# Patient Record
Sex: Female | Born: 1980 | Race: White | Hispanic: No | Marital: Married | Smoking: Current every day smoker
Health system: Southern US, Community
[De-identification: ages and names within clinical notes are randomized; demographics above are authoritative.]

## PROBLEM LIST (undated history)

## (undated) ENCOUNTER — Inpatient Hospital Stay (HOSPITAL_COMMUNITY): Payer: Self-pay

## (undated) DIAGNOSIS — D649 Anemia, unspecified: Secondary | ICD-10-CM

## (undated) DIAGNOSIS — C8590 Non-Hodgkin lymphoma, unspecified, unspecified site: Secondary | ICD-10-CM

## (undated) DIAGNOSIS — C8190 Hodgkin lymphoma, unspecified, unspecified site: Secondary | ICD-10-CM

## (undated) DIAGNOSIS — C801 Malignant (primary) neoplasm, unspecified: Secondary | ICD-10-CM

## (undated) DIAGNOSIS — R635 Abnormal weight gain: Secondary | ICD-10-CM

## (undated) DIAGNOSIS — N390 Urinary tract infection, site not specified: Secondary | ICD-10-CM

## (undated) DIAGNOSIS — O99213 Obesity complicating pregnancy, third trimester: Secondary | ICD-10-CM

## (undated) DIAGNOSIS — Z5189 Encounter for other specified aftercare: Secondary | ICD-10-CM

## (undated) DIAGNOSIS — R011 Cardiac murmur, unspecified: Secondary | ICD-10-CM

## (undated) DIAGNOSIS — R1310 Dysphagia, unspecified: Secondary | ICD-10-CM

## (undated) DIAGNOSIS — O139 Gestational [pregnancy-induced] hypertension without significant proteinuria, unspecified trimester: Secondary | ICD-10-CM

## (undated) HISTORY — PX: CHOLECYSTECTOMY: SHX55

## (undated) HISTORY — DX: Abnormal weight gain: R63.5

## (undated) HISTORY — DX: Anemia, unspecified: D64.9

## (undated) HISTORY — PX: RHINOPLASTY: SUR1284

## (undated) HISTORY — PX: APPENDECTOMY: SHX54

## (undated) HISTORY — PX: OTHER SURGICAL HISTORY: SHX169

## (undated) HISTORY — DX: Dysphagia, unspecified: R13.10

## (undated) HISTORY — DX: Non-Hodgkin lymphoma, unspecified, unspecified site: C85.90

## (undated) HISTORY — PX: PORT-A-CATH REMOVAL: SHX5289

## (undated) HISTORY — PX: TONSILLECTOMY: SUR1361

## (undated) HISTORY — PX: CHEST WALL TUMOR EXCISION: SUR562

---

## 2002-06-11 ENCOUNTER — Ambulatory Visit (HOSPITAL_COMMUNITY): Admission: AD | Admit: 2002-06-11 | Discharge: 2002-06-12 | Payer: Self-pay | Admitting: Obstetrics & Gynecology

## 2008-02-10 HISTORY — PX: WISDOM TOOTH EXTRACTION: SHX21

## 2008-11-24 DIAGNOSIS — C8190 Hodgkin lymphoma, unspecified, unspecified site: Secondary | ICD-10-CM

## 2008-11-24 HISTORY — DX: Hodgkin lymphoma, unspecified, unspecified site: C81.90

## 2009-01-25 DIAGNOSIS — Z5189 Encounter for other specified aftercare: Secondary | ICD-10-CM

## 2009-01-25 DIAGNOSIS — IMO0001 Reserved for inherently not codable concepts without codable children: Secondary | ICD-10-CM

## 2009-01-25 HISTORY — DX: Reserved for inherently not codable concepts without codable children: IMO0001

## 2009-01-25 HISTORY — DX: Encounter for other specified aftercare: Z51.89

## 2009-04-29 ENCOUNTER — Other Ambulatory Visit: Payer: Self-pay

## 2010-02-09 DIAGNOSIS — C8590 Non-Hodgkin lymphoma, unspecified, unspecified site: Secondary | ICD-10-CM

## 2010-02-09 HISTORY — DX: Non-Hodgkin lymphoma, unspecified, unspecified site: C85.90

## 2010-05-14 ENCOUNTER — Inpatient Hospital Stay (HOSPITAL_COMMUNITY)
Admission: AD | Admit: 2010-05-14 | Discharge: 2010-05-15 | Disposition: A | Discharge status: Home / Self Care | Payer: Medicaid Other | Source: Ambulatory Visit | Attending: Obstetrics & Gynecology | Admitting: Obstetrics & Gynecology

## 2010-05-14 DIAGNOSIS — O99891 Other specified diseases and conditions complicating pregnancy: Secondary | ICD-10-CM | POA: Insufficient documentation

## 2010-05-14 DIAGNOSIS — R109 Unspecified abdominal pain: Secondary | ICD-10-CM | POA: Insufficient documentation

## 2010-06-24 ENCOUNTER — Inpatient Hospital Stay (HOSPITAL_COMMUNITY)
Admission: AD | Admit: 2010-06-24 | Discharge: 2010-06-25 | Disposition: A | Discharge status: Home / Self Care | Payer: Medicaid Other | Source: Ambulatory Visit | Attending: Obstetrics and Gynecology | Admitting: Obstetrics and Gynecology

## 2010-06-24 DIAGNOSIS — O4700 False labor before 37 completed weeks of gestation, unspecified trimester: Secondary | ICD-10-CM | POA: Insufficient documentation

## 2011-01-12 ENCOUNTER — Inpatient Hospital Stay (HOSPITAL_COMMUNITY): Payer: Self-pay

## 2011-01-12 ENCOUNTER — Encounter (HOSPITAL_COMMUNITY): Payer: Self-pay

## 2011-01-12 ENCOUNTER — Inpatient Hospital Stay (HOSPITAL_COMMUNITY)
Admission: AD | Admit: 2011-01-12 | Discharge: 2011-01-12 | Disposition: A | Discharge status: Home / Self Care | Payer: Self-pay | Source: Ambulatory Visit | Attending: Obstetrics & Gynecology | Admitting: Obstetrics & Gynecology

## 2011-01-12 DIAGNOSIS — O200 Threatened abortion: Secondary | ICD-10-CM | POA: Insufficient documentation

## 2011-01-12 HISTORY — DX: Encounter for other specified aftercare: Z51.89

## 2011-01-12 HISTORY — DX: Non-Hodgkin lymphoma, unspecified, unspecified site: C85.90

## 2011-01-12 LAB — COMPREHENSIVE METABOLIC PANEL
ALT: 16 U/L (ref 0–35)
BUN: 11 mg/dL (ref 6–23)
CO2: 21 mEq/L (ref 19–32)
Calcium: 9.5 mg/dL (ref 8.4–10.5)
Creatinine, Ser: 0.56 mg/dL (ref 0.50–1.10)
GFR calc Af Amer: >90 mL/min (ref >90)
GFR calc non Af Amer: >90 mL/min (ref >90)
Glucose, Bld: 81 mg/dL (ref 70–99)

## 2011-01-12 LAB — WET PREP, GENITAL
Trich, Wet Prep: NONE SEEN
Yeast Wet Prep HPF POC: NONE SEEN

## 2011-01-12 LAB — URINALYSIS, ROUTINE W REFLEX MICROSCOPIC
Glucose, UA: NEGATIVE mg/dL
Ketones, ur: NEGATIVE mg/dL
Protein, ur: NEGATIVE mg/dL
Urobilinogen, UA: 0.2 mg/dL (ref 0.0–1.0)

## 2011-01-12 LAB — URINE MICROSCOPIC-ADD ON

## 2011-01-12 LAB — CBC
HCT: 33.9 % — ABNORMAL LOW (ref 36.0–46.0)
MCH: 26.6 pg (ref 26.0–34.0)
MCHC: 32.2 g/dL (ref 30.0–36.0)
MCV: 82.7 fL (ref 78.0–100.0)
RDW: 16.8 % — ABNORMAL HIGH (ref 11.5–15.5)

## 2011-01-12 MED ORDER — OXYCODONE-ACETAMINOPHEN 5-325 MG PO TABS: 2.0000 | ORAL_TABLET | Freq: Once | ORAL | Status: AC

## 2011-01-12 MED ORDER — OXYCODONE-ACETAMINOPHEN 5-325 MG PO TABS
2.0000 | ORAL_TABLET | Freq: Once | ORAL | Status: AC
  Administered 2011-01-12: 2 via ORAL
  Filled 2011-01-12: qty 2

## 2011-01-12 NOTE — Progress Notes (Signed)
Pt states abd pain started at 1200, generalized, back pain noted for 2 weeks, urine has foul odor, denies uti s/s. Told had elevated liver function tests. Hx Non-Hodgkin's Lymphoma.

## 2011-01-12 NOTE — ED Provider Notes (Signed)
History     Chief Complaint  Patient presents with  . Abdominal Pain   HPI 30 y.o. [id number] at [redacted]w[redacted]d. Abd pain - starts in upper abdomen and radiates around bilaterally to low abdomen, started at noon today, low back pain x 1 week. No vaginal bleeding, + mucous discharge. + nausea since yesterday. Has been seen by Dr. Shawnie Pons in [facility], states he has seen a gestational sac with yolk sac on u/s, has f/u scheduled for repeat u/s.     Past Medical History  Diagnosis Date  . Non-Hodgkin lymphoma   . Blood transfusion 01/25/2009    Past Surgical History  Procedure Date  . Appendectomy   . Tonsillectomy   . Cholecystectomy   . Rhinoplasty     No family history on file.  History  Substance Use Topics  . Smoking status: Current Everyday Smoker  . Smokeless tobacco: Not on file  . Alcohol Use: No    Allergies: No Known Allergies  Prescriptions prior to admission  Medication Sig Dispense Refill  . acetaminophen (TYLENOL) 325 MG tablet Take by mouth every 6 (six) hours as needed. Patient used this medication for pain.         Review of Systems  Constitutional: Negative.   Respiratory: Negative.   Cardiovascular: Negative.   Gastrointestinal: Positive for nausea, vomiting and abdominal pain. Negative for diarrhea and constipation.  Genitourinary: Negative for dysuria, urgency, frequency, hematuria and flank pain.       Negative for vaginal bleeding, Positive for vaginal discharge   Musculoskeletal: Negative.   Neurological: Negative.   Psychiatric/Behavioral: Negative.    Physical Exam   Blood pressure 134/70, pulse 81, temperature 98.2 F (36.8 C), temperature source Oral, resp. rate 16, height 5\' 2"  (1.575 m), weight 121.791 kg (268 lb 8 oz), last menstrual period 11/14/2010.  Physical Exam  Nursing note and vitals reviewed. Constitutional: She is oriented to person, place, and time. She appears well-developed and well-nourished. No distress.  HENT:  Head:  Normocephalic and atraumatic.  Cardiovascular: Normal rate, regular rhythm and normal heart sounds.   Respiratory: Effort normal and breath sounds normal. No respiratory distress.  GI: Soft. Bowel sounds are normal. She exhibits no distension and no mass. There is tenderness (diffuse). There is no rebound and no guarding.  Genitourinary: There is no rash or lesion on the right labia. There is no rash or lesion on the left labia. No erythema, tenderness or bleeding around the vagina. Vaginal discharge (white) found.       Exam limited by body habitus  Neurological: She is alert and oriented to person, place, and time.  Skin: Skin is warm and dry.  Psychiatric: She has a normal mood and affect.    MAU Course  Procedures  Results for orders placed during the hospital encounter of 01/12/11 (from the past 24 hour(s))  URINALYSIS, ROUTINE W REFLEX MICROSCOPIC     Status: Abnormal   Collection Time   01/12/11  5:11 PM      Component Value Range   Color, Urine YELLOW  YELLOW    APPearance HAZY (*) CLEAR    Specific Gravity, Urine >1.030 (*) 1.005 - 1.030    pH 6.0  5.0 - 8.0    Glucose, UA NEGATIVE  NEGATIVE (mg/dL)   Hgb urine dipstick SMALL (*) NEGATIVE    Bilirubin Urine NEGATIVE  NEGATIVE    Ketones, ur NEGATIVE  NEGATIVE (mg/dL)   Protein, ur NEGATIVE  NEGATIVE (mg/dL)   Urobilinogen, UA  0.2  0.0 - 1.0 (mg/dL)   Nitrite NEGATIVE  NEGATIVE    Leukocytes, UA TRACE (*) NEGATIVE   URINE MICROSCOPIC-ADD ON     Status: Abnormal   Collection Time   01/12/11  5:11 PM      Component Value Range   Squamous Epithelial / LPF MANY (*) RARE    WBC, UA 7-10  <3 (WBC/hpf)   Bacteria, UA FEW (*) RARE    Urine-Other MUCOUS PRESENT    POCT PREGNANCY, URINE     Status: Normal   Collection Time   01/12/11  6:01 PM      Component Value Range   Preg Test, Ur POSITIVE    WET PREP, GENITAL     Status: Abnormal   Collection Time   01/12/11  6:27 PM      Component Value Range   Yeast, Wet Prep NONE  SEEN  NONE SEEN    Trich, Wet Prep NONE SEEN  NONE SEEN    Clue Cells, Wet Prep NONE SEEN  NONE SEEN    WBC, Wet Prep HPF POC FEW (*) NONE SEEN   CBC     Status: Abnormal   Collection Time   01/12/11  7:12 PM      Component Value Range   WBC 9.4  4.0 - 10.5 (K/uL)   RBC 4.10  3.87 - 5.11 (MIL/uL)   Hemoglobin 10.9 (*) 12.0 - 15.0 (g/dL)   HCT 40.9 (*) 81.1 - 46.0 (%)   MCV 82.7  78.0 - 100.0 (fL)   MCH 26.6  26.0 - 34.0 (pg)   MCHC 32.2  30.0 - 36.0 (g/dL)   RDW 91.4 (*) 78.2 - 15.5 (%)   Platelets 335  150 - 400 (K/uL)  ABO/RH     Status: Normal   Collection Time   01/12/11  7:12 PM      Component Value Range   ABO/RH(D) A POS    HCG, QUANTITATIVE, PREGNANCY     Status: Abnormal   Collection Time   01/12/11  7:12 PM      Component Value Range   hCG, Beta Chain, Quant, S 95621 (*) <5 (mIU/mL)  COMPREHENSIVE METABOLIC PANEL     Status: Normal   Collection Time   01/12/11  7:12 PM      Component Value Range   Sodium 135  135 - 145 (mEq/L)   Potassium 3.6  3.5 - 5.1 (mEq/L)   Chloride 101  96 - 112 (mEq/L)   CO2 21  19 - 32 (mEq/L)   Glucose, Bld 81  70 - 99 (mg/dL)   BUN 11  6 - 23 (mg/dL)   Creatinine, Ser 3.08  0.50 - 1.10 (mg/dL)   Calcium 9.5  8.4 - 65.7 (mg/dL)   Total Protein 7.4  6.0 - 8.3 (g/dL)   Albumin 3.9  3.5 - 5.2 (g/dL)   AST 15  0 - 37 (U/L)   ALT 16  0 - 35 (U/L)   Alkaline Phosphatase 78  39 - 117 (U/L)   Total Bilirubin 0.3  0.3 - 1.2 (mg/dL)   GFR calc non Af Amer >90  >90 (mL/min)   GFR calc Af Amer >90  >90 (mL/min)   US Ob Comp Less 14 Wks  01/12/2011  *RADIOLOGY REPORT*  Clinical Data: Abdominal pain.  8 weeks and 3 days pregnant by last menstrual period.  No quantitative beta HCG available at this time. 7 weeks and 5 days pregnant by previous outside ultrasound.  OBSTETRIC <14 WK [place] AND TRANSVAGINAL OB US  Technique:  Both transabdominal and transvaginal ultrasound examinations were performed for complete evaluation of the gestation as well as the  maternal uterus, adnexal regions, and pelvic cul-de-sac.  Transvaginal technique was performed to assess early pregnancy.  Comparison:  None.  Intrauterine gestational sac:  Visualized/normal in shape. Yolk sac: Small amount of debris within the gestational sac without a definite yolk sac visualized. Embryo: Small amount of debris within the gestational sac without a definitive fetal pole. Cardiac Activity: Not visualized.  MSD: 25.8  mm  7    w 5    d        [place] EDC: 08/26/2011  Maternal uterus/adnexae: Normal appearing maternal ovaries.  No subchorionic hemorrhage seen.  No free peritoneal fluid.  IMPRESSION: Intrauterine gestational sac containing a small amount of debris without a definitive fetal pole or yolk sac.  The estimated gestational age by sac diameter is 7 weeks and 5 days, which correlates with the gestational age by previous outside ultrasound. These findings are most compatible with fetal demise.  A non- visualized ectopic pregnancy is unlikely.  Follow-up serial quantitative beta HCG determinations is recommended.  If these rise, a follow-up pelvic ultrasound would be recommended in 2 weeks.  Original Report Authenticated By: Darrol Angel, M.D.   US Ob Transvaginal  01/12/2011  *RADIOLOGY REPORT*  Clinical Data: Abdominal pain.  8 weeks and 3 days pregnant by last menstrual period.  No quantitative beta HCG available at this time. 7 weeks and 5 days pregnant by previous outside ultrasound.  OBSTETRIC <14 WK [place] AND TRANSVAGINAL OB US  Technique:  Both transabdominal and transvaginal ultrasound examinations were performed for complete evaluation of the gestation as well as the maternal uterus, adnexal regions, and pelvic cul-de-sac.  Transvaginal technique was performed to assess early pregnancy.  Comparison:  None.  Intrauterine gestational sac:  Visualized/normal in shape. Yolk sac: Small amount of debris within the gestational sac without a definite yolk sac visualized. Embryo: Small amount of  debris within the gestational sac without a definitive fetal pole. Cardiac Activity: Not visualized.  MSD: 25.8  mm  7    w 5    d        [place] EDC: 08/26/2011  Maternal uterus/adnexae: Normal appearing maternal ovaries.  No subchorionic hemorrhage seen.  No free peritoneal fluid.  IMPRESSION: Intrauterine gestational sac containing a small amount of debris without a definitive fetal pole or yolk sac.  The estimated gestational age by sac diameter is 7 weeks and 5 days, which correlates with the gestational age by previous outside ultrasound. These findings are most compatible with fetal demise.  A non- visualized ectopic pregnancy is unlikely.  Follow-up serial quantitative beta HCG determinations is recommended.  If these rise, a follow-up pelvic ultrasound would be recommended in 2 weeks.  Original Report Authenticated By: Darrol Angel, M.D.   Assessment and Plan  30 y.o. [id number] at [redacted]w[redacted]d Threatened miscarriage - pt has follow up scheduled on Wednesday with Dr. Shawnie Pons, she desires to keep this appointment for f/u, will return here with excessive pain or bleeding   Langford Carias 01/12/2011, 8:00 PM

## 2011-01-21 NOTE — ED Provider Notes (Signed)
Agree with above note.  Cynthia Evelyn H. 01/21/2011 12:45 PM  

## 2011-05-17 ENCOUNTER — Emergency Department (HOSPITAL_COMMUNITY): Payer: Medicaid Other

## 2011-05-17 ENCOUNTER — Other Ambulatory Visit: Payer: Self-pay

## 2011-05-17 ENCOUNTER — Encounter (HOSPITAL_COMMUNITY): Payer: Self-pay | Admitting: *Deleted

## 2011-05-17 ENCOUNTER — Emergency Department (HOSPITAL_COMMUNITY)
Admission: EM | Admit: 2011-05-17 | Discharge: 2011-05-17 | Disposition: A | Discharge status: Home / Self Care | Payer: Medicaid Other | Attending: Emergency Medicine | Admitting: Emergency Medicine

## 2011-05-17 DIAGNOSIS — O2690 Pregnancy related conditions, unspecified, unspecified trimester: Secondary | ICD-10-CM | POA: Insufficient documentation

## 2011-05-17 DIAGNOSIS — R079 Chest pain, unspecified: Secondary | ICD-10-CM | POA: Insufficient documentation

## 2011-05-17 DIAGNOSIS — Z3201 Encounter for pregnancy test, result positive: Secondary | ICD-10-CM

## 2011-05-17 LAB — CBC
MCV: 80 fL (ref 78.0–100.0)
Platelets: 327 10*3/uL (ref 150–400)
RDW: 16.3 % — ABNORMAL HIGH (ref 11.5–15.5)
WBC: 8.4 10*3/uL (ref 4.0–10.5)

## 2011-05-17 LAB — URINALYSIS, ROUTINE W REFLEX MICROSCOPIC
Hgb urine dipstick: NEGATIVE
Protein, ur: NEGATIVE mg/dL
Urobilinogen, UA: 1 mg/dL (ref 0.0–1.0)

## 2011-05-17 LAB — BASIC METABOLIC PANEL
CO2: 25 mEq/L (ref 19–32)
Calcium: 8.9 mg/dL (ref 8.4–10.5)
GFR calc Af Amer: >90 mL/min (ref >90)
GFR calc non Af Amer: >90 mL/min (ref >90)
Sodium: 138 mEq/L (ref 135–145)

## 2011-05-17 LAB — DIFFERENTIAL
Basophils Absolute: 0 10*3/uL (ref 0.0–0.1)
Eosinophils Absolute: 0.2 10*3/uL (ref 0.0–0.7)
Eosinophils Relative: 2 % (ref 0–5)
Lymphocytes Relative: 26 % (ref 12–46)

## 2011-05-17 LAB — URINE MICROSCOPIC-ADD ON

## 2011-05-17 LAB — POCT I-STAT TROPONIN I: Troponin i, poc: 0 ng/mL (ref 0.00–0.08)

## 2011-05-17 LAB — PREGNANCY, URINE: Preg Test, Ur: POSITIVE — AB

## 2011-05-17 MED ORDER — HYDROCODONE-ACETAMINOPHEN 5-325 MG PO TABS: 1.0000 | ORAL_TABLET | Freq: Four times a day (QID) | ORAL | Status: AC | PRN

## 2011-05-17 MED ORDER — ONDANSETRON HCL 4 MG/2ML IJ SOLN
4.0000 mg | Freq: Once | INTRAMUSCULAR | Status: AC
  Administered 2011-05-17: 4 mg via INTRAVENOUS
  Filled 2011-05-17: qty 2

## 2011-05-17 MED ORDER — SODIUM CHLORIDE 0.9 % IV SOLN
Freq: Once | INTRAVENOUS | Status: AC
  Administered 2011-05-17: 20 mL/h via INTRAVENOUS

## 2011-05-17 MED ORDER — TECHNETIUM TO 99M ALBUMIN AGGREGATED
3.0000 | Freq: Once | INTRAVENOUS | Status: AC | PRN
  Administered 2011-05-17: 3 via INTRAVENOUS

## 2011-05-17 MED ORDER — FENTANYL CITRATE 0.05 MG/ML IJ SOLN
50.0000 ug | Freq: Once | INTRAMUSCULAR | Status: AC
  Administered 2011-05-17: 50 ug via INTRAVENOUS
  Filled 2011-05-17: qty 2

## 2011-05-17 NOTE — Discharge Instructions (Signed)
Chest Pain (Nonspecific) It is often hard to give a specific diagnosis for the cause of chest pain. There is always a chance that your pain could be related to something serious, such as a heart attack or a blood clot in the lungs. You need to follow up with your caregiver for further evaluation. CAUSES   Heartburn.   Pneumonia or bronchitis.   Anxiety or stress.   Inflammation around your heart (pericarditis) or lung (pleuritis or pleurisy).   A blood clot in the lung.   A collapsed lung (pneumothorax). It can develop suddenly on its own (spontaneous pneumothorax) or from injury (trauma) to the chest.   Shingles infection (herpes zoster virus).  The chest wall is composed of bones, muscles, and cartilage. Any of these can be the source of the pain.  The bones can be bruised by injury.   The muscles or cartilage can be strained by coughing or overwork.   The cartilage can be affected by inflammation and become sore (costochondritis).  DIAGNOSIS  Lab tests or other studies, such as X-rays, electrocardiography, stress testing, or cardiac imaging, may be needed to find the cause of your pain.  TREATMENT   Treatment depends on what may be causing your chest pain. Treatment may include:   Acid blockers for heartburn.   Anti-inflammatory medicine.   Pain medicine for inflammatory conditions.   Antibiotics if an infection is present.   You may be advised to change lifestyle habits. This includes stopping smoking and avoiding alcohol, caffeine, and chocolate.   You may be advised to keep your head raised (elevated) when sleeping. This reduces the chance of acid going backward from your stomach into your esophagus.   Most of the time, nonspecific chest pain will improve within 2 to 3 days with rest and mild pain medicine.  HOME CARE INSTRUCTIONS   If antibiotics were prescribed, take your antibiotics as directed. Finish them even if you start to feel better.   For the next few  days, avoid physical activities that bring on chest pain. Continue physical activities as directed.   Do not smoke.   Avoid drinking alcohol.   Only take over-the-counter or prescription medicine for pain, discomfort, or fever as directed by your caregiver.   Follow your caregiver's suggestions for further testing if your chest pain does not go away.   Keep any follow-up appointments you made. If you do not go to an appointment, you could develop lasting (chronic) problems with pain. If there is any problem keeping an appointment, you must call to reschedule.  SEEK MEDICAL CARE IF:   You think you are having problems from the medicine you are taking. Read your medicine instructions carefully.   Your chest pain does not go away, even after treatment.   You develop a rash with blisters on your chest.  SEEK IMMEDIATE MEDICAL CARE IF:   You have increased chest pain or pain that spreads to your arm, neck, jaw, back, or abdomen.   You develop shortness of breath, an increasing cough, or you are coughing up blood.   You have severe back or abdominal pain, feel nauseous, or vomit.   You develop severe weakness, fainting, or chills.   You have a fever.  THIS IS AN EMERGENCY. Do not wait to see if the pain will go away. Get medical help at once. Call your local emergency services (911 in [place] Do not drive yourself to the hospital. MAKE SURE YOU:   Understand these instructions.     Will watch your condition.   Will get help right away if you are not doing well or get worse.  Document Released: 11/05/2004 Document Revised: 01/15/2011 Document Reviewed: 09/01/2007 [employer] Patient Information 2012 [employer]. 

## 2011-05-17 NOTE — ED Provider Notes (Signed)
History     CSN: [id number]  Arrival date & time 05/17/11  1100   First MD Initiated Contact with Patient 05/17/11 1203      12:22 PM HPI Patient reports chest pain that has been progressive for 4-5 months. States pain feels like prior "cancer" pain. States 11/24/08 started Chemo but had to stop in 07/2009 because she became pregnant, (just prior to radiation therapy). Reports symptoms are associated with diarrhea, extreme fatique, SOB, intermittent claudication and nausea. Denies weight loss, abdominal pain, back pain, vomiting, bleeding. Reports that she use to follow Dr. Keane Scrape with [facility].   Patient is a 31 y.o. female presenting with chest pain. The history is provided by the patient.  Chest Pain Episode onset: 4-5 months. Chest pain occurs constantly. The chest pain is worsening. The pain is currently at 8/10. The severity of the pain is severe. The quality of the pain is described as similar to previous episodes. The pain does not radiate. Primary symptoms include fatigue, shortness of breath and nausea. Pertinent negatives for primary symptoms include no fever, no syncope, no cough, no wheezing, no palpitations, no abdominal pain, no vomiting, no dizziness and no altered mental status.  Pertinent negatives for associated symptoms include no diaphoresis, no numbness and no weakness. Risk factors include obesity.  Her past medical history is significant for cancer, DVT and PE.  Pertinent negatives for past medical history include no hyperlipidemia, no hypertension, no MI and no TIA.     Past Medical History  Diagnosis Date  . Non-Hodgkin lymphoma   . Blood transfusion 01/25/2009  . Hypertension     Past Surgical History  Procedure Date  . Appendectomy   . Tonsillectomy   . Cholecystectomy   . Rhinoplasty   . Port-a-cath removal   . Chest wall tumor excision     No family history on file.  History  Substance Use Topics  . Smoking status: Current Everyday Smoker  .  Smokeless tobacco: Not on file  . Alcohol Use: No    OB History    Grav Para Term Preterm Abortions TAB SAB Ect Mult Living   6 5 5  0 0 0 0 0 0 5      Review of Systems  Constitutional: Positive for fatigue. Negative for fever, diaphoresis and unexpected weight change.  HENT: Negative for neck pain.   Respiratory: Positive for shortness of breath. Negative for cough, chest tightness and wheezing.   Cardiovascular: Positive for chest pain. Negative for palpitations, leg swelling and syncope.  Gastrointestinal: Positive for nausea and diarrhea. Negative for vomiting and abdominal pain.  Neurological: Negative for dizziness, speech difficulty, weakness, numbness and headaches.  Psychiatric/Behavioral: Negative for altered mental status.  All other systems reviewed and are negative.    Allergies  Review of patient's allergies indicates no known allergies.  Home Medications  No current outpatient prescriptions on file.  BP 114/50  Pulse 71  Temp(Src) 98.1 F (36.7 C) (Oral)  Resp 16  SpO2 99%  LMP 04/12/2011  Breastfeeding? Unknown  Physical Exam  Vitals reviewed. Constitutional: She is oriented to person, place, and time. Vital signs are normal. She appears well-developed and well-nourished.  HENT:  Head: Normocephalic and atraumatic.  Eyes: Conjunctivae are normal. Pupils are equal, round, and reactive to light.  Neck: Normal range of motion. Neck supple.  Cardiovascular: Normal rate, regular rhythm and normal heart sounds.  Exam reveals no friction rub.   No murmur heard. Pulmonary/Chest: Effort normal and breath sounds normal. She  has no wheezes. She has no rhonchi. She has no rales. She exhibits no tenderness.  Abdominal: Soft. Bowel sounds are normal. She exhibits no distension and no mass. There is no tenderness. There is no rebound and no guarding.  Musculoskeletal: Normal range of motion.  Neurological: She is alert and oriented to person, place, and time.  Coordination normal.  Skin: Skin is warm and dry. No rash noted. No erythema. No pallor.    ED Course  Procedures  Results for orders placed during the hospital encounter of 05/17/11  PREGNANCY, URINE      Component Value Range   Preg Test, Ur POSITIVE (*) NEGATIVE   URINALYSIS, ROUTINE W REFLEX MICROSCOPIC      Component Value Range   Color, Urine YELLOW  YELLOW    APPearance CLOUDY (*) CLEAR    Specific Gravity, Urine 1.025  1.005 - 1.030    pH 7.0  5.0 - 8.0    Glucose, UA NEGATIVE  NEGATIVE (mg/dL)   Hgb urine dipstick NEGATIVE  NEGATIVE    Bilirubin Urine NEGATIVE  NEGATIVE    Ketones, ur NEGATIVE  NEGATIVE (mg/dL)   Protein, ur NEGATIVE  NEGATIVE (mg/dL)   Urobilinogen, UA 1.0  0.0 - 1.0 (mg/dL)   Nitrite NEGATIVE  NEGATIVE    Leukocytes, UA MODERATE (*) NEGATIVE   CBC      Component Value Range   WBC 8.4  4.0 - 10.5 (K/uL)   RBC 4.36  3.87 - 5.11 (MIL/uL)   Hemoglobin 11.0 (*) 12.0 - 15.0 (g/dL)   HCT 56.3 (*) 87.5 - 46.0 (%)   MCV 80.0  78.0 - 100.0 (fL)   MCH 25.2 (*) 26.0 - 34.0 (pg)   MCHC 31.5  30.0 - 36.0 (g/dL)   RDW 64.3 (*) 32.9 - 15.5 (%)   Platelets 327  150 - 400 (K/uL)  DIFFERENTIAL      Component Value Range   Neutrophils Relative 65  43 - 77 (%)   Neutro Abs 5.5  1.7 - 7.7 (K/uL)   Lymphocytes Relative 26  12 - 46 (%)   Lymphs Abs 2.2  0.7 - 4.0 (K/uL)   Monocytes Relative 7  3 - 12 (%)   Monocytes Absolute 0.6  0.1 - 1.0 (K/uL)   Eosinophils Relative 2  0 - 5 (%)   Eosinophils Absolute 0.2  0.0 - 0.7 (K/uL)   Basophils Relative 0  0 - 1 (%)   Basophils Absolute 0.0  0.0 - 0.1 (K/uL)  BASIC METABOLIC PANEL      Component Value Range   Sodium 138  135 - 145 (mEq/L)   Potassium 4.1  3.5 - 5.1 (mEq/L)   Chloride 104  96 - 112 (mEq/L)   CO2 25  19 - 32 (mEq/L)   Glucose, Bld 87  70 - 99 (mg/dL)   BUN 15  6 - 23 (mg/dL)   Creatinine, Ser 5.18  0.50 - 1.10 (mg/dL)   Calcium 8.9  8.4 - 84.1 (mg/dL)   GFR calc non Af Amer >90  >90 (mL/min)   GFR  calc Af Amer >90  >90 (mL/min)  HCG, QUANTITATIVE, PREGNANCY      Component Value Range   hCG, Beta Chain, Quant, S 91 (*) <5 (mIU/mL)  URINE MICROSCOPIC-ADD ON      Component Value Range   Squamous Epithelial / LPF FEW (*) RARE    WBC, UA 11-20  <3 (WBC/hpf)   Bacteria, UA MANY (*) RARE   POCT  I-STAT TROPONIN I      Component Value Range   Troponin i, poc 0.00  0.00 - 0.08 (ng/mL)   Comment 3            Dg Chest 2 View  05/17/2011  *RADIOLOGY REPORT*  Clinical Data: Chest pain, pregnant  CHEST - 2 VIEW  Comparison: None.  Findings: Cardiomediastinal silhouette is unremarkable.  No acute infiltrate or pleural effusion.  No pulmonary edema. Mild thoracic dextroscoliosis.  The patient's abdomen was double shielded for examination.  IMPRESSION: No active disease.  Original Report Authenticated By: Natasha Mead, M.D.    Date: 05/17/2011  Rate: 70  Rhythm: normal sinus rhythm  QRS Axis: normal  Intervals: normal  ST/T Wave abnormalities: normal  Conduction Disutrbances: none      MDM   VQ scan was normal. Advised close followup with OB/GYN since bHcG was 91. Also advised close followup with [facility] Dr. for further evaluation of Hodgkin's lymphoma. Provide patient with few tablets of hydrocodone for pain if needed. Patient agrees with plan and is ready for discharge.     Thomasene Lot, PA-C 05/17/11 1847

## 2011-05-17 NOTE — ED Provider Notes (Signed)
Medical screening examination/treatment/procedure(s) were conducted as a shared visit with non-physician practitioner(s) and myself.  I personally evaluated the patient during the encounter  The patient presents with complaints of chest pain.  She has complicated history of lymphoma and PE.  Pt states she was pregnant in February and had a miscarriage.  She was treated with methotrexate.  She did not know that she was pregnant again.  She has not been using protection.    I doubt that the serum pregnancy would be positive now with her miscarriage in February.  We will check a quant.  Continue her chest pain evaluation.  Celene Kras, MD 05/17/11 1357

## 2011-05-17 NOTE — ED Notes (Signed)
Pt reports she feels dehydrated. Pt has history of Hodgkin's Lymphoma and had to stop treatment for pregnancy. Pt reports not being treated for over a year and was going to return to treatment, but believes she may be pregnant again.  Pt reports chest tightness has been going on for a month, worse in the past few days.  Pt reports pain is intermittent and lasting for a few seconds, pain is a squeezing sensation and pt reports she feels like she has palpitations as well.  Pt reports multiple episodes a day.

## 2011-05-19 NOTE — ED Provider Notes (Signed)
Medical screening examination/treatment/procedure(s) were performed by non-physician practitioner and as supervising physician I was immediately available for consultation/collaboration.  Celene Kras, MD 05/19/11 [phone]

## 2011-05-27 ENCOUNTER — Emergency Department (HOSPITAL_COMMUNITY): Admission: EM | Admit: 2011-05-27 | Discharge: 2011-05-27 | Discharge status: Against Medical Advice | Payer: Self-pay

## 2011-05-27 NOTE — ED Notes (Signed)
Pt reports that she was told a few weeks ago that she was pregnant and began having spotting yesterday and bleeding more heavily today.

## 2011-05-27 NOTE — ED Notes (Signed)
Pt called for triage x2. No response. Not found in waiting room

## 2011-05-27 NOTE — ED Notes (Signed)
Pt called for triage x1 with no response. Not found in waiting area.

## 2011-07-01 ENCOUNTER — Inpatient Hospital Stay (HOSPITAL_COMMUNITY)
Admission: AD | Admit: 2011-07-01 | Discharge: 2011-07-01 | Disposition: A | Discharge status: Home / Self Care | Payer: Self-pay | Source: Ambulatory Visit | Attending: Obstetrics & Gynecology | Admitting: Obstetrics & Gynecology

## 2011-07-01 ENCOUNTER — Encounter (HOSPITAL_COMMUNITY): Payer: Self-pay | Admitting: *Deleted

## 2011-07-01 DIAGNOSIS — Z3202 Encounter for pregnancy test, result negative: Secondary | ICD-10-CM | POA: Insufficient documentation

## 2011-07-01 HISTORY — DX: Malignant (primary) neoplasm, unspecified: C80.1

## 2011-07-01 HISTORY — DX: Hodgkin lymphoma, unspecified, unspecified site: C81.90

## 2011-07-01 NOTE — MAU Provider Note (Signed)
  History     CSN: [id number]  Arrival date and time: 07/01/11 1030   First Provider Initiated Contact with Patient 07/01/11 1133      Chief Complaint  Patient presents with  . Possible Pregnancy   HPI Cynthia Scott 31 y.o. Comes to MAU as she had a positive home pregnancy test yesterday.  No pain.  No bleeding.  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  Usually sees Dr. Shawnie Pons in [facility].  Has lost her Medicaid.  OB History    Grav Para Term Preterm Abortions TAB SAB Ect Mult Living   9 5 5  4 4    5       Past Medical History  Diagnosis Date  . 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 Cancer     Past Surgical History  Procedure Date  . Tonsillectomy   . Wisdom tooth extraction 2010  . Appendectomy   . Cholecystectomy   . Tubes in ears   . Turmor in chest     Family History  Problem Relation Age of Onset  . Anesthesia problems Neg Hx     History  Substance Use Topics  . Smoking status: Current Some Day Smoker -- 0.5 packs/day  . Smokeless tobacco: Not on file  . Alcohol Use: No     pt states she stopped in 2006    Allergies: No Known Allergies  No prescriptions prior to admission    ROS Physical Exam   Blood pressure 114/69, pulse 97, temperature 98.3 F (36.8 C), temperature source Oral, resp. rate 18, height 5' 1.5" (1.562 m), weight 275 lb 3.2 oz (124.83 kg), last menstrual period 05/27/2011, SpO2 100.00%.  Physical Exam  Nursing note and vitals reviewed. Constitutional: She is oriented to person, place, and time. She appears well-developed and well-nourished.  HENT:  Head: Normocephalic.  Eyes: EOM are normal.  Neck: Neck supple.  Musculoskeletal: Normal range of motion.  Neurological: She is alert and oriented to person, place, and time.  Skin: Skin is warm and dry.  Psychiatric: She has a normal mood and affect.    MAU Course  Procedures  MDM Results for orders placed during the hospital encounter of 07/01/11  (from the past 24 hour(s))  POCT PREGNANCY, URINE     Status: Normal   Collection Time   07/01/11 10:51 AM      Component Value Range   Preg Test, Ur NEGATIVE  NEGATIVE     Assessment and Plan  Negative pregnancy test  Plan Follow up with Dr. Shawnie Pons Condoms always for intercourse Repeat pregnancy test in 2 weeks if no menses.  Cynthia Scott 07/01/2011, 11:38 AM

## 2011-07-01 NOTE — Discharge Instructions (Signed)
Repeat a pregnancy test in 2-4 weeks if no period.

## 2011-07-01 NOTE — MAU Note (Signed)
Patient states she has a history of having 4 miscarriages since November. 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 Had a home pregnancy test that was positive. Is currently having no problems.

## 2011-07-02 ENCOUNTER — Encounter (HOSPITAL_COMMUNITY): Payer: Self-pay | Admitting: *Deleted

## 2011-07-03 ENCOUNTER — Encounter (HOSPITAL_COMMUNITY): Payer: Self-pay | Admitting: *Deleted

## 2011-07-03 ENCOUNTER — Inpatient Hospital Stay (HOSPITAL_COMMUNITY): Payer: Medicaid Other

## 2011-07-03 ENCOUNTER — Inpatient Hospital Stay (HOSPITAL_COMMUNITY)
Admission: AD | Admit: 2011-07-03 | Discharge: 2011-07-04 | Disposition: A | Discharge status: Home / Self Care | Payer: Medicaid Other | Source: Ambulatory Visit | Attending: Obstetrics & Gynecology | Admitting: Obstetrics & Gynecology

## 2011-07-03 DIAGNOSIS — O99891 Other specified diseases and conditions complicating pregnancy: Secondary | ICD-10-CM | POA: Insufficient documentation

## 2011-07-03 DIAGNOSIS — M549 Dorsalgia, unspecified: Secondary | ICD-10-CM | POA: Insufficient documentation

## 2011-07-03 DIAGNOSIS — R109 Unspecified abdominal pain: Secondary | ICD-10-CM | POA: Insufficient documentation

## 2011-07-03 DIAGNOSIS — O26899 Other specified pregnancy related conditions, unspecified trimester: Secondary | ICD-10-CM

## 2011-07-03 HISTORY — DX: Cardiac murmur, unspecified: R01.1

## 2011-07-03 LAB — CBC
MCH: 25.7 pg — ABNORMAL LOW (ref 26.0–34.0)
Platelets: 313 10*3/uL (ref 150–400)
RBC: 4.35 MIL/uL (ref 3.87–5.11)
RDW: 18 % — ABNORMAL HIGH (ref 11.5–15.5)
WBC: 11 10*3/uL — ABNORMAL HIGH (ref 4.0–10.5)

## 2011-07-03 LAB — URINALYSIS, ROUTINE W REFLEX MICROSCOPIC
Bilirubin Urine: NEGATIVE
Glucose, UA: NEGATIVE mg/dL
Hgb urine dipstick: NEGATIVE
Ketones, ur: NEGATIVE mg/dL
Leukocytes, UA: NEGATIVE
Nitrite: NEGATIVE
Protein, ur: NEGATIVE mg/dL

## 2011-07-03 LAB — HCG, QUANTITATIVE, PREGNANCY: hCG, Beta Chain, Quant, S: 219 m[IU]/mL — ABNORMAL HIGH (ref <5)

## 2011-07-03 NOTE — Progress Notes (Signed)
T. Burleson NP in to see pt 

## 2011-07-03 NOTE — MAU Provider Note (Signed)
History     CSN: [id number]  Arrival date and time: 07/03/11 2052   First Provider Initiated Contact with Patient 07/03/11 2154      Chief Complaint  Patient presents with  . Back Pain   HPI Cynthia Scott 31 y.o. [redacted]w[redacted]d Comes to MAU again for the second time this week.  Earlier this week her pregnancy test was negative.  Today it is positive.  Having back pain.  Is worried about this pregnancy.  Has had several  Chronic health conditions.  Last baby is one year old.  Had diagnosis of cancer and chemo prior to pregnancy with her child.  Has not been going for follow up regularly.  Last pregnancy was managed by Cynthia Scott in [facility].  OB History    Grav Para Term Preterm Abortions TAB SAB Ect Mult Living   11 5 5  4 4    5       Past Medical History  Diagnosis Date  . Non-Hodgkin lymphoma   . Blood transfusion 01/25/2009  . Hypertension   . 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 Cancer   . Heart murmur     Past Surgical History  Procedure Date  . Rhinoplasty   . Port-a-cath removal   . Chest wall tumor excision   . Tonsillectomy   . Wisdom tooth extraction 2010  . Appendectomy   . Cholecystectomy   . Tubes in ears   . Turmor in chest     Family History  Problem Relation Age of Onset  . Anesthesia problems Neg Hx   . Heart disease Mother   . Heart disease Maternal Aunt   . Cancer Maternal Uncle   . Heart disease Maternal Grandmother     History  Substance Use Topics  . Smoking status: Current Some Day Smoker -- 0.5 packs/day  . Smokeless tobacco: Not on file  . Alcohol Use: No     pt states she stopped in 2006    Allergies: No Known Allergies  No prescriptions prior to admission    Review of Systems  Constitutional: Negative for fever.  Gastrointestinal: Positive for abdominal pain. Negative for nausea, vomiting, diarrhea and constipation.  Genitourinary: Negative for dysuria.       No vaginal bleeding   Physical Exam   Blood pressure 111/63,  pulse 82, temperature 98.7 F (37.1 C), temperature source Oral, resp. rate 16, height 5\' 2"  (1.575 m), weight 275 lb 6.4 oz (124.921 kg), last menstrual period 05/15/2011.  Physical Exam  Nursing note and vitals reviewed. Constitutional: She is oriented to person, place, and time. She appears well-developed and well-nourished.       Morbidly obese  HENT:  Head: Normocephalic.  Eyes: EOM are normal.  Neck: Neck supple.  GI: Soft. There is no tenderness.  Genitourinary:       Speculum exam: Vagina -  Minimal discharge, no odor Cervix - No contact bleeding Bimanual exam: Cervix closed Uterus - exam limited by habitus, nontender Adnexa  - exam limitied by habitus, nontender GC/Chlam, wet prep done Chaperone present for exam.  Musculoskeletal: Normal range of motion.       Pain in midline mid back and between shoulders  Neurological: She is alert and oriented to person, place, and time.  Skin: Skin is warm and dry.  Psychiatric: She has a normal mood and affect.    MAU Course  Procedures Results for orders placed during the hospital encounter of 07/03/11 (from the past 24 hour(s))  URINALYSIS,  ROUTINE W REFLEX MICROSCOPIC     Status: Normal   Collection Time   07/03/11  9:00 PM      Component Value Range   Color, Urine YELLOW  YELLOW    APPearance CLEAR  CLEAR    Specific Gravity, Urine 1.025  1.005 - 1.030    pH 6.0  5.0 - 8.0    Glucose, UA NEGATIVE  NEGATIVE (mg/dL)   Hgb urine dipstick NEGATIVE  NEGATIVE    Bilirubin Urine NEGATIVE  NEGATIVE    Ketones, ur NEGATIVE  NEGATIVE (mg/dL)   Protein, ur NEGATIVE  NEGATIVE (mg/dL)   Urobilinogen, UA 0.2  0.0 - 1.0 (mg/dL)   Nitrite NEGATIVE  NEGATIVE    Leukocytes, UA NEGATIVE  NEGATIVE   POCT PREGNANCY, URINE     Status: Abnormal   Collection Time   07/03/11  9:27 PM      Component Value Range   Preg Test, Ur POSITIVE (*) NEGATIVE   CBC     Status: Abnormal   Collection Time   07/03/11 10:31 PM      Component Value Range    WBC 11.0 (*) 4.0 - 10.5 (K/uL)   RBC 4.35  3.87 - 5.11 (MIL/uL)   Hemoglobin 11.2 (*) 12.0 - 15.0 (g/dL)   HCT 16.1 (*) 09.6 - 46.0 (%)   MCV 78.4  78.0 - 100.0 (fL)   MCH 25.7 (*) 26.0 - 34.0 (pg)   MCHC 32.8  30.0 - 36.0 (g/dL)   RDW 04.5 (*) 40.9 - 15.5 (%)   Platelets 313  150 - 400 (K/uL)  HCG, QUANTITATIVE, PREGNANCY     Status: Abnormal   Collection Time   07/03/11 10:31 PM      Component Value Range   hCG, Beta Chain, Quant, S 219 (*) <5 (mIU/mL)   MDM *RADIOLOGY REPORT*  Clinical Data: Back pain. Quantitative beta HCG is 219 (91 on  05/17/2011). Estimated gestational age by LMP is 7 weeks 0 days.  OBSTETRIC <14 WK [place] AND TRANSVAGINAL OB US  Technique: Both transabdominal and transvaginal ultrasound  examinations were performed for complete evaluation of the  gestation as well as the maternal uterus, adnexal regions, and  pelvic cul-de-sac. Transvaginal technique was performed to assess  early pregnancy.  Comparison: None.  Intrauterine gestational sac: Thickened endometrial stripe with  fluid in the endometrial cavity. No defined intrauterine  gestational sac is visualized.  Yolk sac: Not visualized  Embryo: Not visualized  Cardiac Activity: Not demonstrated  Heart Rate: N/A bpm  Maternal uterus/adnexae:  The uterus is anteverted. Thickened endometrium, measuring up to  about 1.9 cm. There is an irregular fluid collection within the  endometrium which may represent an abnormal gestational sac or free  fluid. No fetal pole or yolk sac is visualized. Cervix and lower  uterine segment are unremarkable.  The left ovary measures 2.1 x 1.0 x 1.3 cm and contains normal  follicular changes. The right ovary measures 3.3 x 2.1 x 2.4 cm  and contains normal follicular changes. No abnormal adnexal masses  are visualized. No free pelvic fluid collections.  IMPRESSION:  Endometrial thickening and fluid without defined intrauterine  gestational sac. No fetal pole or yolk sac  visualized. Changes  could represent a spontaneous abortion versus abnormal gestation.  Early intrauterine pregnancy, too small to see, or ectopic  pregnancy are not entirely excluded. Recommend follow-up with  serial quantitative beta HCG and / or short-term ultrasound in 1-2  weeks as clinically indicated.  Assessment and Plan  Abdominal pain in early pregnancy  Plan Return on Monday morning between 8 am and 12 noon for repeat labs NO sex, no tampons, no douching, no heavy lifting. Return sooner for severe lower abdominal pain or vaginal bleeding. Contact your doctor in [place] who manages your cancer follow up and let them know that you are pregnant.  Ken Bonn 07/03/2011, 10:58 PM

## 2011-07-03 NOTE — MAU Note (Signed)
Pt states, " I was here 5/22 and had a negative pregnancy test. Since then I've repeated it at home and had six HPT. I've had low back pain since this am.

## 2011-07-04 LAB — WET PREP, GENITAL: Yeast Wet Prep HPF POC: NONE SEEN

## 2011-07-04 LAB — GC/CHLAMYDIA PROBE AMP, GENITAL: Chlamydia, DNA Probe: NEGATIVE

## 2011-07-04 NOTE — Discharge Instructions (Signed)
The pregnancy hormone is low and the pregnancy is very early.   No pregnancy is seen on ultrasound. You need to return on Monday morning between 8 am and 12 noon for repeat labs. Contact your doctor who manages your cancer to let them know you are pregnant. No sex, no tampons, no douching, nothing in the vagina. No heavy lifting Return sooner if you develop severe abdominal pain or vaginal bleeding.

## 2011-07-04 NOTE — Progress Notes (Signed)
Written and verbal d/c instructions given and understanding voiced. 

## 2011-07-06 ENCOUNTER — Encounter (HOSPITAL_COMMUNITY): Payer: Self-pay | Admitting: *Deleted

## 2011-07-06 ENCOUNTER — Inpatient Hospital Stay (HOSPITAL_COMMUNITY)
Admission: AD | Admit: 2011-07-06 | Discharge: 2011-07-06 | Disposition: A | Discharge status: Home / Self Care | Payer: Medicaid Other | Source: Ambulatory Visit | Attending: Obstetrics & Gynecology | Admitting: Obstetrics & Gynecology

## 2011-07-06 DIAGNOSIS — Z09 Encounter for follow-up examination after completed treatment for conditions other than malignant neoplasm: Secondary | ICD-10-CM

## 2011-07-06 DIAGNOSIS — O99891 Other specified diseases and conditions complicating pregnancy: Secondary | ICD-10-CM | POA: Insufficient documentation

## 2011-07-06 HISTORY — DX: Urinary tract infection, site not specified: N39.0

## 2011-07-06 LAB — HCG, QUANTITATIVE, PREGNANCY: hCG, Beta Chain, Quant, S: 595 m[IU]/mL — ABNORMAL HIGH (ref <5)

## 2011-07-06 NOTE — Discharge Instructions (Signed)
Abdominal Pain During Pregnancy °Belly (abdominal) pain is common during pregnancy. Most of the time, it is not a serious problem. Other times, it can be a sign that something is wrong with the pregnancy. Always tell your doctor if you have belly pain. °HOME CARE °For mild pain: °· Do not have sex (intercourse) or put anything in your vagina until you feel better.  °· Rest until your pain stops. If your pain lasts longer than 1 hour, call your doctor.  °· Drink clear fluids if you feel sick to your stomach (nauseous).  °· Do not eat solid food until you feel better.  °· Only take medicine as told by your doctor.  °· Keep all doctor visits as told.  °GET HELP RIGHT AWAY IF:  °· You are bleeding, leaking fluid, or pieces of tissue come out of your vagina.  °· You have more pain or cramping.  °· You keep throwing up (vomiting).  °· You have pain when you pee (urinate) or have blood in your pee.  °· You have a fever.  °· You do not feel your baby moving as much.  °· You feel very weak or feel like passing out.  °· You have trouble breathing, with or without belly pain.  °· You have a very bad headache and belly pain.  °· You have fluid leaking from your vagina and belly pain.  °· You keep having watery poop (diarrhea).  °· Your belly pain does not go away after resting, or the pain gets worse.  °MAKE SURE YOU:  °· Understand these instructions.  °· Will watch your condition.  °· Will get help right away if you are not doing well or get worse.  °Document Released: 01/14/2009 Document Revised: 01/15/2011 Document Reviewed: 08/22/2010 °[employer]® Patient Information ©2012 [employer]. °

## 2011-07-06 NOTE — MAU Note (Signed)
Has had some cramping, no pain today. No bleeding.  Here for blood work

## 2011-07-06 NOTE — MAU Provider Note (Signed)
  History     CSN: [id number]  Arrival date and time: 07/06/11 0955   None     No chief complaint on file.  HPI Cynthia Scott is 31 y.o. [id number] [redacted]w[redacted]d weeks presenting for repeat BHCG>  Seen 5/22 with Neg UPT.  She returned on 5/26 with back pain with + UPT.  On that date BHCG 219 and U/S did not see YS or FP.  She usually seen Dr. Shawnie Pons in [facility].  She denies pelvic pain and vaginal bleeding today.   Past Medical History  Diagnosis Date  . Non-Hodgkin lymphoma   . Blood transfusion 01/25/2009  . Hypertension   . 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 Cancer   . Heart murmur     Past Surgical History  Procedure Date  . Rhinoplasty   . Port-a-cath removal   . Chest wall tumor excision   . Tonsillectomy   . Wisdom tooth extraction 2010  . Appendectomy   . Cholecystectomy   . Tubes in ears   . Turmor in chest     Family History  Problem Relation Age of Onset  . Anesthesia problems Neg Hx   . Heart disease Mother   . Heart disease Maternal Aunt   . Cancer Maternal Uncle   . Heart disease Maternal Grandmother     History  Substance Use Topics  . Smoking status: Current Some Day Smoker -- 0.5 packs/day  . Smokeless tobacco: Not on file  . Alcohol Use: No     pt states she stopped in 2006    Allergies: No Known Allergies  No prescriptions prior to admission    Review of Systems  Constitutional: Negative.   Respiratory: Negative.   Cardiovascular: Negative.   Gastrointestinal: Negative for abdominal pain.  Genitourinary:       Neg for vaginal bleeding   Physical Exam   Last menstrual period 05/15/2011.  Physical Exam  Constitutional: She is oriented to person, place, and time. She appears well-developed and well-nourished. No distress.  HENT:  Head: Normocephalic.  Neck: Normal range of motion.  Cardiovascular: Normal rate.   Respiratory: Effort normal.  Genitourinary:       Not indicated  Neurological: She is alert and oriented to person,  place, and time.  Skin: Skin is warm and dry.  Psychiatric: She has a normal mood and affect. Her behavior is normal. Thought content normal.   Results for orders placed during the hospital encounter of 07/06/11 (from the past 24 hour(s))  HCG, QUANTITATIVE, PREGNANCY     Status: Abnormal   Collection Time   07/06/11 10:10 AM      Component Value Range   hCG, Beta Chain, Quant, S 595 (*) <5 (mIU/mL)    MAU Course  Procedures  MDM Reviewed lab results to the patient.  Patient states she has moved to [facility] and would like prenatal care here.  List given to patient.    Assessment and Plan  A:  Appropriately rising BHCG in early pregnancy  P: Plan Ultrasound here in 1 week     Return before that date for pain or vaginal bleeding    Begin prenatal vitamins  Jamika Sadek,EVE M 07/06/2011, 10:00 AM

## 2011-07-13 ENCOUNTER — Ambulatory Visit (HOSPITAL_COMMUNITY)
Admission: RE | Admit: 2011-07-13 | Discharge: 2011-07-13 | Disposition: A | Discharge status: Home / Self Care | Payer: Medicaid Other | Source: Ambulatory Visit | Attending: Gynecology | Admitting: Gynecology

## 2011-07-13 ENCOUNTER — Inpatient Hospital Stay (HOSPITAL_COMMUNITY)
Admission: AD | Admit: 2011-07-13 | Discharge: 2011-07-13 | Disposition: A | Discharge status: Home / Self Care | Payer: Medicaid Other | Source: Ambulatory Visit | Attending: Obstetrics & Gynecology | Admitting: Obstetrics & Gynecology

## 2011-07-13 DIAGNOSIS — O200 Threatened abortion: Secondary | ICD-10-CM | POA: Insufficient documentation

## 2011-07-13 DIAGNOSIS — O2620 Pregnancy care for patient with recurrent pregnancy loss, unspecified trimester: Secondary | ICD-10-CM | POA: Insufficient documentation

## 2011-07-13 DIAGNOSIS — E669 Obesity, unspecified: Secondary | ICD-10-CM | POA: Insufficient documentation

## 2011-07-13 NOTE — MAU Provider Note (Signed)
Attestation of Attending Supervision of Advanced Practitioner: Evaluation and management procedures were performed by the OB Fellow/PA/CNM/NP under my supervision and collaboration. Chart reviewed, and agree with management and plan.  Dorsie Burich, M.D. 07/13/2011 8:11 PM   

## 2011-07-13 NOTE — MAU Note (Signed)
Patient to MAU after ultrasound. Patient denies any pain or bleeding.  

## 2011-07-13 NOTE — MAU Provider Note (Signed)
30 [id number] @[redacted]w[redacted]d  presents to MAU for f/u ultrasound.  She denies abdominal pain, LOF, vaginal bleeding, vaginal itching/burning, urinary symptoms, h/a, dizziness, n/v, or fever/chills.    BP 106/60  Pulse 91  Temp(Src) 98.8 F (37.1 C) (Oral)  Resp 18  SpO2 100%  LMP 05/15/2011   Previous U/S indicated gestational sac but no yolk sac.  U/S repeated today:  A: Early IUP 5w 4d by U/S  P: D/C home List of prenatal providers given Begin prenatal care as soon as possible Return to MAU as needed

## 2011-07-13 NOTE — Discharge Instructions (Signed)
Pregnancy - First Trimester  During sexual intercourse, millions of sperm go into the vagina. Only 1 sperm will penetrate and fertilize the female egg while it is in the Fallopian tube. One week later, the fertilized egg implants into the wall of the uterus. An embryo begins to develop into a baby. At 6 to 8 weeks, the eyes and face are formed and the heartbeat can be seen on ultrasound. At the end of 12 weeks (first trimester), all the baby's organs are formed. Now that you are pregnant, you will want to do everything you can to have a healthy baby. Two of the most important things are to get good prenatal care and follow your caregiver's instructions. Prenatal care is all the medical care you receive before the baby's birth. It is given to prevent, find, and treat problems during the pregnancy and childbirth.  PRENATAL EXAMS   During prenatal visits, your weight, blood pressure and urine are checked. This is done to make sure you are healthy and progressing normally during the pregnancy.   A pregnant woman should gain 25 to 35 pounds during the pregnancy. However, if you are over weight or underweight, your caregiver will advise you regarding your weight.   Your caregiver will ask and answer questions for you.   Blood work, cervical cultures, other necessary tests and a Pap test are done during your prenatal exams. These tests are done to check on your health and the probable health of your baby. Tests are strongly recommended and done for HIV with your permission. This is the virus that causes AIDS. These tests are done because medications can be given to help prevent your baby from being born with this infection should you have been infected without knowing it. Blood work is also used to find out your blood type, previous infections and follow your blood levels (hemoglobin).   Low hemoglobin (anemia) is common during pregnancy. Iron and vitamins are given to help prevent this. Later in the pregnancy, blood  tests for diabetes will be done along with any other tests if any problems develop. You may need tests to make sure you and the baby are doing well.   You may need other tests to make sure you and the baby are doing well.  CHANGES DURING THE FIRST TRIMESTER (THE FIRST 3 MONTHS OF PREGNANCY)  Your body goes through many changes during pregnancy. They vary from person to person. Talk to your caregiver about changes you notice and are concerned about. Changes can include:   Your menstrual period stops.   The egg and sperm carry the genes that determine what you look like. Genes from you and your partner are forming a baby. The female genes determine whether the baby is a boy or a girl.   Your body increases in girth and you may feel bloated.   Feeling sick to your stomach (nauseous) and throwing up (vomiting). If the vomiting is uncontrollable, call your caregiver.   Your breasts will begin to enlarge and become tender.   Your nipples may stick out more and become darker.   The need to urinate more. Painful urination may mean you have a bladder infection.   Tiring easily.   Loss of appetite.   Cravings for certain kinds of food.   At first, you may gain or lose a couple of pounds.   You may have changes in your emotions from day to day (excited to be pregnant or concerned something may go wrong with   the pregnancy and baby).   You may have more vivid and strange dreams.  HOME CARE INSTRUCTIONS    It is very important to avoid all smoking, alcohol and un-prescribed drugs during your pregnancy. These affect the formation and growth of the baby. Avoid chemicals while pregnant to ensure the delivery of a healthy infant.   Start your prenatal visits by the 12th week of pregnancy. They are usually scheduled monthly at first, then more often in the last 2 months before delivery. Keep your caregiver's appointments. Follow your caregiver's instructions regarding medication use, blood and lab tests, exercise, and  diet.   During pregnancy, you are providing food for you and your baby. Eat regular, well-balanced meals. Choose foods such as meat, fish, milk and other low fat dairy products, vegetables, fruits, and whole-grain breads and cereals. Your caregiver will tell you of the ideal weight gain.   You can help morning sickness by keeping soda crackers at the bedside. Eat a couple before arising in the morning. You may want to use the crackers without salt on them.   Eating 4 to 5 small meals rather than 3 large meals a day also may help the nausea and vomiting.   Drinking liquids between meals instead of during meals also seems to help nausea and vomiting.   A physical sexual relationship may be continued throughout pregnancy if there are no other problems. Problems may be early (premature) leaking of amniotic fluid from the membranes, vaginal bleeding, or belly (abdominal) pain.   Exercise regularly if there are no restrictions. Check with your caregiver or physical therapist if you are unsure of the safety of some of your exercises. Greater weight gain will occur in the last 2 trimesters of pregnancy. Exercising will help:   Control your weight.   Keep you in shape.   Prepare you for labor and delivery.   Help you lose your pregnancy weight after you deliver your baby.   Wear a good support or jogging bra for breast tenderness during pregnancy. This may help if worn during sleep too.   Ask when prenatal classes are available. Begin classes when they are offered.   Do not use hot tubs, steam rooms or saunas.   Wear your seat belt when driving. This protects you and your baby if you are in an accident.   Avoid raw meat, uncooked cheese, cat litter boxes and soil used by cats throughout the pregnancy. These carry germs that can cause birth defects in the baby.   The first trimester is a good time to visit your dentist for your dental health. Getting your teeth cleaned is OK. Use a softer toothbrush and brush  gently during pregnancy.   Ask for help if you have financial, counseling or nutritional needs during pregnancy. Your caregiver will be able to offer counseling for these needs as well as refer you for other special needs.   Do not take any medications or herbs unless told by your caregiver.   Inform your caregiver if there is any mental or physical domestic violence.   Make a list of emergency phone numbers of family, friends, hospital, and police and fire departments.   Write down your questions. Take them to your prenatal visit.   Do not douche.   Do not cross your legs.   If you have to stand for long periods of time, rotate you feet or take small steps in a circle.   You may have more vaginal secretions that may   require a sanitary pad. Do not use tampons or scented sanitary pads.  MEDICATIONS AND DRUG USE IN PREGNANCY   Take prenatal vitamins as directed. The vitamin should contain 1 milligram of folic acid. Keep all vitamins out of reach of children. Only a couple vitamins or tablets containing iron may be fatal to a baby or young child when ingested.   Avoid use of all medications, including herbs, over-the-counter medications, not prescribed or suggested by your caregiver. Only take over-the-counter or prescription medicines for pain, discomfort, or fever as directed by your caregiver. Do not use aspirin, ibuprofen, or naproxen unless directed by your caregiver.   Let your caregiver also know about herbs you may be using.   Alcohol is related to a number of birth defects. This includes fetal alcohol syndrome. All alcohol, in any form, should be avoided completely. Smoking will cause low birth rate and premature babies.   Street or illegal drugs are very harmful to the baby. They are absolutely forbidden. A baby born to an addicted mother will be addicted at birth. The baby will go through the same withdrawal an adult does.   Let your caregiver know about any medications that you have to take  and for what reason you take them.  MISCARRIAGE IS COMMON DURING PREGNANCY  A miscarriage does not mean you did something wrong. It is not a reason to worry about getting pregnant again. Your caregiver will help you with questions you may have. If you have a miscarriage, you may need minor surgery.  SEEK MEDICAL CARE IF:   You have any concerns or worries during your pregnancy. It is better to call with your questions if you feel they cannot wait, rather than worry about them.  SEEK IMMEDIATE MEDICAL CARE IF:    An unexplained oral temperature above 102 F (38.9 C) develops, or as your caregiver suggests.   You have leaking of fluid from the vagina (birth canal). If leaking membranes are suspected, take your temperature and inform your caregiver of this when you call.   There is vaginal spotting or bleeding. Notify your caregiver of the amount and how many pads are used.   You develop a bad smelling vaginal discharge with a change in the color.   You continue to feel sick to your stomach (nauseated) and have no relief from remedies suggested. You vomit blood or coffee ground-like materials.   You lose more than 2 pounds of weight in 1 week.   You gain more than 2 pounds of weight in 1 week and you notice swelling of your face, hands, feet, or legs.   You gain 5 pounds or more in 1 week (even if you do not have swelling of your hands, face, legs, or feet).   You get exposed to German measles and have never had them.   You are exposed to fifth disease or chickenpox.   You develop belly (abdominal) pain. Round ligament discomfort is a common non-cancerous (benign) cause of abdominal pain in pregnancy. Your caregiver still must evaluate this.   You develop headache, fever, diarrhea, pain with urination, or shortness of breath.   You fall or are in a car accident or have any kind of trauma.   There is mental or physical violence in your home.  Document Released: 01/20/2001 Document Revised: 01/15/2011  Document Reviewed: 07/24/2008  [employer] Patient Information 2012 [employer].

## 2011-07-13 NOTE — [facility] Provider Note (Signed)
  History     CSN: [id number]  Arrival date and time: 07/13/11 1624   None     Chief Complaint  Patient presents with  . Follow-up   HPI This is a 31 y.o. female at [redacted]w[redacted]d here for [place] followup   OB History    Grav Para Term Preterm Abortions TAB SAB Ect Mult Living   9 5 5  3  0 3   5      Past Medical History  Diagnosis Date  . Non-Hodgkin lymphoma   . Blood transfusion 01/25/2009  . Hypertension   . 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 Cancer   . Heart murmur   . Urinary tract infection     Past Surgical History  Procedure Date  . Rhinoplasty   . Port-a-cath removal   . Chest wall tumor excision   . Tonsillectomy   . Wisdom tooth extraction 2010  . Appendectomy   . Cholecystectomy   . Tubes in ears   . Turmor in chest     Family History  Problem Relation Age of Onset  . Anesthesia problems Neg Hx   . Heart disease Mother   . Hearing loss Mother   . Heart disease Maternal Aunt   . Cancer Maternal Uncle   . Heart disease Maternal Grandmother     History  Substance Use Topics  . Smoking status: Current Some Day Smoker -- 0.5 packs/day for 13 years    Types: Cigarettes  . Smokeless tobacco: Not on file  . Alcohol Use: No    Allergies: No Known Allergies  No prescriptions prior to admission    ROS Negative  Physical Exam   Blood pressure 106/60, pulse 91, temperature 98.8 F (37.1 C), temperature source Oral, resp. rate 18, last menstrual period 05/15/2011, SpO2 100.00%.  Physical Exam US done today:   MAU Course  Procedures  MDM Report to L. Leftwich-Kirby CNM  Assessment and Plan  Radiologist recommends f/u US in 10-14 days  [facility] 07/13/2011, 5:07 PM

## 2011-07-15 NOTE — MAU Provider Note (Signed)
Attestation of Attending Supervision of Advanced Practitioner: Evaluation and management procedures were performed by the OB Fellow/PA/CNM/NP under my supervision and collaboration. Chart reviewed, and agree with management and plan.  Claudina Oliphant, M.D. 07/15/2011 12:36 PM   

## 2011-07-20 ENCOUNTER — Emergency Department (HOSPITAL_COMMUNITY)
Admission: EM | Admit: 2011-07-20 | Discharge: 2011-07-20 | Disposition: A | Discharge status: Home / Self Care | Payer: Medicaid Other | Attending: Emergency Medicine | Admitting: Emergency Medicine

## 2011-07-20 ENCOUNTER — Encounter (HOSPITAL_COMMUNITY): Payer: Self-pay | Admitting: Emergency Medicine

## 2011-07-20 DIAGNOSIS — O99891 Other specified diseases and conditions complicating pregnancy: Secondary | ICD-10-CM | POA: Insufficient documentation

## 2011-07-20 DIAGNOSIS — O99330 Smoking (tobacco) complicating pregnancy, unspecified trimester: Secondary | ICD-10-CM | POA: Insufficient documentation

## 2011-07-20 DIAGNOSIS — O169 Unspecified maternal hypertension, unspecified trimester: Secondary | ICD-10-CM | POA: Insufficient documentation

## 2011-07-20 DIAGNOSIS — Z20820 Contact with and (suspected) exposure to varicella: Secondary | ICD-10-CM

## 2011-07-20 DIAGNOSIS — Z87898 Personal history of other specified conditions: Secondary | ICD-10-CM | POA: Insufficient documentation

## 2011-07-20 LAB — VARICELLA ZOSTER ANTIBODY, IGM: Varicella-Zoster Ab, IgM: 0.05 (ref <0.91)

## 2011-07-20 MED ORDER — VARICELLA-ZOSTER IMMUNE GLOB 125 UNITS IJ SOLR: 625.0000 [IU] | Freq: Once | INTRAMUSCULAR | Status: DC

## 2011-07-20 NOTE — ED Notes (Signed)
Pt came to ED with spouse tonight who is being tx for chicken pox. Pt is currently in 1st trimester in pregnancy and fetus is at risk due to exposure to chicken pox.

## 2011-07-20 NOTE — Discharge Instructions (Signed)
You should be called later today to come in for your chickenpox antibody shot. If you have not been called by 5:00, call the emergency department. Please locate a OB doctor to follow your pregnancy.

## 2011-07-20 NOTE — ED Notes (Signed)
Pt sts that her husband starting showing sx of chicken pox yesterday after eating chinese food. Patients husbands rash starting spreading from chest to arms and stomach within 1 day. Patient has not noticed any rash on her skin. Patient sts she has slightly lower blood pressure than normal tonight.

## 2011-07-20 NOTE — ED Provider Notes (Signed)
History     CSN: [id number]  Arrival date & time 07/20/11  0105   First MD Initiated Contact with Patient 07/20/11 0111      Chief Complaint  Patient presents with  . Varicella    (Consider location/radiation/quality/duration/timing/severity/associated sxs/prior treatment) HPI 31 yo female presents to the ER after her husband was diagnosed with chickenpox.  Pt is 2 months pregnant and denies having history of having chickenpox herself.  Pt's child had varicella vaccination 6 days ago.  All other children have had vaccinations.  She denies rash on herself.  Pt's husband with onset of rash yesterday.  Of note, pt is already high risk OB as she has history of nonhodgkin lymphoma.  She has been through chemotherapy, but became pregnant prior to radiation tx.  Pt was to start radiation tx but discovered she was pregnant again.  She does not currently have an OB.  She has been seen at [facility]U due to first trimester bleeding.   Past Medical History  Diagnosis Date  . Non-Hodgkin lymphoma   . Blood transfusion 01/25/2009  . Hypertension   . 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 Cancer   . Heart murmur   . Urinary tract infection     Past Surgical History  Procedure Date  . Rhinoplasty   . Port-a-cath removal   . Chest wall tumor excision   . Tonsillectomy   . Wisdom tooth extraction 2010  . Appendectomy   . Cholecystectomy   . Tubes in ears   . Turmor in chest     Family History  Problem Relation Age of Onset  . Anesthesia problems Neg Hx   . Heart disease Mother   . Hearing loss Mother   . Heart disease Maternal Aunt   . Cancer Maternal Uncle   . Heart disease Maternal Grandmother     History  Substance Use Topics  . Smoking status: Current Some Day Smoker -- 0.5 packs/day for 13 years    Types: Cigarettes  . Smokeless tobacco: Not on file  . Alcohol Use: No    OB History    Grav Para Term Preterm Abortions TAB SAB Ect Mult Living   9 5 5  3  0 3   5      Review of  Systems  All other systems reviewed and are negative.    Allergies  Review of patient's allergies indicates no known allergies.  Home Medications   Current Outpatient Rx  Name Route Sig Dispense Refill  . VARICELLA-ZOSTER IMMUNE GLOB 125 UNITS IJ SOLR Injection Inject 625 Units as directed once. 5 each 0    BP 95/48  Pulse 83  Temp(Src) 98 F (36.7 C) (Oral)  Resp 18  SpO2 100%  LMP 05/15/2011  Physical Exam  Nursing note and vitals reviewed. Constitutional: She is oriented to person, place, and time. She appears well-developed and well-nourished.  HENT:  Head: Normocephalic and atraumatic.  Nose: Nose normal.  Mouth/Throat: Oropharynx is clear and moist.  Eyes: Conjunctivae and EOM are normal. Pupils are equal, round, and reactive to light.  Neck: Normal range of motion. Neck supple. No JVD present. No tracheal deviation present. No thyromegaly present.  Cardiovascular: Normal rate, regular rhythm, normal heart sounds and intact distal pulses.  Exam reveals no gallop and no friction rub.   No murmur heard. Pulmonary/Chest: Effort normal and breath sounds normal. No stridor. No respiratory distress. She has no wheezes. She has no rales. She exhibits no tenderness.  Abdominal: Soft. Bowel sounds  are normal. She exhibits no distension and no mass. There is no tenderness. There is no rebound and no guarding.  Musculoskeletal: Normal range of motion. She exhibits no edema and no tenderness.  Lymphadenopathy:    She has no cervical adenopathy.  Neurological: She is oriented to person, place, and time. She exhibits normal muscle tone. Coordination normal.  Skin: Skin is dry. No rash noted. No erythema. No pallor.  Psychiatric: She has a normal mood and affect. Her behavior is normal. Judgment and thought content normal.    ED Course  Procedures (including critical care time)   Labs Reviewed  VARICELLA ZOSTER ANTIBODY, IGG  VARICELLA ZOSTER ANTIBODY, IGM   No results  found.   1. Varicella exposure complicating pregnancy       MDM  31 yo female in first trimester with exposure to varicella without known prior disease.  Discussed with on call OB Dr Despina Hidden who recommends varicella IG (VZIG).  This is not currently available in the [facility].  Thurston Hole, pharmacist at [facility] is tracking down this medication.  Deborah Chalk, pharmacist at [facility] will have the morning staff call the patient when it is available for injection.  Pt requires this injection within 4 days of exposure, and Monday is day 3 (Husband outbreak on Saturday).  It is expected that the injection will be found and available later today.  Pt's titers have been drawn, and normally take 24 hours to result.  Due to high concern for varicella, it is not safe for the patient to go to women's hospital and potentially expose other pregnant women and babies.  Pt is to be called by [facility] pharmacy to come to [facility] ED for her injection.  Potentially, if her IG titers show that she is immune to Varicella, she may not need injection at all.  All of this has been related to the patient.  Each pharmacist will be relaying this information onto the morning staff.  I will alert the am charge nurse and EDPs.        Olivia Mackie, MD 07/20/11 [phone]

## 2011-07-20 NOTE — ED Notes (Signed)
Patient given discharge instructions, information, prescriptions, and diet order. Patient states that they adequately understand discharge information given and to return to ED if symptoms return or worsen.    Patient has been given instruction to come back tomorrow to [facility] for follow up shot of immunoglobulin. She has been instructed that if [facility] or pharmacy does not call, she is to call them to make sure her shot will be ready. Patient has been given [facility] main phone number and Charge RN phone number. Patient has been informed of the risks of not getting the shot of immunoglobulin and what can happen to her child. Patient sts she understands fully.

## 2011-07-21 ENCOUNTER — Emergency Department (HOSPITAL_COMMUNITY)
Admission: EM | Admit: 2011-07-21 | Discharge: 2011-07-21 | Disposition: A | Discharge status: Home / Self Care | Payer: Medicaid Other | Attending: Emergency Medicine | Admitting: Emergency Medicine

## 2011-07-21 DIAGNOSIS — O99330 Smoking (tobacco) complicating pregnancy, unspecified trimester: Secondary | ICD-10-CM | POA: Insufficient documentation

## 2011-07-21 DIAGNOSIS — Z23 Encounter for immunization: Secondary | ICD-10-CM | POA: Insufficient documentation

## 2011-07-21 DIAGNOSIS — O99891 Other specified diseases and conditions complicating pregnancy: Secondary | ICD-10-CM

## 2011-07-21 DIAGNOSIS — Z20820 Contact with and (suspected) exposure to varicella: Secondary | ICD-10-CM | POA: Insufficient documentation

## 2011-07-21 DIAGNOSIS — O9989 Other specified diseases and conditions complicating pregnancy, childbirth and the puerperium: Secondary | ICD-10-CM | POA: Insufficient documentation

## 2011-07-21 DIAGNOSIS — Z87898 Personal history of other specified conditions: Secondary | ICD-10-CM | POA: Insufficient documentation

## 2011-07-21 NOTE — ED Provider Notes (Signed)
History    31 year old female presenting for varicella zoster immunoglobulin. Patient is currently 2 months pregnant. Her husband currently has her sella zoster. Patient with no prior recollection of ever being infected or getting the vaccine. She was seen yesterday and instructed to followup today to get immunoglobulin. Varicella titers were drawn during that visit. Patient with no acute complaints. No rash. No fever. No tingling or itching.  CSN: [id number]  Arrival date & time 07/21/11  1517   First MD Initiated Contact with Patient 07/21/11 1531      No chief complaint on file.   (Consider location/radiation/quality/duration/timing/severity/associated sxs/prior treatment) HPI  Past Medical History  Diagnosis Date  . Non-Hodgkin lymphoma   . Blood transfusion 01/25/2009  . Hypertension   . 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 Cancer   . Heart murmur   . Urinary tract infection     Past Surgical History  Procedure Date  . Rhinoplasty   . Port-a-cath removal   . Chest wall tumor excision   . Tonsillectomy   . Wisdom tooth extraction 2010  . Appendectomy   . Cholecystectomy   . Tubes in ears   . Turmor in chest     Family History  Problem Relation Age of Onset  . Anesthesia problems Neg Hx   . Heart disease Mother   . Hearing loss Mother   . Heart disease Maternal Aunt   . Cancer Maternal Uncle   . Heart disease Maternal Grandmother     History  Substance Use Topics  . Smoking status: Current Some Day Smoker -- 0.5 packs/day for 13 years    Types: Cigarettes  . Smokeless tobacco: Not on file  . Alcohol Use: No    OB History    Grav Para Term Preterm Abortions TAB SAB Ect Mult Living   9 5 5  3  0 3   5      Review of Systems   Review of symptoms negative unless otherwise noted in HPI.   Allergies  Review of patient's allergies indicates no known allergies.  Home Medications   Current Outpatient Rx  Name Route Sig Dispense Refill  .  VARICELLA-ZOSTER IMMUNE GLOB 125 UNITS IJ SOLR Injection Inject 625 Units as directed once. 5 each 0    LMP 05/15/2011  Physical Exam  Nursing note and vitals reviewed. Constitutional: She appears well-developed and well-nourished. No distress.  HENT:  Head: Normocephalic and atraumatic.  Eyes: Conjunctivae are normal. Right eye exhibits no discharge. Left eye exhibits no discharge.  Neck: Neck supple.  Cardiovascular: Normal rate, regular rhythm and normal heart sounds.  Exam reveals no gallop and no friction rub.   No murmur heard. Pulmonary/Chest: Effort normal and breath sounds normal. No respiratory distress.  Abdominal: Soft.  Musculoskeletal: She exhibits no edema and no tenderness.  Neurological: She is alert.  Skin: Skin is warm and dry.       No concerning skin lesions noted  Psychiatric: She has a normal mood and affect. Her behavior is normal. Thought content normal.    ED Course  Procedures (including critical care time)  Labs Reviewed - No data to display No results found.   1. Maternal varicella exposure       MDM  Patient presenting for varicella immunoglobulin. varicella-zoster titers drawn yesterday. These results were reviewed. Patient has antibodies to VZV as indicated by an elevated IgG level. IgM level is low consistent with no current active infection.        Raeford Razor,  MD 07/21/11 1546

## 2011-07-21 NOTE — Discharge Instructions (Signed)
The varicella-zoster titers drawn on your last visit are consistent with prior exposure and no active current infection. There is no need for the immunoglobulin.

## 2011-08-03 ENCOUNTER — Inpatient Hospital Stay (HOSPITAL_COMMUNITY)
Admission: AD | Admit: 2011-08-03 | Discharge: 2011-08-03 | Disposition: A | Discharge status: Home / Self Care | Payer: Medicaid Other | Source: Ambulatory Visit | Attending: Obstetrics and Gynecology | Admitting: Obstetrics and Gynecology

## 2011-08-03 ENCOUNTER — Encounter (HOSPITAL_COMMUNITY): Payer: Self-pay | Admitting: *Deleted

## 2011-08-03 DIAGNOSIS — M545 Low back pain, unspecified: Secondary | ICD-10-CM | POA: Insufficient documentation

## 2011-08-03 DIAGNOSIS — M549 Dorsalgia, unspecified: Secondary | ICD-10-CM

## 2011-08-03 DIAGNOSIS — O99891 Other specified diseases and conditions complicating pregnancy: Secondary | ICD-10-CM | POA: Insufficient documentation

## 2011-08-03 DIAGNOSIS — R109 Unspecified abdominal pain: Secondary | ICD-10-CM | POA: Insufficient documentation

## 2011-08-03 LAB — URINE MICROSCOPIC-ADD ON

## 2011-08-03 LAB — URINALYSIS, ROUTINE W REFLEX MICROSCOPIC
Glucose, UA: NEGATIVE mg/dL
Ketones, ur: NEGATIVE mg/dL
Nitrite: NEGATIVE
Protein, ur: NEGATIVE mg/dL

## 2011-08-03 MED ORDER — ACETAMINOPHEN 500 MG PO TABS
1000.0000 mg | ORAL_TABLET | Freq: Once | ORAL | Status: AC
  Administered 2011-08-03: 1000 mg via ORAL
  Filled 2011-08-03: qty 2

## 2011-08-03 NOTE — Discharge Instructions (Signed)
Back Pain in Pregnancy  Back pain during pregnancy is common. It happens in about half of all pregnancies. It is important for you and your baby that you remain active during your pregnancy.If you feel that back pain is not allowing you to remain active or sleep well, it is time to see your caregiver. Back pain may be caused by several factors related to changes during your pregnancy.Fortunately, unless you had trouble with your back before your pregnancy, the pain is likely to get better after you deliver.  Low back pain usually occurs between the fifth and seventh months of pregnancy. It can, however, happen in the first couple months. Factors that increase the risk of back problems include:    Previous back problems.   Injury to your back.   Having twins or multiple births.   A chronic cough.   Stress.   Job-related repetitive motions.   Muscle or spinal disease in the back.   Family history of back problems, ruptured (herniated) discs, or osteoporosis.   Depression, anxiety, and panic attacks.  CAUSES    When you are pregnant, your body produces a hormone called relaxin. This hormonemakes the ligaments connecting the low back and pubic bones more flexible. This flexibility allows the baby to be delivered more easily. When your ligaments are loose, your muscles need to work harder to support your back. Soreness in your back can come from tired muscles. Soreness can also come from back tissues that are irritated since they are receiving less support.   As the baby grows, it puts pressure on the nerves and blood vessels in your pelvis. This can cause back pain.   As the baby grows and gets heavier during pregnancy, the uterus pushes the stomach muscles forward and changes your center of gravity. This makes your back muscles work harder to maintain good posture.  SYMPTOMS   Lumbar pain during pregnancy  Lumbar pain during pregnancy usually occurs at or above the waist in the center of the back. There  may be pain and numbness that radiates into your leg or foot. This is similar to low back pain experienced by non-pregnant women. It usually increases with sitting for long periods of time, standing, or repetitive lifting. Tenderness may also be present in the muscles along your upper back.  Posterior pelvic pain during pregnancy  Pain in the back of the pelvis is more common than lumbar pain in pregnancy. It is a deep pain felt in your side at the waistline, or across the tailbone (sacrum), or in both places. You may have pain on one or both sides. This pain can also go into the buttocks and backs of the upper thighs. Pubic and groin pain may also be present. The pain does not quickly resolve with rest, and morning stiffness may also be present.  Pelvic pain during pregnancy can be brought on by most activities. A high level of fitness before and during pregnancy may or may not prevent this problem. Labor pain is usually 1 to 2 minutes apart, lasts for about 1 minute, and involves a bearing down feeling or pressure in your pelvis. However, if you are at term with the pregnancy, constant low back pain can be the beginning of early labor, and you should be aware of this.  DIAGNOSIS   X-rays of the back should not be done during the first 12 to 14 weeks of the pregnancy and only when absolutely necessary during the rest of the pregnancy. MRIs do   not give off radiation and are safe during pregnancy. MRIs also should only be done when absolutely necessary.  HOME CARE INSTRUCTIONS   Exercise as directed by your caregiver. Exercise is the most effective way to prevent or manage back pain. If you have a back problem, it is especially important to avoid sports that require sudden body movements. Swimming and walking are great activities.   Do not stand in one place for long periods of time.   Do not wear high heels.   Sit in chairs with good posture. Use a pillow on your lower back if necessary. Make sure your head  rests over your shoulders and is not hanging forward.   Try sleeping on your side, preferably the left side, with a pillow or two between your legs. If you are sore after a night's rest, your bedmay betoo soft.Try placing a board between your mattress and box spring.   Listen to your body when lifting.If you are experiencing pain, ask for help or try bending yourknees more so you can use your leg muscles rather than your back muscles. Squat down when picking up something from the floor. Do not bend over.   Eat a healthy diet. Try to gain weight within your caregiver's recommendations.   Use heat or cold packs 3 to 4 times a day for 15 minutes to help with the pain.   Only take over-the-counter or prescription medicines for pain, discomfort, or fever as directed by your caregiver.  Sudden (acute) back pain   Use bed rest for only the most extreme, acute episodes of back pain. Prolonged bed rest over 48 hours will aggravate your condition.   Ice is very effective for acute conditions.   Put ice in a plastic bag.   Place a towel between your skin and the bag.   Leave the ice on for 10 to 20 minutes every 2 hours, or as needed.   Using heat packs for 30 minutes prior to activities is also helpful.  Continued back pain  See your caregiver if you have continued problems. Your caregiver can help or refer you for appropriate physical therapy. With conditioning, most back problems can be avoided. Sometimes, a more serious issue may be the cause of back pain. You should be seen right away if new problems seem to be developing. Your caregiver may recommend:   A maternity girdle.   An elastic sling.   A back brace.   A massage therapist or acupuncture.  SEEK MEDICAL CARE IF:    You are not able to do most of your daily activities, even when taking the pain medicine you were given.   You need a referral to a physical therapist or chiropractor.   You want to try acupuncture.  SEEK IMMEDIATE MEDICAL CARE  IF:   You develop numbness, tingling, weakness, or problems with the use of your arms or legs.   You develop severe back pain that is no longer relieved with medicines.   You have a sudden change in bowel or bladder control.   You have increasing pain in other areas of the body.   You develop shortness of breath, dizziness, or fainting.   You develop nausea, vomiting, or sweating.   You have back pain which is similar to labor pains.   You have back pain along with your water breaking or vaginal bleeding.   You have back pain or numbness that travels down your leg.   Your back pain developed after   kidney stone.   You see blood in your urine. You may have a bladder infection or kidney stone.   You have back pain with blisters. You may have shingles.  Back pain is fairly common during pregnancy but should not be accepted as just part of the process. Back pain should always be treated as soon as possible. This will make your pregnancy as pleasant as possible. Document Released: 05/06/2005 Document Revised: 01/15/2011 Document Reviewed: 06/17/2010 [facility] Patient Information 2012 [place], [place].Back Exercises Back exercises help treat and prevent back injuries. The goal is to increase your strength in your belly (abdominal) and back muscles. These exercises can also help with flexibility. Start these exercises when told by your doctor. HOME CARE Back exercises include: Pelvic Tilt.  Lie on your back with your knees bent. Tilt your pelvis until the lower part of your back is against the floor. Hold this position 5 to 10 sec. Repeat this exercise 5 to 10 times.  Knee to Chest.  Pull 1 knee up against your chest and hold for 20 to 30 seconds. Repeat this with the other knee. This may be done with the other leg straight or bent, whichever feels better. Then, pull both knees up against  your chest.  Sit-Ups or Curl-Ups.  Bend your knees 90 degrees. Start with tilting your pelvis, and do a partial, slow sit-up. Only lift your upper half 30 to 45 degrees off the floor. Take at least 2 to 3 seonds for each sit-up. Do not do sit-ups with your knees out straight. If partial sit-ups are difficult, simply do the above but with only tightening your belly (abdominal) muscles and holding it as told.  Hip-Lift.  Lie on your back with your knees flexed 90 degrees. Push down with your feet and shoulders as you raise your hips 2 inches off the floor. Hold for 10 seconds, repeat 5 to 10 times.  Back Arches.  Lie on your stomach. Prop yourself up on bent elbows. Slowly press on your hands, causing an arch in your low back. Repeat 3 to 5 times.  Shoulder-Lifts.  Lie face down with arms beside your body. Keep hips and belly pressed to floor as you slowly lift your head and shoulders off the floor.  Do not overdo your exercises. Be careful in the beginning. Exercises may cause you some mild back discomfort. If the pain lasts for more than 15 minutes, stop the exercises until you see your doctor. Improvement with exercise for back problems is slow.  Document Released: 02/28/2010 Document Revised: 01/15/2011 Document Reviewed: 02/28/2010 [facility] Patient Information 2012 [place], [place].

## 2011-08-03 NOTE — MAU Note (Signed)
Pt states mid to low back pain and lower abd pain bilaterally since yesterday. Worse when lying down. Denies abnormal vaginal d/c changes or bleeding. Denies uti s/s.

## 2011-08-03 NOTE — MAU Note (Signed)
States has been seen several time in MAU. Last visit was told BHCG level and was advised to seek [facility]. States she has not been seen by a physician yet because she is waiting on Medicaid. Felt that she could not wait that long. Wants to know what is going on with her pain.

## 2011-08-22 ENCOUNTER — Inpatient Hospital Stay (HOSPITAL_COMMUNITY): Payer: Medicaid Other

## 2011-08-22 ENCOUNTER — Encounter (HOSPITAL_COMMUNITY): Payer: Self-pay | Admitting: *Deleted

## 2011-08-22 ENCOUNTER — Inpatient Hospital Stay (HOSPITAL_COMMUNITY)
Admission: AD | Admit: 2011-08-22 | Discharge: 2011-08-22 | Disposition: A | Discharge status: Home / Self Care | Payer: Medicaid Other | Source: Ambulatory Visit | Attending: Family Medicine | Admitting: Family Medicine

## 2011-08-22 DIAGNOSIS — O200 Threatened abortion: Secondary | ICD-10-CM | POA: Insufficient documentation

## 2011-08-22 DIAGNOSIS — O2390 Unspecified genitourinary tract infection in pregnancy, unspecified trimester: Secondary | ICD-10-CM | POA: Insufficient documentation

## 2011-08-22 DIAGNOSIS — N949 Unspecified condition associated with female genital organs and menstrual cycle: Secondary | ICD-10-CM

## 2011-08-22 DIAGNOSIS — N390 Urinary tract infection, site not specified: Secondary | ICD-10-CM | POA: Insufficient documentation

## 2011-08-22 DIAGNOSIS — R109 Unspecified abdominal pain: Secondary | ICD-10-CM | POA: Insufficient documentation

## 2011-08-22 LAB — WET PREP, GENITAL
Clue Cells Wet Prep HPF POC: NONE SEEN
Yeast Wet Prep HPF POC: NONE SEEN

## 2011-08-22 LAB — URINALYSIS, ROUTINE W REFLEX MICROSCOPIC
Glucose, UA: NEGATIVE mg/dL
Nitrite: NEGATIVE

## 2011-08-22 LAB — URINE MICROSCOPIC-ADD ON

## 2011-08-22 LAB — CBC
HCT: 32 % — ABNORMAL LOW (ref 36.0–46.0)
Hemoglobin: 10.3 g/dL — ABNORMAL LOW (ref 12.0–15.0)
RBC: 3.96 MIL/uL (ref 3.87–5.11)
WBC: 8.4 10*3/uL (ref 4.0–10.5)

## 2011-08-22 NOTE — MAU Note (Signed)
Feeling a "tearing sensation on the bottom of her stomach and around belly button area" x 4-5 days. Denies vag bleeding or discharge.

## 2011-08-22 NOTE — MAU Provider Note (Signed)
Chart reviewed and agree with management and plan.  

## 2011-08-22 NOTE — MAU Provider Note (Signed)
History     CSN: [id number]  Arrival date and time: 08/22/11 8413   First Provider Initiated Contact with Patient 08/22/11 0840     Chief Complaint  Patient presents with  . Abdominal Pain   HPI  Cynthia Scott 31 y.o. [redacted]w[redacted]d presenting today for lower abdominal pain with a "tearing sensation" into her vaginal with periumbilical pain x 3 days.  Denies N/V/D.  Has a hx of non-Hodgkin's lymphoma.  Is followed and cared for [facility] in [place].  Last blood work done in [place] last month showing an IDA for which she is supposed to get an iron transfusion on August 19th.  Patient states the pain and sensations she is having this pregnancy is "very different" from all of her others.  States some burning with urination that is "normal" for her.  Admits to drinking a lot of mountain dew.  Smokes 1/2ppd, but is not ready to quit at this time.    **States she had a blood transfusion last pregnancy and developed Antibody K for which they had to closely monitor her during pregnancy, but there were no pregnancy complications, child is 54 years old now.**  OB History    Grav Para Term Preterm Abortions TAB SAB Ect Mult Living   9 5 5  3  0 3   5      Past Medical History  Diagnosis Date  . Non-Hodgkin lymphoma   . Blood transfusion 01/25/2009  . 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 Cancer   . Heart murmur   . Urinary tract infection     Past Surgical History  Procedure Date  . Rhinoplasty   . Port-a-cath removal   . Chest wall tumor excision   . Tonsillectomy   . Wisdom tooth extraction 2010  . Appendectomy   . Cholecystectomy   . Tubes in ears   . Turmor in chest     Family History  Problem Relation Age of Onset  . Anesthesia problems Neg Hx   . Heart disease Mother   . Hearing loss Mother   . Heart disease Maternal Aunt   . Cancer Maternal Uncle   . Heart disease Maternal Grandmother     History  Substance Use Topics  . Smoking status: Current Some Day Smoker -- 0.5  packs/day for 13 years    Types: Cigarettes  . Smokeless tobacco: Never Used  . Alcohol Use: No    Allergies: No Known Allergies  Prescriptions prior to admission  Medication Sig Dispense Refill  . acetaminophen (TYLENOL) 500 MG tablet Take 1,000 mg by mouth every 6 (six) hours as needed. Takes for pain      . flintstones complete (FLINTSTONES) 60 MG chewable tablet Chew 3 tablets by mouth daily.        Review of Systems  Constitutional: Negative.   HENT: Negative.   Eyes: Negative.   Respiratory: Negative.   Cardiovascular: Negative.   Gastrointestinal:       See HPI.  Genitourinary: Positive for dysuria.       See HPI.  Musculoskeletal: Negative.   Skin: Negative.   Neurological: Negative.   Endo/Heme/Allergies:       See HPI.  Psychiatric/Behavioral: Negative.    Physical Exam   Blood pressure 111/52, pulse 90, temperature 97.5 F (36.4 C), temperature source Oral, resp. rate 18, height 5\' 2"  (1.575 m), weight 125.646 kg (277 lb), last menstrual period 05/27/2011.  Physical Exam  Constitutional: She is oriented to person, place, and time.  She appears well-developed and well-nourished.  HENT:  Head: Normocephalic and atraumatic.  Neck: Normal range of motion. Neck supple.  Cardiovascular: Normal rate, regular rhythm, normal heart sounds and intact distal pulses.  Exam reveals no gallop and no friction rub.   No murmur heard. Respiratory: Effort normal and breath sounds normal. No respiratory distress.  GI: Soft. Bowel sounds are normal. There is tenderness.       Tenderness noted with palpation over suprapubic region.  Genitourinary: Vagina normal.       Uterine exam limited by body habitus.  See below.  Neurological: She is alert and oriented to person, place, and time.  Skin: Skin is warm and dry.  Psychiatric: She has a normal mood and affect. Her behavior is normal.   Speculum exam: Vagina - Moderate amount of creamy milky white discharge, no odor. Cervix -  No contact bleeding noted.  Bimanual exam: Cervix closed and firm.  Uterus non tender, unable to evaluate size secondary to body habitus. Adnexa non tender, no masses bilaterally, but limited by body habitus. GC/Chlam, wet prep done. Chaperone present for exam.   MAU Course  Procedures  MDM  Results for orders placed during the hospital encounter of 08/22/11 (from the past 24 hour(s))  URINALYSIS, ROUTINE W REFLEX MICROSCOPIC     Status: Abnormal   Collection Time   08/22/11  8:24 AM      Component Value Range   Color, Urine YELLOW  YELLOW   APPearance HAZY (*) CLEAR   Specific Gravity, Urine 1.025  1.005 - 1.030   pH 6.0  5.0 - 8.0   Glucose, UA NEGATIVE  NEGATIVE mg/dL   Hgb urine dipstick NEGATIVE  NEGATIVE   Bilirubin Urine NEGATIVE  NEGATIVE   Ketones, ur NEGATIVE  NEGATIVE mg/dL   Protein, ur NEGATIVE  NEGATIVE mg/dL   Urobilinogen, UA 0.2  0.0 - 1.0 mg/dL   Nitrite NEGATIVE  NEGATIVE   Leukocytes, UA MODERATE (*) NEGATIVE  URINE MICROSCOPIC-ADD ON     Status: Abnormal   Collection Time   08/22/11  8:24 AM      Component Value Range   Squamous Epithelial / LPF MANY (*) RARE   WBC, UA 7-10  <3 WBC/hpf   RBC / HPF 0-2  <3 RBC/hpf   Bacteria, UA MANY (*) RARE  CBC     Status: Abnormal   Collection Time   08/22/11  9:04 AM      Component Value Range   WBC 8.4  4.0 - 10.5 K/uL   RBC 3.96  3.87 - 5.11 MIL/uL   Hemoglobin 10.3 (*) 12.0 - 15.0 g/dL   HCT 69.6 (*) 29.5 - 28.4 %   MCV 80.8  78.0 - 100.0 fL   MCH 26.0  26.0 - 34.0 pg   MCHC 32.2  30.0 - 36.0 g/dL   RDW 13.2 (*) 44.0 - 10.2 %   Platelets 301  150 - 400 K/uL  WET PREP, GENITAL     Status: Abnormal   Collection Time   08/22/11  9:35 AM      Component Value Range   Yeast Wet Prep HPF POC NONE SEEN  NONE SEEN   Trich, Wet Prep NONE SEEN  NONE SEEN   Clue Cells Wet Prep HPF POC NONE SEEN  NONE SEEN   WBC, Wet Prep HPF POC FEW (*) NONE SEEN     Assessment and Plan   Assessment:  Round Ligament  Pain  Differential Diagnoses: UTI -  UA and culture sent Pelvic Infection - Cultures and Wet Prep to R/O Threatened AB - [place] to verify fetal viability, R/O  Plan:  Quit smoking.  Increase water to 8-8 oz glasses daily.  Begin PNC as soon as possible.   Servando Salina 08/22/2011, 10:50 AM   I have reviewed this patient's vital signs, nurses notes, appropriate labs and imaging. I have examined this patient with the student and assisted the student with her assessment and plan of care.

## 2011-08-23 LAB — URINE CULTURE: Colony Count: 85000

## 2011-08-24 LAB — GC/CHLAMYDIA PROBE AMP, GENITAL
Chlamydia, DNA Probe: NEGATIVE
GC Probe Amp, Genital: NEGATIVE

## 2011-09-10 ENCOUNTER — Ambulatory Visit (INDEPENDENT_AMBULATORY_CARE_PROVIDER_SITE_OTHER): Payer: Medicaid Other | Admitting: Family Medicine

## 2011-09-10 ENCOUNTER — Encounter: Payer: Self-pay | Admitting: Family Medicine

## 2011-09-10 ENCOUNTER — Other Ambulatory Visit (HOSPITAL_COMMUNITY)
Admission: RE | Admit: 2011-09-10 | Discharge: 2011-09-10 | Disposition: A | Discharge status: Home / Self Care | Payer: Medicaid Other | Source: Ambulatory Visit | Attending: Family Medicine | Admitting: Family Medicine

## 2011-09-10 VITALS — BP 111/58 | Temp 97.8°F | Wt 268.8 lb

## 2011-09-10 DIAGNOSIS — O0990 Supervision of high risk pregnancy, unspecified, unspecified trimester: Secondary | ICD-10-CM

## 2011-09-10 DIAGNOSIS — Z1151 Encounter for screening for human papillomavirus (HPV): Secondary | ICD-10-CM | POA: Insufficient documentation

## 2011-09-10 DIAGNOSIS — O0940 Supervision of pregnancy with grand multiparity, unspecified trimester: Secondary | ICD-10-CM

## 2011-09-10 DIAGNOSIS — D649 Anemia, unspecified: Secondary | ICD-10-CM

## 2011-09-10 DIAGNOSIS — Z01419 Encounter for gynecological examination (general) (routine) without abnormal findings: Secondary | ICD-10-CM | POA: Insufficient documentation

## 2011-09-10 DIAGNOSIS — C8589 Other specified types of non-Hodgkin lymphoma, extranodal and solid organ sites: Secondary | ICD-10-CM

## 2011-09-10 DIAGNOSIS — O361190 Maternal care for Anti-A sensitization, unspecified trimester, not applicable or unspecified: Secondary | ICD-10-CM

## 2011-09-10 DIAGNOSIS — O99330 Smoking (tobacco) complicating pregnancy, unspecified trimester: Secondary | ICD-10-CM

## 2011-09-10 DIAGNOSIS — O99210 Obesity complicating pregnancy, unspecified trimester: Secondary | ICD-10-CM

## 2011-09-10 DIAGNOSIS — O361990 Maternal care for other isoimmunization, unspecified trimester, not applicable or unspecified: Secondary | ICD-10-CM | POA: Insufficient documentation

## 2011-09-10 DIAGNOSIS — C8590 Non-Hodgkin lymphoma, unspecified, unspecified site: Secondary | ICD-10-CM | POA: Insufficient documentation

## 2011-09-10 NOTE — Progress Notes (Signed)
Nutrition Note:  1st visit Diagnosis:  Obesity Wt gain wnl.  Client reports poor appetite.  Eats 1 meal/day.  Very nauseous Flinstones w/ Fe 3 per day.  Drinks 2% milk, water, some soda & tea.   Pt. Given verbal and written nutrition education re:  Nausea & general nutrition Plans to increase frequency of regular meals and snacks.   Discussed wt. Gain goals of 11-20# Currently receiving WIC F/U if referred. Wilford Sports, MPH, RD, LDN

## 2011-09-10 NOTE — Progress Notes (Signed)
MFM Consult scheduled September 17, 2011 at 930 am.

## 2011-09-10 NOTE — Progress Notes (Signed)
Subjective:    Cynthia Scott is a [id number] [redacted]w[redacted]d being seen today for her first obstetrical visit.  Her obstetrical history is significant for obesity, smoker and Non-Hodgkin's Lymphoma, Anti-kell antibody +. Pregnancy history fully reviewed.  Patient reports nausea.  Filed Vitals:   09/10/11 1016  BP: 111/58  Temp: 97.8 F (36.6 C)  Weight: 268 lb 12.8 oz (121.927 kg)    HISTORY: OB History    Grav Para Term Preterm Abortions TAB SAB Ect Mult Living   9 5 5  3  0 3   5     # Outc Date GA Lbr Len/2nd Wgt Sex Del Anes PTL Lv   1 TRM 7/02 [redacted]w[redacted]d  7lb14oz(3.572kg) M SVD None  Yes   2 TRM 8/04 [redacted]w[redacted]d  9lb12oz(4.423kg) F SVD None  Yes   3 TRM 8/05 [redacted]w[redacted]d  8lb7oz(3.827kg) M SVD None  Yes   4 TRM 8/08 [redacted]w[redacted]d  7lb(3.175kg) M SVD None  Yes   5 TRM 6/12 [redacted]w[redacted]d  5lb(2.268kg) M SVD None  Yes   6 SAB            7 SAB            8 SAB            9 CUR              Past Medical History  Diagnosis Date  . Blood transfusion 01/25/2009  . Urinary tract infection   . 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 Anemia   . Non-Hodgkin lymphoma   . 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 Cancer   . Non Hodgkin's lymphoma 2012   Past Surgical History  Procedure Date  . Rhinoplasty   . Port-a-cath removal   . Chest wall tumor excision   . Tonsillectomy   . Wisdom tooth extraction 2010  . Appendectomy   . Cholecystectomy   . Tubes in ears   . Turmor in chest    Family History  Problem Relation Age of Onset  . Anesthesia problems Neg Hx   . Heart disease Mother   . COPD Mother   . Diabetes Mother   . Heart disease Maternal Aunt   . Diabetes Maternal Aunt   . Cancer Maternal Uncle   . Heart disease Maternal Grandmother      Exam    Uterus:     Pelvic Exam:    Perineum: No Hemorrhoids   Vulva: Bartholin's, Urethra, Skene's normal, female escutcheon   Vagina:  normal mucosa       Cervix: multiparous appearance and no cervical motion tenderness   Adnexa: normal adnexa   Bony  Pelvis: average  System:     Skin: normal coloration and turgor, no rashes Multiple tattoos   Neurologic: oriented   Extremities: normal strength, tone, and muscle mass   HEENT sclera clear, anicteric   Mouth/Teeth mucous membranes moist, pharynx normal without lesions   Neck supple   Cardiovascular: regular rate and rhythm   Respiratory:  appears well, vitals normal, no respiratory distress, acyanotic, normal RR, ear and throat exam is normal, neck free of mass or lymphadenopathy, chest clear, no wheezing, crepitations, rhonchi, normal symmetric air entry   Abdomen: soft, non-tender; bowel sounds normal; no masses,  no organomegaly          Assessment:    Pregnancy: [id number] Patient Active Problem List  Diagnosis  . Unspecified high-risk pregnancy  . Grand multiparity with current pregnancy, antepartum  .  Non-Hodgkin lymphoma  . Tobacco use complicating pregnancy  . Maternal atypical antibody complicating pregnancy-Anti Kell  . Obesity complicating peripregnancy, antepartum        Plan:     Initial labs drawn. Prenatal vitamins. Problem list reviewed and updated. Genetic Screening discussed Quad Screen: undecided.  Ultrasound discussed; fetal survey: discussed.  Follow up in 2 weeks. MFM consult--they were seeing her during her last pregnancy and following Dopplers.  No real f/u done since completion of chemo for her lymphoma because of pregnancy.  Per pt, they will not see her until pregnancy is completed.  She is s/p chemo x 6 months, did not complete her XRT secondary to pregnancy.  Reportedly had several lymph nodes which were on her chest wall surgically removed.  Donie Lemelin S 09/10/2011

## 2011-09-10 NOTE — Progress Notes (Signed)
Pulse- 82 Pt does not have appetite Weight gain 11-20lbs  Education booklet, smoking cessations, tdap vaccine info given

## 2011-09-10 NOTE — Patient Instructions (Addendum)
Smoking Cessation This document explains the best ways for you to quit smoking and new treatments to help. It lists new medicines that can double or triple your chances of quitting and quitting for good. It also considers ways to avoid relapses and concerns you may have about quitting, including weight gain. NICOTINE: A POWERFUL ADDICTION If you have tried to quit smoking, you know how hard it can be. It is hard because nicotine is a very addictive drug. For some people, it can be as addictive as heroin or cocaine. Usually, people make 2 or 3 tries, or more, before finally being able to quit. Each time you try to quit, you can learn about what helps and what hurts. Quitting takes hard work and a lot of effort, but you can quit smoking. QUITTING SMOKING IS ONE OF THE MOST IMPORTANT THINGS YOU WILL EVER DO.  You will live longer, feel better, and live better.   The impact on your body of quitting smoking is felt almost immediately:   Within 20 minutes, blood pressure decreases. Pulse returns to its normal level.   After 8 hours, carbon monoxide levels in the blood return to normal. Oxygen level increases.   After 24 hours, chance of heart attack starts to decrease. Breath, hair, and body stop smelling like smoke.   After 48 hours, damaged nerve endings begin to recover. Sense of taste and smell improve.   After 72 hours, the body is virtually free of nicotine. Bronchial tubes relax and breathing becomes easier.   After 2 to 12 weeks, lungs can hold more air. Exercise becomes easier and circulation improves.   Quitting will reduce your risk of having a heart attack, stroke, cancer, or lung disease:   After 1 year, the risk of coronary heart disease is cut in half.   After 5 years, the risk of stroke falls to the same as a nonsmoker.   After 10 years, the risk of lung cancer is cut in half and the risk of other cancers decreases significantly.   After 15 years, the risk of coronary heart  disease drops, usually to the level of a nonsmoker.   If you are pregnant, quitting smoking will improve your chances of having a healthy baby.   The people you live with, especially your children, will be healthier.   You will have extra money to spend on things other than cigarettes.  FIVE KEYS TO QUITTING Studies have shown that these 5 steps will help you quit smoking and quit for good. You have the best chances of quitting if you use them together: 1. Get ready.  2. Get support and encouragement.  3. Learn new skills and behaviors.  4. Get medicine to reduce your nicotine addiction and use it correctly.  5. Be prepared for relapse or difficult situations. Be determined to continue trying to quit, even if you do not succeed at first.  1. GET READY  Set a quit date.   Change your environment.   Get rid of ALL cigarettes, ashtrays, matches, and lighters in your home, car, and place of work.   Do not let people smoke in your home.   Review your past attempts to quit. Think about what worked and what did not.   Once you quit, do not smoke. NOT EVEN A PUFF!  2. GET SUPPORT AND ENCOURAGEMENT Studies have shown that you have a better chance of being successful if you have help. You can get support in many ways.  Tell   your family, friends, and coworkers that you are going to quit and need their support. Ask them not to smoke around you.   Talk to your caregivers (doctor, dentist, nurse, pharmacist, psychologist, and/or smoking counselor).   Get individual, group, or telephone counseling and support. The more counseling you have, the better your chances are of quitting. Programs are available at local hospitals and health centers. Call your local health department for information about programs in your area.   Spiritual beliefs and practices may help some smokers quit.   Quit meters are small computer programs online or downloadable that keep track of quit statistics, such as amount  of "quit-time," cigarettes not smoked, and money saved.   Many smokers find one or more of the many self-help books available useful in helping them quit and stay off tobacco.  3. LEARN NEW SKILLS AND BEHAVIORS  Try to distract yourself from urges to smoke. Talk to someone, go for a walk, or occupy your time with a task.   When you first try to quit, change your routine. Take a different route to work. Drink tea instead of coffee. Eat breakfast in a different place.   Do something to reduce your stress. Take a hot bath, exercise, or read a book.   Plan something enjoyable to do every day. Reward yourself for not smoking.   Explore interactive web-based programs that specialize in helping you quit.  4. GET MEDICINE AND USE IT CORRECTLY Medicines can help you stop smoking and decrease the urge to smoke. Combining medicine with the above behavioral methods and support can quadruple your chances of successfully quitting smoking. The [place] Food and Drug Administration (FDA) has approved 7 medicines to help you quit smoking. These medicines fall into 3 categories.  Nicotine replacement therapy (delivers nicotine to your body without the negative effects and risks of smoking):   Nicotine gum: Available over-the-counter.   Nicotine lozenges: Available over-the-counter.   Nicotine inhaler: Available by prescription.   Nicotine nasal spray: Available by prescription.   Nicotine skin patches (transdermal): Available by prescription and over-the-counter.   Antidepressant medicine (helps people abstain from smoking, but how this works is unknown):   Bupropion sustained-release (SR) tablets: Available by prescription.   Nicotinic receptor partial agonist (simulates the effect of nicotine in your brain):   Varenicline tartrate tablets: Available by prescription.   Ask your caregiver for advice about which medicines to use and how to use them. Carefully read the information on the package.    Everyone who is trying to quit may benefit from using a medicine. If you are pregnant or trying to become pregnant, nursing an infant, you are under age 18, or you smoke fewer than 10 cigarettes per day, talk to your caregiver before taking any nicotine replacement medicines.   You should stop using a nicotine replacement product and call your caregiver if you experience nausea, dizziness, weakness, vomiting, fast or irregular heartbeat, mouth problems with the lozenge or gum, or redness or swelling of the skin around the patch that does not go away.   Do not use any other product containing nicotine while using a nicotine replacement product.   Talk to your caregiver before using these products if you have diabetes, heart disease, asthma, stomach ulcers, you had a recent heart attack, you have high blood pressure that is not controlled with medicine, a history of irregular heartbeat, or you have been prescribed medicine to help you quit smoking.  5. BE PREPARED FOR RELAPSE OR   DIFFICULT SITUATIONS  Most relapses occur within the first 3 months after quitting. Do not be discouraged if you start smoking again. Remember, most people try several times before they finally quit.   You may have symptoms of withdrawal because your body is used to nicotine. You may crave cigarettes, be irritable, feel very hungry, cough often, get headaches, or have difficulty concentrating.   The withdrawal symptoms are only temporary. They are strongest when you first quit, but they will go away within 10 to 14 days.  Here are some difficult situations to watch for:  Alcohol. Avoid drinking alcohol. Drinking lowers your chances of successfully quitting.   Caffeine. Try to reduce the amount of caffeine you consume. It also lowers your chances of successfully quitting.   Other smokers. Being around smoking can make you want to smoke. Avoid smokers.   Weight gain. Many smokers will gain weight when they quit, usually  less than 10 pounds. Eat a healthy diet and stay active. Do not let weight gain distract you from your main goal, quitting smoking. Some medicines that help you quit smoking may also help delay weight gain. You can always lose the weight gained after you quit.   Bad mood or depression. There are a lot of ways to improve your mood other than smoking.  If you are having problems with any of these situations, talk to your caregiver. SPECIAL SITUATIONS AND CONDITIONS Studies suggest that everyone can quit smoking. Your situation or condition can give you a special reason to quit.  Pregnant women/new mothers: By quitting, you protect your baby's health and your own.   Hospitalized patients: By quitting, you reduce health problems and help healing.   Heart attack patients: By quitting, you reduce your risk of a second heart attack.   Lung, head, and neck cancer patients: By quitting, you reduce your chance of a second cancer.   Parents of children and adolescents: By quitting, you protect your children from illnesses caused by secondhand smoke.  QUESTIONS TO THINK ABOUT Think about the following questions before you try to stop smoking. You may want to talk about your answers with your caregiver.  Why do you want to quit?   If you tried to quit in the past, what helped and what did not?   What will be the most difficult situations for you after you quit? How will you plan to handle them?   Who can help you through the tough times? Your family? Friends? Caregiver?   What pleasures do you get from smoking? What ways can you still get pleasure if you quit?  Here are some questions to ask your caregiver:  How can you help me to be successful at quitting?   What medicine do you think would be best for me and how should I take it?   What should I do if I need more help?   What is smoking withdrawal like? How can I get information on withdrawal?  Quitting takes hard work and a lot of effort,  but you can quit smoking. FOR MORE INFORMATION  Smokefree.gov (http://www.davis-sullivan.com/) provides free, accurate, evidence-based information and professional assistance to help support the immediate and long-term needs of people trying to quit smoking. Document Released: 01/20/2001 Document Revised: 01/15/2011 Document Reviewed: 11/12/2008 [facility] Patient Information 2012 [place], [place].  Pregnancy - Second Trimester The second trimester of pregnancy (3 to 6 months) is a period of rapid growth for you and your baby. At the end of the sixth  month, your baby is about 9 inches long and weighs 1 1/2 pounds. You will begin to feel the baby move between 18 and 20 weeks of the pregnancy. This is called quickening. Weight gain is faster. A clear fluid (colostrum) may leak out of your breasts. You may feel small contractions of the womb (uterus). This is known as false labor or Braxton-Hicks contractions. This is like a practice for labor when the baby is ready to be born. Usually, the problems with morning sickness have usually passed by the end of your first trimester. Some women develop small dark blotches (called cholasma, mask of pregnancy) on their face that usually goes away after the baby is born. Exposure to the sun makes the blotches worse. Acne may also develop in some pregnant women and pregnant women who have acne, may find that it goes away. PRENATAL EXAMS  Blood work may continue to be done during prenatal exams. These tests are done to check on your health and the probable health of your baby. Blood work is used to follow your blood levels (hemoglobin). Anemia (low hemoglobin) is common during pregnancy. Iron and vitamins are given to help prevent this. You will also be checked for diabetes between 24 and 28 weeks of the pregnancy. Some of the previous blood tests may be repeated.   The size of the uterus is measured during each visit. This is to make sure that the baby is continuing to grow  properly according to the dates of the pregnancy.   Your blood pressure is checked every prenatal visit. This is to make sure you are not getting toxemia.   Your urine is checked to make sure you do not have an infection, diabetes or protein in the urine.   Your weight is checked often to make sure gains are happening at the suggested rate. This is to ensure that both you and your baby are growing normally.   Sometimes, an ultrasound is performed to confirm the proper growth and development of the baby. This is a test which bounces harmless sound waves off the baby so your caregiver can more accurately determine due dates.  Sometimes, a specialized test is done on the amniotic fluid surrounding the baby. This test is called an amniocentesis. The amniotic fluid is obtained by sticking a needle into the belly (abdomen). This is done to check the chromosomes in instances where there is a concern about possible genetic problems with the baby. It is also sometimes done near the end of pregnancy if an early delivery is required. In this case, it is done to help make sure the baby's lungs are mature enough for the baby to live outside of the womb. CHANGES OCCURING IN THE SECOND TRIMESTER OF PREGNANCY Your body goes through many changes during pregnancy. They vary from person to person. Talk to your caregiver about changes you notice that you are concerned about.  During the second trimester, you will likely have an increase in your appetite. It is normal to have cravings for certain foods. This varies from person to person and pregnancy to pregnancy.   Your lower abdomen will begin to bulge.   You may have to urinate more often because the uterus and baby are pressing on your bladder. It is also common to get more bladder infections during pregnancy (pain with urination). You can help this by drinking lots of fluids and emptying your bladder before and after intercourse.   You may begin to get stretch  marks  on your hips, abdomen, and breasts. These are normal changes in the body during pregnancy. There are no exercises or medications to take that prevent this change.   You may begin to develop swollen and bulging veins (varicose veins) in your legs. Wearing support hose, elevating your feet for 15 minutes, 3 to 4 times a day and limiting salt in your diet helps lessen the problem.   Heartburn may develop as the uterus grows and pushes up against the stomach. Antacids recommended by your caregiver helps with this problem. Also, eating smaller meals 4 to 5 times a day helps.   Constipation can be treated with a stool softener or adding bulk to your diet. Drinking lots of fluids, vegetables, fruits, and whole grains are helpful.   Exercising is also helpful. If you have been very active up until your pregnancy, most of these activities can be continued during your pregnancy. If you have been less active, it is helpful to start an exercise program such as walking.   Hemorrhoids (varicose veins in the rectum) may develop at the end of the second trimester. Warm sitz baths and hemorrhoid cream recommended by your caregiver helps hemorrhoid problems.   Backaches may develop during this time of your pregnancy. Avoid heavy lifting, wear low heal shoes and practice good posture to help with backache problems.   Some pregnant women develop tingling and numbness of their hand and fingers because of swelling and tightening of ligaments in the wrist (carpel tunnel syndrome). This goes away after the baby is born.   As your breasts enlarge, you may have to get a bigger bra. Get a comfortable, cotton, support bra. Do not get a nursing bra until the last month of the pregnancy if you will be nursing the baby.   You may get a dark line from your belly button to the pubic area called the linea nigra.   You may develop rosy cheeks because of increase blood flow to the face.   You may develop spider looking  lines of the face, neck, arms and chest. These go away after the baby is born.  HOME CARE INSTRUCTIONS   It is extremely important to avoid all smoking, herbs, alcohol, and unprescribed drugs during your pregnancy. These chemicals affect the formation and growth of the baby. Avoid these chemicals throughout the pregnancy to ensure the delivery of a healthy infant.   Most of your home care instructions are the same as suggested for the first trimester of your pregnancy. Keep your caregiver's appointments. Follow your caregiver's instructions regarding medication use, exercise and diet.   During pregnancy, you are providing food for you and your baby. Continue to eat regular, well-balanced meals. Choose foods such as meat, fish, milk and other low fat dairy products, vegetables, fruits, and whole-grain breads and cereals. Your caregiver will tell you of the ideal weight gain.   A physical sexual relationship may be continued up until near the end of pregnancy if there are no other problems. Problems could include early (premature) leaking of amniotic fluid from the membranes, vaginal bleeding, abdominal pain, or other medical or pregnancy problems.   Exercise regularly if there are no restrictions. Check with your caregiver if you are unsure of the safety of some of your exercises. The greatest weight gain will occur in the last 2 trimesters of pregnancy. Exercise will help you:   Control your weight.   Get you in shape for labor and delivery.   Lose weight after you  have the baby.   Wear a good support or jogging bra for breast tenderness during pregnancy. This may help if worn during sleep. Pads or tissues may be used in the bra if you are leaking colostrum.   Do not use hot tubs, steam rooms or saunas throughout the pregnancy.   Wear your seat belt at all times when driving. This protects you and your baby if you are in an accident.   Avoid raw meat, uncooked cheese, cat litter boxes and  soil used by cats. These carry germs that can cause birth defects in the baby.   The second trimester is also a good time to visit your dentist for your dental health if this has not been done yet. Getting your teeth cleaned is OK. Use a soft toothbrush. Brush gently during pregnancy.   It is easier to loose urine during pregnancy. Tightening up and strengthening the pelvic muscles will help with this problem. Practice stopping your urination while you are going to the bathroom. These are the same muscles you need to strengthen. It is also the muscles you would use as if you were trying to stop from passing gas. You can practice tightening these muscles up 10 times a set and repeating this about 3 times per day. Once you know what muscles to tighten up, do not perform these exercises during urination. It is more likely to contribute to an infection by backing up the urine.   Ask for help if you have financial, counseling or nutritional needs during pregnancy. Your caregiver will be able to offer counseling for these needs as well as refer you for other special needs.   Your skin may become oily. If so, wash your face with mild soap, use non-greasy moisturizer and oil or cream based makeup.  MEDICATIONS AND DRUG USE IN PREGNANCY  Take prenatal vitamins as directed. The vitamin should contain 1 milligram of folic acid. Keep all vitamins out of reach of children. Only a couple vitamins or tablets containing iron may be fatal to a baby or young child when ingested.   Avoid use of all medications, including herbs, over-the-counter medications, not prescribed or suggested by your caregiver. Only take over-the-counter or prescription medicines for pain, discomfort, or fever as directed by your caregiver. Do not use aspirin.   Let your caregiver also know about herbs you may be using.   Alcohol is related to a number of birth defects. This includes fetal alcohol syndrome. All alcohol, in any form, should be  avoided completely. Smoking will cause low birth rate and premature babies.   Street or illegal drugs are very harmful to the baby. They are absolutely forbidden. A baby born to an addicted mother will be addicted at birth. The baby will go through the same withdrawal an adult does.  SEEK MEDICAL CARE IF:  You have any concerns or worries during your pregnancy. It is better to call with your questions if you feel they cannot wait, rather than worry about them. SEEK IMMEDIATE MEDICAL CARE IF:   An unexplained oral temperature above 102 F (38.9 C) develops, or as your caregiver suggests.   You have leaking of fluid from the vagina (birth canal). If leaking membranes are suspected, take your temperature and tell your caregiver of this when you call.   There is vaginal spotting, bleeding, or passing clots. Tell your caregiver of the amount and how many pads are used. Light spotting in pregnancy is common, especially following intercourse.  You develop a bad smelling vaginal discharge with a change in the color from clear to white.   You continue to feel sick to your stomach (nauseated) and have no relief from remedies suggested. You vomit blood or coffee ground-like materials.   You lose more than 2 pounds of weight or gain more than 2 pounds of weight over 1 week, or as suggested by your caregiver.   You notice swelling of your face, hands, feet, or legs.   You get exposed to [place] measles and have never had them.   You are exposed to fifth disease or chickenpox.   You develop belly (abdominal) pain. Round ligament discomfort is a common non-cancerous (benign) cause of abdominal pain in pregnancy. Your caregiver still must evaluate you.   You develop a bad headache that does not go away.   You develop fever, diarrhea, pain with urination, or shortness of breath.   You develop visual problems, blurry, or double vision.   You fall or are in a car accident or any kind of trauma.    There is mental or physical violence at home.  Document Released: 01/20/2001 Document Revised: 01/15/2011 Document Reviewed: 07/25/2008 [facility] Patient Information 2012 [place], [place]. Pregnancy and Smoking Smoking during pregnancy is very unhealthy for the mother and the developing fetus. The addictive drug in cigarettes (nicotine), carbon monoxide, and many other poisons are inhaled from a cigarette and are carried through your bloodstream to your fetus. Cigarette smoke contains more than 2,500 chemicals. It is not known which of these chemicals are harmful to the developing fetus. However, both nicotine and carbon monoxide play a role in causing health problems in pregnancy. Effects on the fetus of smoking during pregnancy:  Decrease in blood flow and oxygen to the uterus, placenta, and your fetus.   Increased heart rate of the fetus.   Slowing of your fetus's growth in the uterus (intrauterine growth retardation).   Placental problems. Placenta may partially cover or completely cover the opening to the cervix (placenta previa), or the placenta may partially or completely separate from the uterus (placental abruption).   Increase risk of pregnancy outside of the uterus (tubal pregnancy).   Premature rupture membranes, causing the sac that holds the fetus to break too early, resulting in premature birth and increased health risks to the newborn.   Increased risk of birth defects, including heart defects.   Increased risk of miscarriage.  Newborns born to women who smoke during pregnancy:  Are more likely to be born too early (prematurely).   Are more likely to be at a low birth weight.   Are at risk for serious health problems, chronic or lifelong disabilities (cerebral palsy, mental retardation, learning problems), and possibly even death   Are at risk of Sudden Infant Death Syndrome (SIDS).   Have higher rates of miscarriage and stillbirth.   Have more lung and breathing  (respiratory) problems.  Long-term effects on a child's behavior: Some of the following trends are seen with children of smoking mothers:  Increased risk for drug abuse, behavior, and conduct disorders.   Increased risk for smoking in adolescent girls.   Increased risk for negative behavior in 2-year-olds.   Increase risk for asthma, colic, and childhood obesity, which can lead to diabetes.   Increased risk for finger and toe disorders.  Resources to stop smoking during pregnancy:  Counseling.   Psychological treatment.   Acupuncture.   Family intervention.   Hypnosis.   Medicines that are safe to  take during pregnancy. Nicotine supplements have not been studied enough. They should only be considered when all other methods fail.   Telephone QUIT lines.  Smoking and Breastfeeding:  Nicotine gets passed to the infant through a mother's breastmilk. This can cause nausea, colic, cramping, and diarrhea in the infant.   Smoking may reduce milk supply and interfere with the let-down response.   Even formula-fed infants of mothers who smoke have nicotine and cotinine (nicotine by-product) in their urine.  Other resources to help stop smoking:  American Cancer Society: www.cancer.org   American Heart Association: www.americanheart.org   National Cancer Institute: www.cancer.gov   Smoke Free Families: www.smokefreefamilies.[address] [place] Line): 2266304182 START  Document Released: 06/09/2004 Document Revised: 01/15/2011 Document Reviewed: 11/07/2008 [facility] Patient Information 2012 [place], [place]. Contraception Choices Contraception (birth control) is the use of any methods or devices to prevent pregnancy. Below are some methods to help avoid pregnancy. HORMONAL METHODS   Contraceptive implant. This is a thin, plastic tube containing progesterone hormone. It does not contain estrogen hormone. Your caregiver inserts the tube in the inner part of the upper arm. The  tube can remain in place for up to 3 years. After 3 years, the implant must be removed. The implant prevents the ovaries from releasing an egg (ovulation), thickens the cervical mucus which prevents sperm from entering the uterus, and thins the lining of the inside of the uterus.   Progesterone-only injections. These injections are given every 3 months by your caregiver to prevent pregnancy. This synthetic progesterone hormone stops the ovaries from releasing eggs. It also thickens cervical mucus and changes the uterine lining. This makes it harder for sperm to survive in the uterus.   Birth control pills. These pills contain estrogen and progesterone hormone. They work by stopping the egg from forming in the ovary (ovulation). Birth control pills are prescribed by a caregiver.Birth control pills can also be used to treat heavy periods.   Minipill. This type of birth control pill contains only the progesterone hormone. They are taken every day of each month and must be prescribed by your caregiver.   Birth control patch. The patch contains hormones similar to those in birth control pills. It must be changed once a week and is prescribed by a caregiver.   Vaginal ring. The ring contains hormones similar to those in birth control pills. It is left in the vagina for 3 weeks, removed for 1 week, and then a new one is put back in place. The patient must be comfortable inserting and removing the ring from the vagina.A caregiver's prescription is necessary.   Emergency contraception. Emergency contraceptives prevent pregnancy after unprotected sexual intercourse. This pill can be taken right after sex or up to 5 days after unprotected sex. It is most effective the sooner you take the pills after having sexual intercourse. Emergency contraceptive pills are available without a prescription. Check with your pharmacist. Do not use emergency contraception as your only form of birth control.  BARRIER METHODS    Female condom. This is a thin sheath (latex or rubber) that is worn over the penis during sexual intercourse. It can be used with spermicide to increase effectiveness.   Female condom. This is a soft, loose-fitting sheath that is put into the vagina before sexual intercourse.   Diaphragm. This is a soft, latex, dome-shaped barrier that must be fitted by a caregiver. It is inserted into the vagina, along with a spermicidal jelly. It is inserted before intercourse.  The diaphragm should be left in the vagina for 6 to 8 hours after intercourse.   Cervical cap. This is a round, soft, latex or plastic cup that fits over the cervix and must be fitted by a caregiver. The cap can be left in place for up to 48 hours after intercourse.   Sponge. This is a soft, circular piece of polyurethane foam. The sponge has spermicide in it. It is inserted into the vagina after wetting it and before sexual intercourse.   Spermicides. These are chemicals that kill or block sperm from entering the cervix and uterus. They come in the form of creams, jellies, suppositories, foam, or tablets. They do not require a prescription. They are inserted into the vagina with an applicator before having sexual intercourse. The process must be repeated every time you have sexual intercourse.  INTRAUTERINE CONTRACEPTION  Intrauterine device (IUD). This is a T-shaped device that is put in a woman's uterus during a menstrual period to prevent pregnancy. There are 2 types:   Copper IUD. This type of IUD is wrapped in copper wire and is placed inside the uterus. Copper makes the uterus and fallopian tubes produce a fluid that kills sperm. It can stay in place for 10 years.   Hormone IUD. This type of IUD contains the hormone progestin (synthetic progesterone). The hormone thickens the cervical mucus and prevents sperm from entering the uterus, and it also thins the uterine lining to prevent implantation of a fertilized egg. The hormone can  weaken or kill the sperm that get into the uterus. It can stay in place for 5 years.  PERMANENT METHODS OF CONTRACEPTION  Female tubal ligation. This is when the woman's fallopian tubes are surgically sealed, tied, or blocked to prevent the egg from traveling to the uterus.   Female sterilization. This is when the female has the tubes that carry sperm tied off (vasectomy).This blocks sperm from entering the vagina during sexual intercourse. After the procedure, the man can still ejaculate fluid (semen).  NATURAL PLANNING METHODS  Natural family planning. This is not having sexual intercourse or using a barrier method (condom, diaphragm, cervical cap) on days the woman could become pregnant.   Calendar method. This is keeping track of the length of each menstrual cycle and identifying when you are fertile.   Ovulation method. This is avoiding sexual intercourse during ovulation.   Symptothermal method. This is avoiding sexual intercourse during ovulation, using a thermometer and ovulation symptoms.   Post-ovulation method. This is timing sexual intercourse after you have ovulated.  Regardless of which type or method of contraception you choose, it is important that you use condoms to protect against the transmission of sexually transmitted diseases (STDs). Talk with your caregiver about which form of contraception is most appropriate for you. Document Released: 01/26/2005 Document Revised: 01/15/2011 Document Reviewed: 06/04/2010 [facility] Patient Information 2012 [place], [place]. Breastfeeding BENEFITS OF BREASTFEEDING For the baby  The first milk (colostrum) helps the baby's digestive system function better.   There are antibodies from the mother in the milk that help the baby fight off infections.   The baby has a lower incidence of asthma, allergies, and SIDS (sudden infant death syndrome).   The nutrients in breast milk are better than formulas for the baby and helps the baby's brain  grow better.   Babies who breastfeed have less gas, colic, and constipation.  For the mother  Breastfeeding helps develop a very special bond between mother and baby.   It  is more convenient, always available at the correct temperature and cheaper than formula feeding.   It burns calories in the mother and helps with losing weight that was gained during pregnancy.   It makes the uterus contract back down to normal size faster and slows bleeding following delivery.   Breastfeeding mothers have a lower risk of developing breast cancer.  NURSE FREQUENTLY  A healthy, full-term baby may breastfeed as often as every hour or space his or her feedings to every 3 hours.   How often to nurse will vary from baby to baby. Watch your baby for signs of hunger, not the clock.   Nurse as often as the baby requests, or when you feel the need to reduce the fullness of your breasts.   Awaken the baby if it has been 3 to 4 hours since the last feeding.   Frequent feeding will help the mother make more milk and will prevent problems like sore nipples and engorgement of the breasts.  BABY'S POSITION AT THE BREAST  Whether lying down or sitting, be sure that the baby's tummy is facing your tummy.   Support the breast with 4 fingers underneath the breast and the thumb above. Make sure your fingers are well away from the nipple and baby's mouth.   Stroke the baby's lips and cheek closest to the breast gently with your finger or nipple.   When the baby's mouth is open wide enough, place all of your nipple and as much of the dark area around the nipple as possible into your baby's mouth.   Pull the baby in close so the tip of the nose and the baby's cheeks touch the breast during the feeding.  FEEDINGS  The length of each feeding varies from baby to baby and from feeding to feeding.   The baby must suck about 2 to 3 minutes for your milk to get to him or her. This is called a "let down." For this  reason, allow the baby to feed on each breast as long as he or she wants. Your baby will end the feeding when he or she has received the right balance of nutrients.   To break the suction, put your finger into the corner of the baby's mouth and slide it between his or her gums before removing your breast from his or her mouth. This will help prevent sore nipples.  REDUCING BREAST ENGORGEMENT  In the first week after your baby is born, you may experience signs of breast engorgement. When breasts are engorged, they feel heavy, warm, full, and may be tender to the touch. You can reduce engorgement if you:   Nurse frequently, every 2 to 3 hours. Mothers who breastfeed early and often have fewer problems with engorgement.   Place light ice packs on your breasts between feedings. This reduces swelling. Wrap the ice packs in a lightweight towel to protect your skin.   Apply moist hot packs to your breast for 5 to 10 minutes before each feeding. This increases circulation and helps the milk flow.   Gently massage your breast before and during the feeding.   Make sure that the baby empties at least one breast at every feeding before switching sides.   Use a breast pump to empty the breasts if your baby is sleepy or not nursing well. You may also want to pump if you are returning to work or or you feel you are getting engorged.   Avoid bottle feeds, pacifiers  or supplemental feedings of water or juice in place of breastfeeding.   Be sure the baby is latched on and positioned properly while breastfeeding.   Prevent fatigue, stress, and anemia.   Wear a supportive bra, avoiding underwire styles.   Eat a balanced diet with enough fluids.  If you follow these suggestions, your engorgement should improve in 24 to 48 hours. If you are still experiencing difficulty, call your lactation consultant or caregiver. IS MY BABY GETTING ENOUGH MILK? Sometimes, mothers worry about whether their babies are  getting enough milk. You can be assured that your baby is getting enough milk if:  The baby is actively sucking and you hear swallowing.   The baby nurses at least 8 to 12 times in a 24 hour time period. Nurse your baby until he or she unlatches or falls asleep at the first breast (at least 10 to 20 minutes), then offer the second side.   The baby is wetting 5 to 6 disposable diapers (6 to 8 cloth diapers) in a 24 hour period by 12 to 10 days of age.   The baby is having at least 2 to 3 stools every 24 hours for the first few months. Breast milk is all the food your baby needs. It is not necessary for your baby to have water or formula. In fact, to help your breasts make more milk, it is best not to give your baby supplemental feedings during the early weeks.   The stool should be soft and yellow.   The baby should gain 4 to 7 ounces per week after he is 89 days old.  TAKE CARE OF YOURSELF Take care of your breasts by:  Bathing or showering daily.   Avoiding the use of soaps on your nipples.   Start feedings on your left breast at one feeding and on your right breast at the next feeding.   You will notice an increase in your milk supply 2 to 5 days after delivery. You may feel some discomfort from engorgement, which makes your breasts very firm and often tender. Engorgement "peaks" out within 24 to 48 hours. In the meantime, apply warm moist towels to your breasts for 5 to 10 minutes before feeding. Gentle massage and expression of some milk before feeding will soften your breasts, making it easier for your baby to latch on. Wear a well fitting nursing bra and air dry your nipples for 10 to 15 minutes after each feeding.   Only use cotton bra pads.   Only use pure lanolin on your nipples after nursing. You do not need to wash it off before nursing.  Take care of yourself by:   Eating well-balanced meals and nutritious snacks.   Drinking milk, fruit juice, and water to satisfy your thirst  (about 8 glasses a day).   Getting plenty of rest.   Increasing calcium in your diet (1200 mg a day).   Avoiding foods that you notice affect the baby in a bad way.  SEEK MEDICAL CARE IF:   You have any questions or difficulty with breastfeeding.   You need help.   You have a hard, red, sore area on your breast, accompanied by a fever of 100.5 F (38.1 C) or more.   Your baby is too sleepy to eat well or is having trouble sleeping.   Your baby is wetting less than 6 diapers per day, by 24 days of age.   Your baby's skin or white part of his  or her eyes is more yellow than it was in the hospital.   You feel depressed.  Document Released: 01/26/2005 Document Revised: 01/15/2011 Document Reviewed: 09/10/2008 [facility] Patient Information 2012 [place], [place].

## 2011-09-11 LAB — OBSTETRIC PANEL
Antibody Screen: NEGATIVE
Basophils Absolute: 0 10*3/uL (ref 0.0–0.1)
Eosinophils Relative: 2 % (ref 0–5)
HCT: 31 % — ABNORMAL LOW (ref 36.0–46.0)
Hemoglobin: 10.6 g/dL — ABNORMAL LOW (ref 12.0–15.0)
Lymphocytes Relative: 20 % (ref 12–46)
MCV: 78.7 fL (ref 78.0–100.0)
Monocytes Absolute: 0.6 10*3/uL (ref 0.1–1.0)
Monocytes Relative: 7 % (ref 3–12)
Neutro Abs: 6.2 10*3/uL (ref 1.7–7.7)
RDW: 16.9 % — ABNORMAL HIGH (ref 11.5–15.5)
Rh Type: POSITIVE
Rubella: 71.9 IU/mL — ABNORMAL HIGH
WBC: 8.7 10*3/uL (ref 4.0–10.5)

## 2011-09-11 LAB — POCT URINALYSIS DIP (DEVICE)
Leukocytes, UA: NEGATIVE
Nitrite: NEGATIVE
Protein, ur: NEGATIVE mg/dL
Urobilinogen, UA: 0.2 mg/dL (ref 0.0–1.0)
pH: 7 (ref 5.0–8.0)

## 2011-09-11 LAB — GC/CHLAMYDIA PROBE AMP, GENITAL: Chlamydia, DNA Probe: NEGATIVE

## 2011-09-11 LAB — HIV ANTIBODY (ROUTINE TESTING W REFLEX): HIV: NONREACTIVE

## 2011-09-13 LAB — CULTURE, OB URINE

## 2011-09-14 ENCOUNTER — Other Ambulatory Visit: Payer: Self-pay | Admitting: Family Medicine

## 2011-09-14 ENCOUNTER — Telehealth: Payer: Self-pay | Admitting: *Deleted

## 2011-09-14 DIAGNOSIS — R8271 Bacteriuria: Secondary | ICD-10-CM

## 2011-09-14 MED ORDER — CEPHALEXIN 500 MG PO CAPS: 500.0000 mg | ORAL_CAPSULE | Freq: Three times a day (TID) | ORAL | Status: AC

## 2011-09-14 NOTE — Telephone Encounter (Signed)
Message copied by Gerome Apley on Mon Sep 14, 2011 10:36 AM ------      Message from: Reva Bores      Created: Mon Sep 14, 2011  8:12 AM       Needs treatment for UTI--will send to pharm--please inform pt. To pick up

## 2011-09-14 NOTE — Telephone Encounter (Signed)
Called patient home number and left message we are trying to reach you with some information from your provider- non urgent, please call clinic during office hours.

## 2011-09-15 ENCOUNTER — Encounter: Payer: Self-pay | Admitting: Family Medicine

## 2011-09-15 NOTE — Telephone Encounter (Signed)
Called pt and left message that I was calling with test results and medication information. Rx has been sent to her pharmacy- please obtain and take according to directions. She may call back for additional information if desired.

## 2011-09-17 ENCOUNTER — Ambulatory Visit (HOSPITAL_COMMUNITY): Payer: Medicaid Other | Attending: Family Medicine

## 2011-09-24 ENCOUNTER — Encounter: Payer: Self-pay | Admitting: Family

## 2011-09-24 ENCOUNTER — Ambulatory Visit (INDEPENDENT_AMBULATORY_CARE_PROVIDER_SITE_OTHER): Payer: Medicaid Other | Admitting: Family

## 2011-09-24 VITALS — BP 129/77 | Temp 98.1°F | Wt 270.6 lb

## 2011-09-24 DIAGNOSIS — O361190 Maternal care for Anti-A sensitization, unspecified trimester, not applicable or unspecified: Secondary | ICD-10-CM

## 2011-09-24 DIAGNOSIS — E669 Obesity, unspecified: Secondary | ICD-10-CM

## 2011-09-24 DIAGNOSIS — O0990 Supervision of high risk pregnancy, unspecified, unspecified trimester: Secondary | ICD-10-CM

## 2011-09-24 NOTE — Progress Notes (Signed)
P= 86 Pain/presure in lower abdomen Edema - feet

## 2011-09-24 NOTE — Progress Notes (Signed)
Pt missed last MFM appt, rescheduled for 09/29/11.  No questions or concerns; desires quad screen, obtained today.  Schedule ultrasound with MFM next week.

## 2011-09-28 ENCOUNTER — Encounter (HOSPITAL_COMMUNITY): Payer: Self-pay | Admitting: Family Medicine

## 2011-09-29 ENCOUNTER — Ambulatory Visit (HOSPITAL_COMMUNITY): Payer: Medicaid Other

## 2011-09-30 ENCOUNTER — Ambulatory Visit (HOSPITAL_COMMUNITY)
Admission: RE | Admit: 2011-09-30 | Discharge: 2011-09-30 | Disposition: A | Discharge status: Home / Self Care | Payer: Medicaid Other | Source: Ambulatory Visit | Attending: Family Medicine | Admitting: Family Medicine

## 2011-09-30 VITALS — BP 116/53 | HR 92 | Wt 272.0 lb

## 2011-09-30 DIAGNOSIS — C8590 Non-Hodgkin lymphoma, unspecified, unspecified site: Secondary | ICD-10-CM

## 2011-09-30 DIAGNOSIS — O361990 Maternal care for other isoimmunization, unspecified trimester, not applicable or unspecified: Secondary | ICD-10-CM

## 2011-09-30 NOTE — Consult Note (Signed)
Maternal Fetal Medicine Consultation  Requesting Provider(s): Tinnie Gens, MD  Reason for consultation: anti-Kell antibodies, hx of non-Hodgkin's lymphoma  HPI: Cynthia Scott is a 31 year-old [id number] currently at 45 3/7 weeks who is seen for consultation due to hx of Kell antibodies and Non-Hodgkin's lymphoma.  The patient reports that she was first diagnosed with Non Hodgkin's lymphoma while in [place] in 2010 with large neck mass.  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  During the course of her treatment, she required a blood transfusion which is felt to be the source of her Kell antibodies.  While undergoing treatment, she reports that she developed "clots" in the arm and lower extremities and was treated with both Lovinox and Coumadin (records unavailable for review at the time of consultation).  The patient was scheduled to undergo XRT - but was never able to start treatment due to her frequent pregnancies.  She is currently followed by Heme/Onc at [facility] and the plan is for further evaluation after delivery and possibly radiation therapy.  She reports that she is currently without evidence of disease, but has been unable to undergo a full evaluation due to pregnancies.  The patient reports a history of iron deficiency anemia - was scheduled to receive a course of IV iron, but missed her appointment at [facility].  She is without complaints today.  OB History: OB History    Grav Para Term Preterm Abortions TAB SAB Ect Mult Living   9 5 5  3  0 3   5     Term SVD without complications x 4.  Last pregnancy (2012) complicated by ? Kell antibodies and MCA Dopplers.  Reports that the baby had "anemia" but did not require transfusions  PMH:  Past Medical History  Diagnosis Date  . Blood transfusion 01/25/2009  . Urinary tract infection   . 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 Anemia   . Non-Hodgkin lymphoma   . Lymphoma,  Hodgkin's 11-24-2008  . Cancer   . Non Hodgkin's lymphoma 2012    PSH:  Past Surgical History  Procedure Date  . Rhinoplasty   . Port-a-cath removal   . Chest wall tumor excision   . Tonsillectomy   . Wisdom tooth extraction 2010  . Appendectomy   . Cholecystectomy   . Tubes in ears   . Turmor in chest    Meds:  Current outpatient prescriptions:acetaminophen (TYLENOL) 500 MG tablet, Take 1,000 mg by mouth every 6 (six) hours as needed. Takes for pain, Disp: , Rfl: ;  flintstones complete (FLINTSTONES) 60 MG chewable tablet, Chew 3 tablets by mouth daily., Disp: , Rfl:   Allergies: No Known Allergies  FH: unknown  Soc: Smokes 1/2 ppd, denies alcohol or illicit drug use  Review of Systems: no vaginal bleeding or cramping/contractions, no LOF, no nausea/vomiting. All other systems reviewed and are negative.  PNL: Initial antibody screen negative for Kell antibodies.  Repeat pending.   PE:  VS: BP       116/53              pulse    92               Weight 272# GEN: well-appearing female   A/P:  1) Kell antibodies - antibodies not noted on initial pregnancy screen this pregnancy - would suspect low titer based on this result.  Repeat antibody screen is apparently pending.  The patient reports that the father of the baby would be willing to undergo testing - will come with her for her follow up ultrasound appointment next week - plan to test the father of the baby for Kell antigens at that time.  Kell alloimmunization is thought to cause fetal anemia by both to a hemolytic process as well as marrow suppression.  If the father of the baby does not carry Kell antigens, there is no further need for follow up or testing.  If he does carry Kell antigens, would recommend MCA Doppler testing beginning at approximately [redacted] weeks gestation regardless of the level of the titer.  2) ? Hx of DVT / upper extremity clots - previously on Lovinox and Coumadin.  Records requested.  Pending record  review, will begin Lovinox 40 mg daily for prophylaxis.    3) Non-Hodgkin's Lymphoma - will need follow up with Heme/Onc at [facility].  The patient was strongly encouraged to arrange follow up for IV Iron infusion due to chronic iron deficiency anemia.  Based on our conversation, the patient admits that she has completed her child-bearing and would be interested in tubal ligation.  4) Smoking history - patient encouraged to stop smoking.   Based on the patients multiple co-morbidities recommend serial growth scans and antepartum fetal testing during the 3rd trimester.    Thank you for the opportunity to be a part of the care of [employer] Please contact our office if we can be of further assistance.   I spent approximately 30 minutes with this patient with over 50% of time spent in face-to-face counseling.  Alpha Gula, MD

## 2011-10-03 ENCOUNTER — Encounter: Payer: Self-pay | Admitting: Family

## 2011-10-03 DIAGNOSIS — Z8759 Personal history of other complications of pregnancy, childbirth and the puerperium: Secondary | ICD-10-CM

## 2011-10-03 DIAGNOSIS — Z86718 Personal history of other venous thrombosis and embolism: Secondary | ICD-10-CM | POA: Insufficient documentation

## 2011-10-06 ENCOUNTER — Ambulatory Visit (HOSPITAL_COMMUNITY)
Admission: RE | Admit: 2011-10-06 | Discharge: 2011-10-06 | Disposition: A | Discharge status: Home / Self Care | Payer: Medicaid Other | Source: Ambulatory Visit | Attending: Family Medicine | Admitting: Family Medicine

## 2011-10-06 ENCOUNTER — Other Ambulatory Visit: Payer: Self-pay

## 2011-10-06 ENCOUNTER — Ambulatory Visit (HOSPITAL_COMMUNITY)
Admission: RE | Admit: 2011-10-06 | Discharge: 2011-10-06 | Disposition: A | Discharge status: Home / Self Care | Payer: Medicaid Other | Source: Ambulatory Visit | Attending: Family | Admitting: Family

## 2011-10-06 VITALS — BP 121/62 | HR 86 | Wt 274.5 lb

## 2011-10-06 DIAGNOSIS — O0990 Supervision of high risk pregnancy, unspecified, unspecified trimester: Secondary | ICD-10-CM

## 2011-10-06 DIAGNOSIS — O361990 Maternal care for other isoimmunization, unspecified trimester, not applicable or unspecified: Secondary | ICD-10-CM

## 2011-10-06 DIAGNOSIS — C8590 Non-Hodgkin lymphoma, unspecified, unspecified site: Secondary | ICD-10-CM

## 2011-10-06 DIAGNOSIS — Z1389 Encounter for screening for other disorder: Secondary | ICD-10-CM | POA: Insufficient documentation

## 2011-10-06 DIAGNOSIS — Z363 Encounter for antenatal screening for malformations: Secondary | ICD-10-CM | POA: Insufficient documentation

## 2011-10-06 DIAGNOSIS — O2620 Pregnancy care for patient with recurrent pregnancy loss, unspecified trimester: Secondary | ICD-10-CM | POA: Insufficient documentation

## 2011-10-06 DIAGNOSIS — E669 Obesity, unspecified: Secondary | ICD-10-CM | POA: Insufficient documentation

## 2011-10-06 DIAGNOSIS — O358XX0 Maternal care for other (suspected) fetal abnormality and damage, not applicable or unspecified: Secondary | ICD-10-CM | POA: Insufficient documentation

## 2011-10-06 DIAGNOSIS — Z86718 Personal history of other venous thrombosis and embolism: Secondary | ICD-10-CM

## 2011-10-06 NOTE — Progress Notes (Signed)
Cynthia Scott  was seen today for an ultrasound appointment.  See full report in [facility].  Alpha Gula, MD  Single IUP at 18 2/7 weeks Normal detailed fetal anatomy; however, somewhat limited views of the fetal heart were obtained No markers associated with aneuploidy noted Normal amniotic fluid volume  Recommend follow up ultrasound in 4 weeks for anatomy. Kell antigen testing drawn on the father of the baby today - if positive, will need MCA Dopplers screening at next follow up.

## 2011-10-09 ENCOUNTER — Encounter: Payer: Self-pay | Admitting: Family

## 2011-10-14 ENCOUNTER — Telehealth (HOSPITAL_COMMUNITY): Payer: Self-pay | Admitting: *Deleted

## 2011-10-14 DIAGNOSIS — Z86718 Personal history of other venous thrombosis and embolism: Secondary | ICD-10-CM

## 2011-10-14 NOTE — Telephone Encounter (Signed)
Pt called in requesting lab results from last week. Reviewed labs with pt.  Discussed with pt that we are waiting on her records from [facility] to determine further treatment with lovenox.  Pt to follow up with regular OB appointment and I will call pt back when records come in.

## 2011-10-15 ENCOUNTER — Encounter: Payer: Self-pay | Admitting: Obstetrics & Gynecology

## 2011-10-18 ENCOUNTER — Inpatient Hospital Stay (HOSPITAL_COMMUNITY)
Admission: AD | Admit: 2011-10-18 | Discharge: 2011-10-19 | Disposition: A | Discharge status: Home / Self Care | Payer: Medicaid Other | Source: Ambulatory Visit | Attending: Obstetrics & Gynecology | Admitting: Obstetrics & Gynecology

## 2011-10-18 ENCOUNTER — Encounter (HOSPITAL_COMMUNITY): Payer: Self-pay | Admitting: *Deleted

## 2011-10-18 DIAGNOSIS — G4700 Insomnia, unspecified: Secondary | ICD-10-CM | POA: Insufficient documentation

## 2011-10-18 DIAGNOSIS — R109 Unspecified abdominal pain: Secondary | ICD-10-CM | POA: Insufficient documentation

## 2011-10-18 DIAGNOSIS — O26899 Other specified pregnancy related conditions, unspecified trimester: Secondary | ICD-10-CM

## 2011-10-18 DIAGNOSIS — O99891 Other specified diseases and conditions complicating pregnancy: Secondary | ICD-10-CM | POA: Insufficient documentation

## 2011-10-18 DIAGNOSIS — Z8759 Personal history of other complications of pregnancy, childbirth and the puerperium: Secondary | ICD-10-CM

## 2011-10-18 NOTE — MAU Note (Signed)
PT SAYS SHE STARTED  HURTING IN LOWER ABD-    THIS IS 3RD TIME  TO MAU FOR SAME  C/O.     FEELS LIKE PAIN IS WORSE SINCE SHE STARTED TAKING  LOVENOX- SHE STOPPED  HERSELF X5 DAYS.

## 2011-10-18 NOTE — MAU Note (Signed)
PT HAS AN APPOINTMENT ON 9-12 AT [facility]-   MISSED HER LAST APPOINTMENT - BECAUSE OF CHILD'S APPOINTMENT

## 2011-10-19 ENCOUNTER — Telehealth (HOSPITAL_COMMUNITY): Payer: Self-pay | Admitting: *Deleted

## 2011-10-19 ENCOUNTER — Encounter (HOSPITAL_COMMUNITY): Payer: Self-pay | Admitting: *Deleted

## 2011-10-19 DIAGNOSIS — Z86718 Personal history of other venous thrombosis and embolism: Secondary | ICD-10-CM

## 2011-10-19 DIAGNOSIS — Z8759 Personal history of other complications of pregnancy, childbirth and the puerperium: Secondary | ICD-10-CM

## 2011-10-19 LAB — URINALYSIS, ROUTINE W REFLEX MICROSCOPIC
Bilirubin Urine: NEGATIVE
Glucose, UA: NEGATIVE mg/dL
Ketones, ur: NEGATIVE mg/dL
Nitrite: NEGATIVE
pH: 6 (ref 5.0–8.0)

## 2011-10-19 MED ORDER — HYDROCODONE-ACETAMINOPHEN 5-325 MG PO TABS
1.0000 | ORAL_TABLET | Freq: Once | ORAL | Status: AC
  Administered 2011-10-19: 1 via ORAL
  Filled 2011-10-19: qty 1

## 2011-10-19 MED ORDER — ZOLPIDEM TARTRATE 5 MG PO TABS
5.0000 mg | ORAL_TABLET | Freq: Every evening | ORAL | Status: DC | PRN
  Administered 2011-10-19: 5 mg via ORAL
  Filled 2011-10-19: qty 1

## 2011-10-19 MED ORDER — HYDROCODONE-ACETAMINOPHEN 7.5-325 MG PO TABS: 1.0000 | ORAL_TABLET | Freq: Once | ORAL | Status: DC

## 2011-10-19 MED ORDER — ZOLPIDEM TARTRATE 5 MG PO TABS: 5.0000 mg | ORAL_TABLET | Freq: Every evening | ORAL | Status: DC | PRN

## 2011-10-19 NOTE — Addendum Note (Signed)
Encounter addended by: Candis Shine, MD on: 10/19/2011  9:04 AM<BR>     Documentation filed: Inpatient Notes

## 2011-10-19 NOTE — Telephone Encounter (Signed)
Instructed pt to discontinue lovenox per Dr. Claudean Severance.  Pt verbalized understanding.  She has an appointment with her Primary OB on 9/12 at 10am.

## 2011-10-19 NOTE — MAU Provider Note (Signed)
Attestation of Attending Supervision of Advanced Practitioner (CNM/NP): Evaluation and management procedures were performed by the Advanced Practitioner under my supervision and collaboration.  I have reviewed the Advanced Practitioner's note and chart, and I agree with the management and plan.  HARRAWAY-SMITH, Vernice Bowker 2:55 AM

## 2011-10-19 NOTE — MAU Provider Note (Signed)
History     CSN: [id number]  Arrival date and time: 10/18/11 2323   First Provider Initiated Contact with Patient 10/19/11 0026      No chief complaint on file.  HPI  Pt is a [id number] at 20.1 wks IUP here with lower bilateral groin pain that increases with standing.  Also reports restless at night with difficulty sleeping.  Denies vaginal bleeding, leaking of fluid, or contractions.  Also states that she stopped taking lovenox 5 days ago because she thought it might be causing the pain, however pain has continued.   Past Medical History  Diagnosis Date  . Blood transfusion 01/25/2009  . Urinary tract infection   . 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 Anemia   . Non-Hodgkin lymphoma   . 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 Cancer   . Non Hodgkin's lymphoma 2012  . DVT (deep venous thrombosis)     Past Surgical History  Procedure Date  . Rhinoplasty   . Port-a-cath removal   . Chest wall tumor excision   . Tonsillectomy   . Wisdom tooth extraction 2010  . Appendectomy   . Cholecystectomy   . Tubes in ears   . Turmor in chest     Family History  Problem Relation Age of Onset  . Anesthesia problems Neg Hx   . Heart disease Mother   . COPD Mother   . Diabetes Mother   . Heart disease Maternal Aunt   . Diabetes Maternal Aunt   . Cancer Maternal Uncle   . Heart disease Maternal Grandmother     History  Substance Use Topics  . Smoking status: Current Some Day Smoker -- 0.5 packs/day for 13 years    Types: Cigarettes  . Smokeless tobacco: Never Used  . Alcohol Use: No    Allergies: No Known Allergies  Prescriptions prior to admission  Medication Sig Dispense Refill  . acetaminophen (TYLENOL) 500 MG tablet Take 1,000 mg by mouth every 6 (six) hours as needed. Takes for pain      . enoxaparin (LOVENOX) 40 MG/0.4ML injection Inject 40 mg into the skin daily.      . flintstones complete (FLINTSTONES) 60 MG chewable tablet Chew 3 tablets by mouth daily.         Review of Systems  Musculoskeletal:       Groin pain, bilat  Psychiatric/Behavioral: The patient has insomnia.   All other systems reviewed and are negative.   Physical Exam   Blood pressure 120/56, pulse 87, temperature 97.5 F (36.4 C), temperature source Oral, resp. rate 20, height 5\' 1"  (1.549 m), weight 124.966 kg (275 lb 8 oz), last menstrual period 05/15/2011, unknown if currently breastfeeding.  Physical Exam  Constitutional: She is oriented to person, place, and time. She appears well-developed and well-nourished. No distress.       Moving around in bed, restlessly  HENT:  Head: Normocephalic.  Neck: Normal range of motion. Neck supple.  Cardiovascular: Normal rate, regular rhythm and normal heart sounds.   Respiratory: Effort normal and breath sounds normal.  GI: Soft. There is no tenderness.  Genitourinary: No bleeding around the vagina.       closed  Neurological: She is alert and oriented to person, place, and time.  Skin: Skin is warm and dry.    MAU Course  Procedures Results for orders placed during the hospital encounter of 10/18/11 (from the past 24 hour(s))  URINALYSIS, ROUTINE W REFLEX MICROSCOPIC  Status: Abnormal   Collection Time   10/19/11 12:00 AM      Component Value Range   Color, Urine YELLOW  YELLOW   APPearance CLEAR  CLEAR   Specific Gravity, Urine >1.030 (*) 1.005 - 1.030   pH 6.0  5.0 - 8.0   Glucose, UA NEGATIVE  NEGATIVE mg/dL   Hgb urine dipstick NEGATIVE  NEGATIVE   Bilirubin Urine NEGATIVE  NEGATIVE   Ketones, ur NEGATIVE  NEGATIVE mg/dL   Protein, ur NEGATIVE  NEGATIVE mg/dL   Urobilinogen, UA 0.2  0.0 - 1.0 mg/dL   Nitrite NEGATIVE  NEGATIVE   Leukocytes, UA NEGATIVE  NEGATIVE    Assessment and Plan  Round Ligament Pain Insomnia  Plan: DC to home RX Ambien Restart Lovenox Keep scheduled appt in clinic on Thursday  [facility] 10/19/2011, 2:14 AM

## 2011-10-19 NOTE — Consult Note (Signed)
Addendum:  Outpatient/ inpatient records from [facility] reviewed.  Ms. 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 Non compliance vs. Pregnancies).  She was briefly treated with Lovinox and Coumadin for a porta cath blood clot.  I can find no documentation of a prior DVT.  Additionally, the father of the baby tested negative for Kell antigen - no need for further follow up due to Kell alloimmunization.  Based on this information, feel that the Lovinox can be discontinued.  Would strongly recommend follow up with Heme-Onc at [facility].  At the time of my initial evaluation, she was scheduled for IV iron infusion due to chronic iron deficiency anemia, but missed her appointment.  Alpha Gula, MD

## 2011-10-22 ENCOUNTER — Ambulatory Visit (INDEPENDENT_AMBULATORY_CARE_PROVIDER_SITE_OTHER): Payer: Medicaid Other | Admitting: Obstetrics and Gynecology

## 2011-10-22 VITALS — BP 125/75 | Wt 273.7 lb

## 2011-10-22 DIAGNOSIS — Z3009 Encounter for other general counseling and advice on contraception: Secondary | ICD-10-CM

## 2011-10-22 DIAGNOSIS — O0940 Supervision of pregnancy with grand multiparity, unspecified trimester: Secondary | ICD-10-CM

## 2011-10-22 DIAGNOSIS — O361990 Maternal care for other isoimmunization, unspecified trimester, not applicable or unspecified: Secondary | ICD-10-CM

## 2011-10-22 DIAGNOSIS — O0990 Supervision of high risk pregnancy, unspecified, unspecified trimester: Secondary | ICD-10-CM

## 2011-10-22 DIAGNOSIS — O99210 Obesity complicating pregnancy, unspecified trimester: Secondary | ICD-10-CM

## 2011-10-22 DIAGNOSIS — O99330 Smoking (tobacco) complicating pregnancy, unspecified trimester: Secondary | ICD-10-CM

## 2011-10-22 DIAGNOSIS — C8590 Non-Hodgkin lymphoma, unspecified, unspecified site: Secondary | ICD-10-CM

## 2011-10-22 DIAGNOSIS — O361190 Maternal care for Anti-A sensitization, unspecified trimester, not applicable or unspecified: Secondary | ICD-10-CM

## 2011-10-22 DIAGNOSIS — C8589 Other specified types of non-Hodgkin lymphoma, extranodal and solid organ sites: Secondary | ICD-10-CM

## 2011-10-22 LAB — POCT URINALYSIS DIP (DEVICE)
Hgb urine dipstick: NEGATIVE
Ketones, ur: NEGATIVE mg/dL
Protein, ur: NEGATIVE mg/dL
Specific Gravity, Urine: 1.02 (ref 1.005–1.030)
pH: 7 (ref 5.0–8.0)

## 2011-10-22 NOTE — Progress Notes (Signed)
Pulse: 96 Pt c/o having no appetite. Has some pelvic discomfort.

## 2011-10-22 NOTE — Progress Notes (Signed)
Patient doing well without complaints. Patient reports having poor appetite although she eats three meals a day but not as much as she is used to. Patient encouraged to consume Ensure drinks if she feels she is not eating enough, although no weight loss documented yet.   F/u ultrasound 9/24

## 2011-11-03 ENCOUNTER — Ambulatory Visit (HOSPITAL_COMMUNITY): Payer: Medicaid Other

## 2011-11-04 ENCOUNTER — Ambulatory Visit (HOSPITAL_COMMUNITY)
Admission: RE | Admit: 2011-11-04 | Discharge: 2011-11-04 | Disposition: A | Discharge status: Home / Self Care | Payer: Medicaid Other | Source: Ambulatory Visit | Attending: Family | Admitting: Family

## 2011-11-04 VITALS — BP 114/67 | HR 95 | Wt 271.5 lb

## 2011-11-04 DIAGNOSIS — O2620 Pregnancy care for patient with recurrent pregnancy loss, unspecified trimester: Secondary | ICD-10-CM | POA: Insufficient documentation

## 2011-11-04 DIAGNOSIS — Z3009 Encounter for other general counseling and advice on contraception: Secondary | ICD-10-CM

## 2011-11-04 DIAGNOSIS — C8590 Non-Hodgkin lymphoma, unspecified, unspecified site: Secondary | ICD-10-CM

## 2011-11-04 DIAGNOSIS — O361990 Maternal care for other isoimmunization, unspecified trimester, not applicable or unspecified: Secondary | ICD-10-CM

## 2011-11-04 DIAGNOSIS — Z86718 Personal history of other venous thrombosis and embolism: Secondary | ICD-10-CM

## 2011-11-04 DIAGNOSIS — E669 Obesity, unspecified: Secondary | ICD-10-CM | POA: Insufficient documentation

## 2011-11-04 DIAGNOSIS — O99210 Obesity complicating pregnancy, unspecified trimester: Secondary | ICD-10-CM | POA: Insufficient documentation

## 2011-11-04 NOTE — Progress Notes (Signed)
Ms. Robideau was seen for ultrasound appointment today.  Please see [facility] report for details.

## 2011-11-12 ENCOUNTER — Ambulatory Visit (INDEPENDENT_AMBULATORY_CARE_PROVIDER_SITE_OTHER): Payer: Medicaid Other | Admitting: Family Medicine

## 2011-11-12 VITALS — BP 134/73 | Temp 98.0°F | Wt 272.0 lb

## 2011-11-12 DIAGNOSIS — O0990 Supervision of high risk pregnancy, unspecified, unspecified trimester: Secondary | ICD-10-CM

## 2011-11-12 DIAGNOSIS — O0940 Supervision of pregnancy with grand multiparity, unspecified trimester: Secondary | ICD-10-CM

## 2011-11-12 LAB — POCT URINALYSIS DIP (DEVICE)
Hgb urine dipstick: NEGATIVE
Protein, ur: NEGATIVE mg/dL
Specific Gravity, Urine: 1.015 (ref 1.005–1.030)
Urobilinogen, UA: 0.2 mg/dL (ref 0.0–1.0)
pH: 7 (ref 5.0–8.0)

## 2011-11-12 MED ORDER — OXYCODONE-ACETAMINOPHEN 5-325 MG PO TABS: 1.0000 | ORAL_TABLET | Freq: Four times a day (QID) | ORAL | Status: DC | PRN

## 2011-11-12 NOTE — Progress Notes (Signed)
P=98, c/o edema in feet only, c/o numbness in hands at times, c/o usual pelvic pressure, Declines flu shot

## 2011-11-12 NOTE — Patient Instructions (Signed)
Pregnancy - Second Trimester The second trimester of pregnancy (3 to 6 months) is a period of rapid growth for you and your baby. At the end of the sixth month, your baby is about 9 inches long and weighs 1 1/2 pounds. You will begin to feel the baby move between 18 and 20 weeks of the pregnancy. This is called quickening. Weight gain is faster. A clear fluid (colostrum) may leak out of your breasts. You may feel small contractions of the womb (uterus). This is known as false labor or Braxton-Hicks contractions. This is like a practice for labor when the baby is ready to be born. Usually, the problems with morning sickness have usually passed by the end of your first trimester. Some women develop small dark blotches (called cholasma, mask of pregnancy) on their face that usually goes away after the baby is born. Exposure to the sun makes the blotches worse. Acne may also develop in some pregnant women and pregnant women who have acne, may find that it goes away. PRENATAL EXAMS  Blood work may continue to be done during prenatal exams. These tests are done to check on your health and the probable health of your baby. Blood work is used to follow your blood levels (hemoglobin). Anemia (low hemoglobin) is common during pregnancy. Iron and vitamins are given to help prevent this. You will also be checked for diabetes between 24 and 28 weeks of the pregnancy. Some of the previous blood tests may be repeated.  The size of the uterus is measured during each visit. This is to make sure that the baby is continuing to grow properly according to the dates of the pregnancy.  Your blood pressure is checked every prenatal visit. This is to make sure you are not getting toxemia.  Your urine is checked to make sure you do not have an infection, diabetes or protein in the urine.  Your weight is checked often to make sure gains are happening at the suggested rate. This is to ensure that both you and your baby are growing  normally.  Sometimes, an ultrasound is performed to confirm the proper growth and development of the baby. This is a test which bounces harmless sound waves off the baby so your caregiver can more accurately determine due dates. Sometimes, a specialized test is done on the amniotic fluid surrounding the baby. This test is called an amniocentesis. The amniotic fluid is obtained by sticking a needle into the belly (abdomen). This is done to check the chromosomes in instances where there is a concern about possible genetic problems with the baby. It is also sometimes done near the end of pregnancy if an early delivery is required. In this case, it is done to help make sure the baby's lungs are mature enough for the baby to live outside of the womb. CHANGES OCCURING IN THE SECOND TRIMESTER OF PREGNANCY Your body goes through many changes during pregnancy. They vary from person to person. Talk to your caregiver about changes you notice that you are concerned about.  During the second trimester, you will likely have an increase in your appetite. It is normal to have cravings for certain foods. This varies from person to person and pregnancy to pregnancy.  Your lower abdomen will begin to bulge.  You may have to urinate more often because the uterus and baby are pressing on your bladder. It is also common to get more bladder infections during pregnancy (pain with urination). You can help this by   drinking lots of fluids and emptying your bladder before and after intercourse.  You may begin to get stretch marks on your hips, abdomen, and breasts. These are normal changes in the body during pregnancy. There are no exercises or medications to take that prevent this change.  You may begin to develop swollen and bulging veins (varicose veins) in your legs. Wearing support hose, elevating your feet for 15 minutes, 3 to 4 times a day and limiting salt in your diet helps lessen the problem.  Heartburn may develop  as the uterus grows and pushes up against the stomach. Antacids recommended by your caregiver helps with this problem. Also, eating smaller meals 4 to 5 times a day helps.  Constipation can be treated with a stool softener or adding bulk to your diet. Drinking lots of fluids, vegetables, fruits, and whole grains are helpful.  Exercising is also helpful. If you have been very active up until your pregnancy, most of these activities can be continued during your pregnancy. If you have been less active, it is helpful to start an exercise program such as walking.  Hemorrhoids (varicose veins in the rectum) may develop at the end of the second trimester. Warm sitz baths and hemorrhoid cream recommended by your caregiver helps hemorrhoid problems.  Backaches may develop during this time of your pregnancy. Avoid heavy lifting, wear low heal shoes and practice good posture to help with backache problems.  Some pregnant women develop tingling and numbness of their hand and fingers because of swelling and tightening of ligaments in the wrist (carpel tunnel syndrome). This goes away after the baby is born.  As your breasts enlarge, you may have to get a bigger bra. Get a comfortable, cotton, support bra. Do not get a nursing bra until the last month of the pregnancy if you will be nursing the baby.  You may get a dark line from your belly button to the pubic area called the linea nigra.  You may develop rosy cheeks because of increase blood flow to the face.  You may develop spider looking lines of the face, neck, arms and chest. These go away after the baby is born. HOME CARE INSTRUCTIONS   It is extremely important to avoid all smoking, herbs, alcohol, and unprescribed drugs during your pregnancy. These chemicals affect the formation and growth of the baby. Avoid these chemicals throughout the pregnancy to ensure the delivery of a healthy infant.  Most of your home care instructions are the same as  suggested for the first trimester of your pregnancy. Keep your caregiver's appointments. Follow your caregiver's instructions regarding medication use, exercise and diet.  During pregnancy, you are providing food for you and your baby. Continue to eat regular, well-balanced meals. Choose foods such as meat, fish, milk and other low fat dairy products, vegetables, fruits, and whole-grain breads and cereals. Your caregiver will tell you of the ideal weight gain.  A physical sexual relationship may be continued up until near the end of pregnancy if there are no other problems. Problems could include early (premature) leaking of amniotic fluid from the membranes, vaginal bleeding, abdominal pain, or other medical or pregnancy problems.  Exercise regularly if there are no restrictions. Check with your caregiver if you are unsure of the safety of some of your exercises. The greatest weight gain will occur in the last 2 trimesters of pregnancy. Exercise will help you:  Control your weight.  Get you in shape for labor and delivery.  Lose weight   after you have the baby.  Wear a good support or jogging bra for breast tenderness during pregnancy. This may help if worn during sleep. Pads or tissues may be used in the bra if you are leaking colostrum.  Do not use hot tubs, steam rooms or saunas throughout the pregnancy.  Wear your seat belt at all times when driving. This protects you and your baby if you are in an accident.  Avoid raw meat, uncooked cheese, cat litter boxes and soil used by cats. These carry germs that can cause birth defects in the baby.  The second trimester is also a good time to visit your dentist for your dental health if this has not been done yet. Getting your teeth cleaned is OK. Use a soft toothbrush. Brush gently during pregnancy.  It is easier to loose urine during pregnancy. Tightening up and strengthening the pelvic muscles will help with this problem. Practice stopping your  urination while you are going to the bathroom. These are the same muscles you need to strengthen. It is also the muscles you would use as if you were trying to stop from passing gas. You can practice tightening these muscles up 10 times a set and repeating this about 3 times per day. Once you know what muscles to tighten up, do not perform these exercises during urination. It is more likely to contribute to an infection by backing up the urine.  Ask for help if you have financial, counseling or nutritional needs during pregnancy. Your caregiver will be able to offer counseling for these needs as well as refer you for other special needs.  Your skin may become oily. If so, wash your face with mild soap, use non-greasy moisturizer and oil or cream based makeup. MEDICATIONS AND DRUG USE IN PREGNANCY  Take prenatal vitamins as directed. The vitamin should contain 1 milligram of folic acid. Keep all vitamins out of reach of children. Only a couple vitamins or tablets containing iron may be fatal to a baby or young child when ingested.  Avoid use of all medications, including herbs, over-the-counter medications, not prescribed or suggested by your caregiver. Only take over-the-counter or prescription medicines for pain, discomfort, or fever as directed by your caregiver. Do not use aspirin.  Let your caregiver also know about herbs you may be using.  Alcohol is related to a number of birth defects. This includes fetal alcohol syndrome. All alcohol, in any form, should be avoided completely. Smoking will cause low birth rate and premature babies.  Street or illegal drugs are very harmful to the baby. They are absolutely forbidden. A baby born to an addicted mother will be addicted at birth. The baby will go through the same withdrawal an adult does. SEEK MEDICAL CARE IF:  You have any concerns or worries during your pregnancy. It is better to call with your questions if you feel they cannot wait, rather  than worry about them. SEEK IMMEDIATE MEDICAL CARE IF:   An unexplained oral temperature above 102 F (38.9 C) develops, or as your caregiver suggests.  You have leaking of fluid from the vagina (birth canal). If leaking membranes are suspected, take your temperature and tell your caregiver of this when you call.  There is vaginal spotting, bleeding, or passing clots. Tell your caregiver of the amount and how many pads are used. Light spotting in pregnancy is common, especially following intercourse.  You develop a bad smelling vaginal discharge with a change in the color from clear   to white.  You continue to feel sick to your stomach (nauseated) and have no relief from remedies suggested. You vomit blood or coffee ground-like materials.  You lose more than 2 pounds of weight or gain more than 2 pounds of weight over 1 week, or as suggested by your caregiver.  You notice swelling of your face, hands, feet, or legs.  You get exposed to [place] measles and have never had them.  You are exposed to fifth disease or chickenpox.  You develop belly (abdominal) pain. Round ligament discomfort is a common non-cancerous (benign) cause of abdominal pain in pregnancy. Your caregiver still must evaluate you.  You develop a bad headache that does not go away.  You develop fever, diarrhea, pain with urination, or shortness of breath.  You develop visual problems, blurry, or double vision.  You fall or are in a car accident or any kind of trauma.  There is mental or physical violence at home. Document Released: 01/20/2001 Document Revised: 04/20/2011 Document Reviewed: 07/25/2008 [facility] Patient Information 2013 [place], [place].  Tension Headache A tension headache is a feeling of pain, pressure, or aching often felt over the front and sides of the head. The pain can be dull or can feel tight (constricting). It is the most common type of headache. Tension headaches are not normally associated  with nausea or vomiting and do not get worse with physical activity. Tension headaches can last 30 minutes to several days.  CAUSES  The exact cause is not known, but it may be caused by chemicals and hormones in the brain that lead to pain. Tension headaches often begin after stress, anxiety, or depression. Other triggers may include:  Alcohol.  Caffeine (too much or withdrawal).  Respiratory infections (colds, flu, sinus infections).  Dental problems or teeth clenching.  Fatigue.  Holding your head and neck in one position too long while using a computer. SYMPTOMS   Pressure around the head.   Dull, aching head pain.   Pain felt over the front and sides of the head.   Tenderness in the muscles of the head, neck, and shoulders. DIAGNOSIS  A tension headache is often diagnosed based on:   Symptoms.   Physical examination.   A CT scan or MRI of your head. These tests may be ordered if symptoms are severe or unusual. TREATMENT  Medicines may be given to help relieve symptoms.  HOME CARE INSTRUCTIONS   Only take over-the-counter or prescription medicines for pain or discomfort as directed by your caregiver.   Lie down in a dark, quiet room when you have a headache.   Keep a journal to find out what may be triggering your headaches. For example, write down:  What you eat and drink.  How much sleep you get.  Any change to your diet or medicines.  Try massage or other relaxation techniques.   Ice packs or heat applied to the head and neck can be used. Use these 3 to 4 times per day for 15 to 20 minutes each time, or as needed.   Limit stress.   Sit up straight, and do not tense your muscles.   Quit smoking if you smoke.  Limit alcohol use.  Decrease the amount of caffeine you drink, or stop drinking caffeine.  Eat and exercise regularly.  Get 7 to 9 hours of sleep, or as recommended by your caregiver.  Avoid excessive use of pain medicine as  recurrent headaches can occur.  SEEK MEDICAL CARE IF:  You have problems with the medicines you were prescribed.  Your medicines do not work.  You have a change from the usual headache.  You have nausea or vomiting. SEEK IMMEDIATE MEDICAL CARE IF:   Your headache becomes severe.  You have a fever.  You have a stiff neck.  You have loss of vision.  You have muscular weakness or loss of muscle control.  You lose your balance or have trouble walking.  You feel faint or pass out.  You have severe symptoms that are different from your first symptoms. MAKE SURE YOU:   Understand these instructions.  Will watch your condition.  Will get help right away if you are not doing well or get worse. Document Released: 01/26/2005 Document Revised: 04/20/2011 Document Reviewed: 01/16/2011 [facility] Patient Information 2013 [place], [place].

## 2011-11-12 NOTE — Progress Notes (Signed)
Pt denies contractions, bleeding, leaking of fluid. Baby moving well. New complaint of headache for past 3 or 4 days. No vision changes. No RUQ pain. Has history of migraines but has not had one in a while. No photophobia, some nausea. Tylenol, heat/ice not working. Percocet rx given for limited quantity. Anti-Kell antibodies. Last sono normal growth. FOB is negative for Kell antibodies so no further testing needed per Dr. Claudean Severance.

## 2011-11-13 ENCOUNTER — Telehealth: Payer: Self-pay | Admitting: *Deleted

## 2011-11-13 NOTE — Telephone Encounter (Signed)
Message copied by Jill Side on Fri Nov 13, 2011  8:52 AM ------      Message from: FERRY, [place]      Created: Thu Nov 12, 2011  5:02 PM      Regarding: cancel sono       Pt does not need growth sono in 3 weeks. Order canceled. Please let pt know that after rereading Dr. Sondra Barges note, we do not need to follow growth and all of the anatomy was normal so unless something else comes up, no more sonos needed.

## 2011-11-13 NOTE — Telephone Encounter (Signed)
Called pt and informed her that additional [place] is not indicated @ this time. Pt voiced understanding and stated that she ahd been told this information by MD @ [facility]. She no additional questions.

## 2011-12-03 ENCOUNTER — Encounter: Payer: Medicaid Other | Admitting: Family Medicine

## 2011-12-10 ENCOUNTER — Ambulatory Visit (INDEPENDENT_AMBULATORY_CARE_PROVIDER_SITE_OTHER): Payer: Self-pay | Admitting: Obstetrics & Gynecology

## 2011-12-10 VITALS — BP 129/78 | Temp 97.9°F | Wt 270.7 lb

## 2011-12-10 DIAGNOSIS — O99210 Obesity complicating pregnancy, unspecified trimester: Secondary | ICD-10-CM

## 2011-12-10 DIAGNOSIS — O99330 Smoking (tobacco) complicating pregnancy, unspecified trimester: Secondary | ICD-10-CM

## 2011-12-10 DIAGNOSIS — O0990 Supervision of high risk pregnancy, unspecified, unspecified trimester: Secondary | ICD-10-CM

## 2011-12-10 LAB — CBC
HCT: 26.9 % — ABNORMAL LOW (ref 36.0–46.0)
MCH: 26.8 pg (ref 26.0–34.0)
MCV: 79.1 fL (ref 78.0–100.0)
Platelets: 378 10*3/uL (ref 150–400)
RBC: 3.4 MIL/uL — ABNORMAL LOW (ref 3.87–5.11)
RDW: 15.1 % (ref 11.5–15.5)

## 2011-12-10 LAB — POCT URINALYSIS DIP (DEVICE)
Nitrite: NEGATIVE
Urobilinogen, UA: 0.2 mg/dL (ref 0.0–1.0)
pH: 7 (ref 5.0–8.0)

## 2011-12-10 NOTE — Progress Notes (Signed)
Pulse: 90 No movement today or yesterday. 1hr gtt today and 28 week labs. Due at 1215.

## 2011-12-10 NOTE — Progress Notes (Signed)
U/S scheduled 12/11/11 at 415pm. Appointment with [facility] scheduled 12/29/11 at 115pm.

## 2011-12-10 NOTE — Progress Notes (Signed)
Pt still complaining of headaches.  Will refer to Cynthia Scott at [facility]. Pt missed [place] appt due to family emergency.  Pregnancy belt for pubic symphysis separation.  Pt can take one dose of motrin a day for headache (600 mg) until she sees headache specialist. Very difficult to measure fundus due to body habitus.

## 2011-12-11 ENCOUNTER — Ambulatory Visit (HOSPITAL_COMMUNITY): Payer: Medicaid Other | Attending: Obstetrics & Gynecology

## 2011-12-16 ENCOUNTER — Ambulatory Visit (HOSPITAL_COMMUNITY)
Admission: RE | Admit: 2011-12-16 | Discharge: 2011-12-16 | Disposition: A | Discharge status: Home / Self Care | Payer: Self-pay | Source: Ambulatory Visit | Attending: Obstetrics & Gynecology | Admitting: Obstetrics & Gynecology

## 2011-12-16 DIAGNOSIS — O0990 Supervision of high risk pregnancy, unspecified, unspecified trimester: Secondary | ICD-10-CM

## 2011-12-16 DIAGNOSIS — Z3689 Encounter for other specified antenatal screening: Secondary | ICD-10-CM | POA: Insufficient documentation

## 2011-12-16 DIAGNOSIS — E669 Obesity, unspecified: Secondary | ICD-10-CM | POA: Insufficient documentation

## 2011-12-16 DIAGNOSIS — Z86718 Personal history of other venous thrombosis and embolism: Secondary | ICD-10-CM | POA: Insufficient documentation

## 2011-12-16 DIAGNOSIS — O99210 Obesity complicating pregnancy, unspecified trimester: Secondary | ICD-10-CM | POA: Insufficient documentation

## 2011-12-17 ENCOUNTER — Encounter: Payer: Self-pay | Admitting: Obstetrics & Gynecology

## 2011-12-29 ENCOUNTER — Institutional Professional Consult (permissible substitution): Payer: Self-pay | Admitting: Nurse Practitioner

## 2011-12-29 DIAGNOSIS — R51 Headache: Secondary | ICD-10-CM

## 2011-12-31 ENCOUNTER — Ambulatory Visit (INDEPENDENT_AMBULATORY_CARE_PROVIDER_SITE_OTHER): Payer: Self-pay | Admitting: Obstetrics and Gynecology

## 2011-12-31 ENCOUNTER — Encounter: Payer: Self-pay | Admitting: Obstetrics and Gynecology

## 2011-12-31 VITALS — BP 113/66 | Temp 97.7°F | Wt 272.7 lb

## 2011-12-31 DIAGNOSIS — C8589 Other specified types of non-Hodgkin lymphoma, extranodal and solid organ sites: Secondary | ICD-10-CM

## 2011-12-31 DIAGNOSIS — C8590 Non-Hodgkin lymphoma, unspecified, unspecified site: Secondary | ICD-10-CM

## 2011-12-31 DIAGNOSIS — O0990 Supervision of high risk pregnancy, unspecified, unspecified trimester: Secondary | ICD-10-CM

## 2011-12-31 DIAGNOSIS — Z3009 Encounter for other general counseling and advice on contraception: Secondary | ICD-10-CM

## 2011-12-31 DIAGNOSIS — Z86718 Personal history of other venous thrombosis and embolism: Secondary | ICD-10-CM

## 2011-12-31 DIAGNOSIS — O361990 Maternal care for other isoimmunization, unspecified trimester, not applicable or unspecified: Secondary | ICD-10-CM

## 2011-12-31 DIAGNOSIS — O0940 Supervision of pregnancy with grand multiparity, unspecified trimester: Secondary | ICD-10-CM

## 2011-12-31 DIAGNOSIS — E669 Obesity, unspecified: Secondary | ICD-10-CM

## 2011-12-31 DIAGNOSIS — O361190 Maternal care for Anti-A sensitization, unspecified trimester, not applicable or unspecified: Secondary | ICD-10-CM

## 2011-12-31 DIAGNOSIS — O99210 Obesity complicating pregnancy, unspecified trimester: Secondary | ICD-10-CM

## 2011-12-31 LAB — POCT URINALYSIS DIP (DEVICE)
Hgb urine dipstick: NEGATIVE
Ketones, ur: NEGATIVE mg/dL
Protein, ur: NEGATIVE mg/dL
pH: 7 (ref 5.0–8.0)

## 2011-12-31 NOTE — Progress Notes (Signed)
Patient doing well. Reports boil on vagina? On physical exam, in grown follicle which developed into an abscess measuring 1.5 cm on right inferior aspect of vulva. Patient declined I&D. Advised to apply heat to the area. Also advised against shaving the area to decrease recurrence.

## 2011-12-31 NOTE — Progress Notes (Signed)
Pulse- 98  Edema- feet  Pain/pressure- lower abd Pt c/o boil on vaginal area

## 2012-01-04 ENCOUNTER — Encounter (HOSPITAL_COMMUNITY): Payer: Self-pay | Admitting: *Deleted

## 2012-01-04 ENCOUNTER — Inpatient Hospital Stay (HOSPITAL_COMMUNITY)
Admission: AD | Admit: 2012-01-04 | Discharge: 2012-01-04 | Disposition: A | Discharge status: Home / Self Care | Payer: Self-pay | Source: Ambulatory Visit | Attending: Obstetrics & Gynecology | Admitting: Obstetrics & Gynecology

## 2012-01-04 ENCOUNTER — Other Ambulatory Visit: Payer: Self-pay | Admitting: Obstetrics and Gynecology

## 2012-01-04 DIAGNOSIS — M545 Low back pain, unspecified: Secondary | ICD-10-CM | POA: Insufficient documentation

## 2012-01-04 DIAGNOSIS — M62830 Muscle spasm of back: Secondary | ICD-10-CM

## 2012-01-04 DIAGNOSIS — O99891 Other specified diseases and conditions complicating pregnancy: Secondary | ICD-10-CM | POA: Insufficient documentation

## 2012-01-04 DIAGNOSIS — R109 Unspecified abdominal pain: Secondary | ICD-10-CM | POA: Insufficient documentation

## 2012-01-04 HISTORY — DX: Hodgkin lymphoma, unspecified, unspecified site: C81.90

## 2012-01-04 LAB — URINALYSIS, ROUTINE W REFLEX MICROSCOPIC
Bilirubin Urine: NEGATIVE
Glucose, UA: NEGATIVE mg/dL
Ketones, ur: NEGATIVE mg/dL
Nitrite: NEGATIVE
Protein, ur: NEGATIVE mg/dL
Specific Gravity, Urine: 1.02 (ref 1.005–1.030)
Urobilinogen, UA: 0.2 mg/dL (ref 0.0–1.0)
pH: 6 (ref 5.0–8.0)

## 2012-01-04 LAB — URINE MICROSCOPIC-ADD ON

## 2012-01-04 MED ORDER — CYCLOBENZAPRINE HCL 10 MG PO TABS: 10.0000 mg | ORAL_TABLET | Freq: Three times a day (TID) | ORAL | Status: DC | PRN

## 2012-01-04 MED ORDER — CYCLOBENZAPRINE HCL 10 MG PO TABS
10.0000 mg | ORAL_TABLET | Freq: Once | ORAL | Status: AC
Start: 2012-01-04 — End: 2012-01-04
  Administered 2012-01-04: 10 mg via ORAL
  Filled 2012-01-04: qty 1

## 2012-01-04 NOTE — Telephone Encounter (Addendum)
Patient called and left message from [facility] triage stating a request of RF for Ambien and Percocet. Percocet originally given for unresolved increasing headaches by Dr. Thad Ranger. She was cleared to take Mothrin 600mg /day by Dr. Jearld Lesch until she sees headache specialist, although she states it does not help her as much as percocet does.  She has made appointment with Jannifer Rodney- The headache specialist in [facility] specialist on 01/26/12 @1 :15pm. Please Advice--request RF for Remus Loffler and Percocet (routed to Dr. Thad Ranger).

## 2012-01-04 NOTE — MAU Provider Note (Signed)
Attestation of Attending Supervision of Advanced Practitioner (CNM/NP): Evaluation and management procedures were performed by the Advanced Practitioner under my supervision and collaboration.  I have reviewed the Advanced Practitioner's note and chart, and I agree with the management and plan.  Lenoria Narine, MD, FACOG Attending Obstetrician & Gynecologist Faculty Practice, [facility]  

## 2012-01-04 NOTE — MAU Note (Signed)
Pt states she has been feeling lower back pain tht comes and goes in her lower back since 1500

## 2012-01-04 NOTE — MAU Provider Note (Signed)
History     CSN: [id number]  Arrival date and time: 01/04/12 2040   None     Chief Complaint  Patient presents with  . Abdominal Pain   HPI Thisi is a 31 y.o. female at [redacted]w[redacted]d who presents with c/o low to mid-back pain since 3pm today. States it hurts to walk. Is very active with caring for her young child. Denies leaking or bleeding. + FM.   OB History    Grav Para Term Preterm Abortions TAB SAB Ect Mult Living   10 6 6  3  0 3   6      Past Medical History  Diagnosis Date  . Blood transfusion 01/25/2009  . Urinary tract infection   . 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 Anemia   . Non-Hodgkin lymphoma   . 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 Cancer   . Non Hodgkin's lymphoma 2012  . DVT (deep venous thrombosis)   . Hodgkins lymphoma     pt sttes she was supposed to get treatment but showed up pregnant    Past Surgical History  Procedure Date  . Rhinoplasty   . Port-a-cath removal   . Chest wall tumor excision   . Tonsillectomy   . Wisdom tooth extraction 2010  . Appendectomy   . Cholecystectomy   . Tubes in ears   . Turmor in chest     Family History  Problem Relation Age of Onset  . Anesthesia problems Neg Hx   . Heart disease Mother   . COPD Mother   . Diabetes Mother   . Heart disease Maternal Aunt   . Diabetes Maternal Aunt   . Cancer Maternal Uncle   . Heart disease Maternal Grandmother     History  Substance Use Topics  . Smoking status: Current Some Day Smoker -- 0.5 packs/day for 13 years    Types: Cigarettes  . Smokeless tobacco: Never Used  . Alcohol Use: No    Allergies: No Known Allergies  Prescriptions prior to admission  Medication Sig Dispense Refill  . acetaminophen (TYLENOL) 500 MG tablet Take 1,000 mg by mouth every 6 (six) hours as needed. Takes for pain      . flintstones complete (FLINTSTONES) 60 MG chewable tablet Chew 3 tablets by mouth daily.      Marland Kitchen oxyCODONE-acetaminophen (PERCOCET/ROXICET) 5-325 MG per tablet Take 1  tablet by mouth every 6 (six) hours as needed for pain.  20 tablet  0  . zolpidem (AMBIEN) 5 MG tablet Take 1 tablet (5 mg total) by mouth at bedtime as needed for sleep.  30 tablet  0    ROS See HPI  Physical Exam   Blood pressure 121/31, pulse 105, temperature 97.7 F (36.5 C), temperature source Oral, resp. rate 20, height 5\' 2"  (1.575 m), weight 275 lb (124.739 kg), last menstrual period 05/15/2011, SpO2 100.00%, unknown if currently breastfeeding.  Physical Exam  Constitutional: She is oriented to person, place, and time. She appears well-developed and well-nourished. No distress.  Cardiovascular: Normal rate.   Respiratory: Effort normal.  GI: Soft. She exhibits no distension and no mass. There is no tenderness. There is no rebound and no guarding.  Genitourinary: Vagina normal and uterus normal. No vaginal discharge found.  Musculoskeletal: Normal range of motion. She exhibits no edema.       Slight tenderness to mid-back, paraspinous muscles.  Around area of L3-5.  Good ROM, able to move extremities normally and lift hips.  Neurological: She is alert and oriented to person, place, and time. She exhibits normal muscle tone.  Skin: Skin is warm and dry.  Psychiatric: She has a normal mood and affect.   FHR reactive Rare contractions  MAU Course  Procedures  MDM Will try a dose of Flexeril  Assessment and Plan  A:  SIUP at [redacted]w[redacted]d       Back pain, probably spasm  P:  Feels better, wants to go home      Discharge      Rx Flexeril  [facility] 01/04/2012, 10:08 PM

## 2012-01-05 ENCOUNTER — Telehealth: Payer: Self-pay

## 2012-01-05 NOTE — Telephone Encounter (Signed)
Pt called the front desk and wanted to know why she has not yet received any info concerning getting her ambien/percocet refilled.  I informed to the pt that Tuesdays is not a day that a provider is in the office.  Pt stated "I don't understand why is so hard to find a doctor."  I stated to the pt that our providers go to multiple offices and then they have their off days.  I advised pt that I will speak with a provider myself tomorrow and it is not the one who initally prescribed the Rx but that I would inform that she wants something for her migraines dur to ibuprofen not helping.  To please also give me til the end of the day to contact her.  Pt stated that that would be fine and had no further questions.

## 2012-01-06 ENCOUNTER — Other Ambulatory Visit: Payer: Self-pay | Admitting: Obstetrics and Gynecology

## 2012-01-06 MED ORDER — ZOLPIDEM TARTRATE 10 MG PO TABS: 10.0000 mg | ORAL_TABLET | Freq: Every evening | ORAL | Status: DC | PRN

## 2012-01-06 MED ORDER — OXYCODONE-ACETAMINOPHEN 5-325 MG PO TABS: 1.0000 | ORAL_TABLET | ORAL | Status: DC | PRN

## 2012-01-06 NOTE — Telephone Encounter (Signed)
Spoke to Dr. Macon Large and she approved RF of Percocet and Ambien. Called patient and left message to RTC to clinic in regard prescriptions. It is ready for pick up at the front desk.

## 2012-01-14 ENCOUNTER — Encounter: Payer: Medicaid Other | Admitting: Obstetrics & Gynecology

## 2012-01-17 ENCOUNTER — Encounter (HOSPITAL_COMMUNITY): Payer: Self-pay | Admitting: *Deleted

## 2012-01-17 ENCOUNTER — Inpatient Hospital Stay (HOSPITAL_COMMUNITY)
Admission: AD | Admit: 2012-01-17 | Discharge: 2012-01-17 | Disposition: A | Discharge status: Hospice | Payer: Self-pay | Source: Ambulatory Visit | Attending: Obstetrics & Gynecology | Admitting: Obstetrics & Gynecology

## 2012-01-17 DIAGNOSIS — Z86718 Personal history of other venous thrombosis and embolism: Secondary | ICD-10-CM

## 2012-01-17 DIAGNOSIS — N949 Unspecified condition associated with female genital organs and menstrual cycle: Secondary | ICD-10-CM | POA: Insufficient documentation

## 2012-01-17 DIAGNOSIS — Z3009 Encounter for other general counseling and advice on contraception: Secondary | ICD-10-CM

## 2012-01-17 DIAGNOSIS — O4700 False labor before 37 completed weeks of gestation, unspecified trimester: Secondary | ICD-10-CM | POA: Insufficient documentation

## 2012-01-17 LAB — URINALYSIS, ROUTINE W REFLEX MICROSCOPIC
Bilirubin Urine: NEGATIVE
Nitrite: NEGATIVE
Specific Gravity, Urine: 1.02 (ref 1.005–1.030)
Urobilinogen, UA: 0.2 mg/dL (ref 0.0–1.0)
pH: 6 (ref 5.0–8.0)

## 2012-01-17 LAB — URINE MICROSCOPIC-ADD ON: WBC, UA: NONE SEEN WBC/hpf (ref <3)

## 2012-01-17 MED ORDER — CYCLOBENZAPRINE HCL 10 MG PO TABS
10.0000 mg | ORAL_TABLET | Freq: Once | ORAL | Status: AC
  Administered 2012-01-17: 10 mg via ORAL
  Filled 2012-01-17: qty 1

## 2012-01-17 NOTE — MAU Note (Signed)
Patient states she has been having vaginal pain pain for about one week. States she started vomiting last night and continued today. Denies bleeding or leaking and reports good fetal movement. Having irregular contractions.

## 2012-01-21 ENCOUNTER — Ambulatory Visit (INDEPENDENT_AMBULATORY_CARE_PROVIDER_SITE_OTHER): Payer: Self-pay | Admitting: Obstetrics and Gynecology

## 2012-01-21 ENCOUNTER — Observation Stay (HOSPITAL_COMMUNITY)
Admission: AD | Admit: 2012-01-21 | Discharge: 2012-01-23 | Disposition: A | Discharge status: Home / Self Care | Payer: Medicaid Other | Source: Ambulatory Visit | Attending: Obstetrics & Gynecology | Admitting: Obstetrics & Gynecology

## 2012-01-21 ENCOUNTER — Encounter (HOSPITAL_COMMUNITY): Payer: Self-pay | Admitting: *Deleted

## 2012-01-21 ENCOUNTER — Encounter: Payer: Self-pay | Admitting: Obstetrics and Gynecology

## 2012-01-21 VITALS — BP 131/84 | Temp 97.0°F | Wt 271.5 lb

## 2012-01-21 DIAGNOSIS — C8590 Non-Hodgkin lymphoma, unspecified, unspecified site: Secondary | ICD-10-CM

## 2012-01-21 DIAGNOSIS — O361190 Maternal care for Anti-A sensitization, unspecified trimester, not applicable or unspecified: Secondary | ICD-10-CM

## 2012-01-21 DIAGNOSIS — O99891 Other specified diseases and conditions complicating pregnancy: Principal | ICD-10-CM | POA: Insufficient documentation

## 2012-01-21 DIAGNOSIS — C8589 Other specified types of non-Hodgkin lymphoma, extranodal and solid organ sites: Secondary | ICD-10-CM | POA: Insufficient documentation

## 2012-01-21 DIAGNOSIS — O212 Late vomiting of pregnancy: Secondary | ICD-10-CM | POA: Insufficient documentation

## 2012-01-21 DIAGNOSIS — O99210 Obesity complicating pregnancy, unspecified trimester: Secondary | ICD-10-CM

## 2012-01-21 DIAGNOSIS — O0940 Supervision of pregnancy with grand multiparity, unspecified trimester: Secondary | ICD-10-CM

## 2012-01-21 DIAGNOSIS — Z86718 Personal history of other venous thrombosis and embolism: Secondary | ICD-10-CM

## 2012-01-21 DIAGNOSIS — Z3009 Encounter for other general counseling and advice on contraception: Secondary | ICD-10-CM

## 2012-01-21 DIAGNOSIS — A088 Other specified intestinal infections: Secondary | ICD-10-CM | POA: Insufficient documentation

## 2012-01-21 DIAGNOSIS — N390 Urinary tract infection, site not specified: Secondary | ICD-10-CM | POA: Insufficient documentation

## 2012-01-21 DIAGNOSIS — R109 Unspecified abdominal pain: Secondary | ICD-10-CM | POA: Insufficient documentation

## 2012-01-21 DIAGNOSIS — K5289 Other specified noninfective gastroenteritis and colitis: Secondary | ICD-10-CM

## 2012-01-21 DIAGNOSIS — O99330 Smoking (tobacco) complicating pregnancy, unspecified trimester: Secondary | ICD-10-CM

## 2012-01-21 DIAGNOSIS — O361990 Maternal care for other isoimmunization, unspecified trimester, not applicable or unspecified: Secondary | ICD-10-CM

## 2012-01-21 DIAGNOSIS — O0990 Supervision of high risk pregnancy, unspecified, unspecified trimester: Secondary | ICD-10-CM

## 2012-01-21 DIAGNOSIS — K529 Noninfective gastroenteritis and colitis, unspecified: Secondary | ICD-10-CM

## 2012-01-21 DIAGNOSIS — Z8759 Personal history of other complications of pregnancy, childbirth and the puerperium: Secondary | ICD-10-CM

## 2012-01-21 DIAGNOSIS — D649 Anemia, unspecified: Secondary | ICD-10-CM

## 2012-01-21 DIAGNOSIS — O2390 Unspecified genitourinary tract infection in pregnancy, unspecified trimester: Secondary | ICD-10-CM | POA: Insufficient documentation

## 2012-01-21 HISTORY — DX: Obesity complicating pregnancy, third trimester: O99.213

## 2012-01-21 LAB — CBC WITH DIFFERENTIAL/PLATELET
Basophils Absolute: 0 10*3/uL (ref 0.0–0.1)
HCT: 27.4 % — ABNORMAL LOW (ref 36.0–46.0)
Hemoglobin: 8.8 g/dL — ABNORMAL LOW (ref 12.0–15.0)
Lymphocytes Relative: 10 % — ABNORMAL LOW (ref 12–46)
Monocytes Absolute: 0.5 10*3/uL (ref 0.1–1.0)
Monocytes Relative: 4 % (ref 3–12)
Neutro Abs: 9.6 10*3/uL — ABNORMAL HIGH (ref 1.7–7.7)
Neutrophils Relative %: 85 % — ABNORMAL HIGH (ref 43–77)
WBC: 11.2 10*3/uL — ABNORMAL HIGH (ref 4.0–10.5)

## 2012-01-21 LAB — POCT URINALYSIS DIP (DEVICE)
Nitrite: NEGATIVE
Protein, ur: NEGATIVE mg/dL
Urobilinogen, UA: 0.2 mg/dL (ref 0.0–1.0)
pH: 7 (ref 5.0–8.0)

## 2012-01-21 LAB — COMPREHENSIVE METABOLIC PANEL
AST: 21 U/L (ref 0–37)
Alkaline Phosphatase: 111 U/L (ref 39–117)
CO2: 20 mEq/L (ref 19–32)
Chloride: 101 mEq/L (ref 96–112)
Creatinine, Ser: 0.4 mg/dL — ABNORMAL LOW (ref 0.50–1.10)
GFR calc non Af Amer: >90 mL/min (ref >90)
Potassium: 3.7 mEq/L (ref 3.5–5.1)
Total Bilirubin: 0.3 mg/dL (ref 0.3–1.2)

## 2012-01-21 MED ORDER — DEXTROSE 5 % IV SOLN
1.0000 g | Freq: Two times a day (BID) | INTRAVENOUS | Status: DC
  Administered 2012-01-22 – 2012-01-23 (×3): 1 g via INTRAVENOUS
  Filled 2012-01-21 (×4): qty 10

## 2012-01-21 MED ORDER — PROMETHAZINE HCL 25 MG/ML IJ SOLN
25.0000 mg | Freq: Once | INTRAVENOUS | Status: AC
  Administered 2012-01-21: 25 mg via INTRAVENOUS
  Filled 2012-01-21: qty 1

## 2012-01-21 MED ORDER — CALCIUM CARBONATE ANTACID 500 MG PO CHEW
2.0000 | CHEWABLE_TABLET | ORAL | Status: DC | PRN
  Filled 2012-01-21: qty 2

## 2012-01-21 MED ORDER — FAMOTIDINE IN NACL 20-0.9 MG/50ML-% IV SOLN
20.0000 mg | Freq: Once | INTRAVENOUS | Status: AC
  Administered 2012-01-21: 20 mg via INTRAVENOUS
  Filled 2012-01-21: qty 50

## 2012-01-21 MED ORDER — ENOXAPARIN SODIUM 40 MG/0.4ML ~~LOC~~ SOLN
40.0000 mg | SUBCUTANEOUS | Status: DC
  Administered 2012-01-21 – 2012-01-22 (×2): 40 mg via SUBCUTANEOUS
  Filled 2012-01-21 (×2): qty 0.4

## 2012-01-21 MED ORDER — HYDROMORPHONE HCL PF 1 MG/ML IJ SOLN
1.0000 mg | INTRAMUSCULAR | Status: DC | PRN
  Administered 2012-01-21 – 2012-01-23 (×8): 1 mg via INTRAVENOUS
  Filled 2012-01-21 (×8): qty 1

## 2012-01-21 MED ORDER — ACETAMINOPHEN 325 MG PO TABS: 650.0000 mg | ORAL_TABLET | ORAL | Status: DC | PRN

## 2012-01-21 MED ORDER — DEXTROSE-NACL 5-0.45 % IV SOLN
INTRAVENOUS | Status: DC
  Administered 2012-01-21 – 2012-01-23 (×6): via INTRAVENOUS

## 2012-01-21 MED ORDER — HYDROMORPHONE HCL PF 1 MG/ML IJ SOLN
1.0000 mg | INTRAMUSCULAR | Status: DC | PRN
  Administered 2012-01-21: 1 mg via INTRAVENOUS
  Filled 2012-01-21: qty 1

## 2012-01-21 MED ORDER — DIPHENHYDRAMINE HCL 50 MG/ML IJ SOLN
25.0000 mg | Freq: Four times a day (QID) | INTRAMUSCULAR | Status: DC | PRN
  Administered 2012-01-21 – 2012-01-22 (×2): 25 mg via INTRAVENOUS
  Filled 2012-01-21 (×2): qty 1

## 2012-01-21 MED ORDER — SODIUM CHLORIDE 0.45 % IV SOLN: INTRAVENOUS | Status: DC

## 2012-01-21 MED ORDER — ONDANSETRON HCL 4 MG/2ML IJ SOLN
4.0000 mg | Freq: Four times a day (QID) | INTRAMUSCULAR | Status: DC | PRN
  Administered 2012-01-21: 4 mg via INTRAVENOUS
  Filled 2012-01-21: qty 2

## 2012-01-21 MED ORDER — METOCLOPRAMIDE HCL 5 MG/ML IJ SOLN
10.0000 mg | Freq: Four times a day (QID) | INTRAMUSCULAR | Status: DC | PRN
  Administered 2012-01-21: 10 mg via INTRAVENOUS
  Filled 2012-01-21: qty 2

## 2012-01-21 MED ORDER — SODIUM CHLORIDE 0.9 % IV SOLN
INTRAVENOUS | Status: DC
  Administered 2012-01-21: 14:00:00 via INTRAVENOUS

## 2012-01-21 MED ORDER — DOCUSATE SODIUM 100 MG PO CAPS
100.0000 mg | ORAL_CAPSULE | Freq: Every day | ORAL | Status: DC
  Filled 2012-01-21 (×2): qty 1

## 2012-01-21 MED ORDER — DEXTROSE 5 % IV SOLN: 1.0000 g | INTRAVENOUS | Status: DC

## 2012-01-21 MED ORDER — PRENATAL MULTIVITAMIN CH
1.0000 | ORAL_TABLET | Freq: Every day | ORAL | Status: DC
  Filled 2012-01-21 (×2): qty 1

## 2012-01-21 MED ORDER — ZOLPIDEM TARTRATE 5 MG PO TABS: 5.0000 mg | ORAL_TABLET | Freq: Every evening | ORAL | Status: DC | PRN

## 2012-01-21 MED ORDER — ONDANSETRON HCL 4 MG/2ML IJ SOLN
4.0000 mg | INTRAMUSCULAR | Status: DC | PRN
  Administered 2012-01-22: 4 mg via INTRAVENOUS
  Filled 2012-01-21: qty 2

## 2012-01-21 MED ORDER — DEXTROSE 5 % IV SOLN
1.0000 g | Freq: Once | INTRAVENOUS | Status: AC
  Administered 2012-01-21: 1 g via INTRAVENOUS
  Filled 2012-01-21: qty 10

## 2012-01-21 NOTE — Progress Notes (Signed)
Patient reports GI bug for the past 3 days- emesis and diarrhea not able to eat for this time period and feels very week. She is the only one sick in her household. Will send to MAU for IV hydration. No obstetrical complaints, FM/PTL precautions reviewed

## 2012-01-21 NOTE — H&P (Signed)
Cynthia Scott is a 31 y.o. female presenting for  Nausea and vomiting and abdominal pain. History is remarkable for Non-Hodgkins Lymphoma.   Finished chemo but got pregnant before she could do the Rad Rx therapy. Was due to get an Iron transfusion at [facility] (where her oncologist is) and missed appt. Has tried zofran at home with no relief.   . Maternal Medical History:  Reason for admission: Reason for admission: nausea.  Fetal activity: Perceived fetal activity is normal.   Last perceived fetal movement was within the past hour.    Prenatal Complications - Diabetes: none.    OB History    Grav Para Term Preterm Abortions TAB SAB Ect Mult Living   10 5 5  4  0 4   5     Past Medical History  Diagnosis Date  . Blood transfusion 01/25/2009  . Urinary tract infection   . 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 Anemia   . Non-Hodgkin lymphoma   . 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 Cancer   . Non Hodgkin's lymphoma 2012  . DVT (deep venous thrombosis)   . Hodgkins lymphoma     pt sttes she was supposed to get treatment but showed up pregnant   Past Surgical History  Procedure Date  . Rhinoplasty   . Port-a-cath removal   . Chest wall tumor excision   . Tonsillectomy   . Wisdom tooth extraction 2010  . Appendectomy   . Cholecystectomy   . Tubes in ears   . Turmor in chest    Family History: family history includes COPD in her mother; Cancer in her maternal uncle; Diabetes in her maternal aunt and mother; and Heart disease in her maternal aunt, maternal grandmother, and mother.  There is no history of Anesthesia problems. Social History:  reports that she has been smoking Cigarettes.  She has a 6.5 pack-year smoking history. She has never used smokeless tobacco. She reports that she does not drink alcohol or use illicit drugs.   Prenatal Transfer Tool  Maternal Diabetes: No Genetic Screening: Normal Maternal Ultrasounds/Referrals: Normal Fetal Ultrasounds or other  Referrals:  Referred to Materal Fetal Medicine  Maternal Substance Abuse:  No Significant Maternal Medications:  None Significant Maternal Lab Results:  None Other Comments:  Non-Hodgkins Lymphoma  Review of Systems  Constitutional: Positive for malaise/fatigue. Negative for fever and chills.  Gastrointestinal: Positive for nausea, vomiting and abdominal pain.  Genitourinary: Negative for dysuria.  Skin: Positive for itching.  Neurological: Positive for dizziness and weakness.      Blood pressure 130/60, pulse 99, temperature 97.7 F (36.5 C), temperature source Oral, resp. rate 22, last menstrual period 05/15/2011, not currently breastfeeding. Maternal Exam:  Uterine Assessment: Contraction frequency is rare.   Introitus: Amniotic fluid character: not assessed.     Fetal Exam Fetal Monitor Review: Mode: ultrasound.   Baseline rate: 140.  Variability: moderate (6-25 bpm).   Pattern: accelerations present and no decelerations.    Fetal State Assessment: Category I - tracings are normal.     Physical Exam  Constitutional: She is oriented to person, place, and time. She appears well-developed. No distress.  Cardiovascular: Normal rate.   Respiratory: Effort normal.  GI: Soft. She exhibits no distension and no mass. There is tenderness (mild diffuse). There is no rebound and no guarding.  Musculoskeletal: Normal range of motion.  Neurological: She is alert and oriented to person, place, and time.  Skin: Skin is warm and  dry.  Psychiatric: She has a normal mood and affect.    FHR reassuring No contractions  Prenatal labs: ABO, Rh: A/POS/-- (08/01 1126) Antibody: NEG (08/01 1126) Rubella: 71.9 (08/01 1126) RPR: NON REAC (10/31 1224)  HBsAg: NEGATIVE (08/01 1126)  HIV: NON REACTIVE (08/01 1126)  GBS:     Assessment/Plan: A:  SIUP at [redacted]w[redacted]d       Probable viral gastroenteritis, with N/V unrelieved by Zofran or Phenergan (Phenergan caused agitation)      Possible  UTI  P:  Attempted treatment with fluids and antiemetics without any improvement      Discussed with Dr Marice Potter      Will admit for overnight      IVF and rocephin      Advance clear liqs as tolerated      Anticipate discharge tomorrow    [facility] 01/21/2012, 3:11 PM

## 2012-01-21 NOTE — Plan of Care (Signed)
Problem: Consults Goal: Birthing Suites Patient Information Press F2 to bring up selections list   Pt < [redacted] weeks EGA     

## 2012-01-21 NOTE — MAU Provider Note (Signed)
History     CSN: [id number]  Arrival date and time: 01/21/12 1133   First Provider Initiated Contact with Patient 01/21/12 1223      Chief Complaint  Patient presents with  . Vomiting  . Diarrhea   HPI This is a 31 y.o. female at [redacted]w[redacted]d who presents from clinic with c/o 3 days of nausea and vomiting. Denies diarrhea or fever. Denies bleeding. Does have occasional contractions. Denies dysuria but does have diffuse low back pain. Feels weak. Has already had appendix and gallbladder removed.   OB History    Grav Para Term Preterm Abortions TAB SAB Ect Mult Living   10 5 5  4  0 4   5      Past Medical History  Diagnosis Date  . Blood transfusion 01/25/2009  . Urinary tract infection   . 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 Anemia   . Non-Hodgkin lymphoma   . 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 Cancer   . Non Hodgkin's lymphoma 2012  . DVT (deep venous thrombosis)   . Hodgkins lymphoma     pt sttes she was supposed to get treatment but showed up pregnant    Past Surgical History  Procedure Date  . Rhinoplasty   . Port-a-cath removal   . Chest wall tumor excision   . Tonsillectomy   . Wisdom tooth extraction 2010  . Appendectomy   . Cholecystectomy   . Tubes in ears   . Turmor in chest     Family History  Problem Relation Age of Onset  . Anesthesia problems Neg Hx   . Heart disease Mother   . COPD Mother   . Diabetes Mother   . Heart disease Maternal Aunt   . Diabetes Maternal Aunt   . Cancer Maternal Uncle   . Heart disease Maternal Grandmother     History  Substance Use Topics  . Smoking status: Current Some Day Smoker -- 0.5 packs/day for 13 years    Types: Cigarettes  . Smokeless tobacco: Never Used  . Alcohol Use: No    Allergies: No Known Allergies  Prescriptions prior to admission  Medication Sig Dispense Refill  . acetaminophen (TYLENOL) 500 MG tablet Take 1,000 mg by mouth every 6 (six) hours as needed. Takes for pain      . calcium  carbonate (TUMS - DOSED IN MG ELEMENTAL CALCIUM) 500 MG chewable tablet Chew 1 tablet by mouth 4 (four) times daily as needed. For heartburn      . cyclobenzaprine (FLEXERIL) 10 MG tablet Take 1 tablet (10 mg total) by mouth 3 (three) times daily as needed for muscle spasms.  30 tablet  0  . flintstones complete (FLINTSTONES) 60 MG chewable tablet Chew 3 tablets by mouth daily.      Marland Kitchen oxyCODONE-acetaminophen (PERCOCET/ROXICET) 5-325 MG per tablet Take 1 tablet by mouth every 4 (four) hours as needed for pain.  20 tablet  0  . zolpidem (AMBIEN) 10 MG tablet Take 1 tablet (10 mg total) by mouth at bedtime as needed for sleep.  30 tablet  3    ROS See HPI  Physical Exam   Blood pressure 130/60, pulse 99, temperature 97.7 F (36.5 C), temperature source Oral, resp. rate 22, last menstrual period 05/15/2011, not currently breastfeeding.  Physical Exam  Constitutional: She is oriented to person, place, and time. She appears well-developed and well-nourished. No distress.  Cardiovascular: Normal rate.   Respiratory: Effort normal.  GI: Soft. She  exhibits no distension and no mass. There is no tenderness. There is no rebound and no guarding.  Musculoskeletal: Normal range of motion.  Neurological: She is alert and oriented to person, place, and time.  Skin: Skin is warm and dry.  Psychiatric: She has a normal mood and affect.   Results for orders placed in visit on 01/21/12 (from the past 24 hour(s))  POCT URINALYSIS DIP (DEVICE)     Status: Abnormal   Collection Time   01/21/12 11:01 AM      Component Value Range   Glucose, UA NEGATIVE  NEGATIVE mg/dL   Bilirubin Urine NEGATIVE  NEGATIVE   Ketones, ur NEGATIVE  NEGATIVE mg/dL   Specific Gravity, Urine 1.015  1.005 - 1.030   Hgb urine dipstick MODERATE (*) NEGATIVE   pH 7.0  5.0 - 8.0   Protein, ur NEGATIVE  NEGATIVE mg/dL   Urobilinogen, UA 0.2  0.0 - 1.0 mg/dL   Nitrite NEGATIVE  NEGATIVE   Leukocytes, UA SMALL (*) NEGATIVE    MAU  Course  Procedures  MDM See admission note. Gave pt IVF and meds with no relief.   Assessment and Plan  A:  SIUP at [redacted]w[redacted]d       Probable gastroenteritis      Possible UTI P:  Admit      See H&P  [facility] 01/21/2012, 12:43 PM

## 2012-01-21 NOTE — MAU Note (Signed)
Pt states was seen a few days ago in MAU for same s/s. Has had n/v/d. Came up from clinic downstairs. Cervix 1cm today, havign irregular contractions.

## 2012-01-22 DIAGNOSIS — O9989 Other specified diseases and conditions complicating pregnancy, childbirth and the puerperium: Secondary | ICD-10-CM

## 2012-01-22 DIAGNOSIS — O212 Late vomiting of pregnancy: Secondary | ICD-10-CM

## 2012-01-22 DIAGNOSIS — K5289 Other specified noninfective gastroenteritis and colitis: Secondary | ICD-10-CM

## 2012-01-22 DIAGNOSIS — A088 Other specified intestinal infections: Secondary | ICD-10-CM

## 2012-01-22 MED ORDER — FAMOTIDINE 20 MG PO TABS
20.0000 mg | ORAL_TABLET | Freq: Two times a day (BID) | ORAL | Status: DC
  Administered 2012-01-22 (×3): 20 mg via ORAL
  Filled 2012-01-22 (×3): qty 1

## 2012-01-22 NOTE — Progress Notes (Signed)
UR completed 

## 2012-01-22 NOTE — Progress Notes (Signed)
HD #2  S. She says that she feels no better, still throwing up any time that she drinks liquids. She tells me that she was in her usual state of good health until about 4 days ago when she began with the n/v. No one else in the house is sick. She reports good FM, denies VB, ROM, or CTX.  O. VSS, AF     FHR reassuring     Heart- rrr     Lungs- CTAB     Abd- gravid, benign  A/P. N/V at 33.[redacted] weeks EGA with no obvious cause, ? Viral etiology    Non-Hodgkin's lymphoma who was scheduled to get an iron transfusion. She did not keep that appt and hasn't rescheduled it. I will get her to sign ROI so that we can possibly give her the iron here. I spoke with the pharmacist regarding this plan.

## 2012-01-23 ENCOUNTER — Encounter (HOSPITAL_COMMUNITY): Payer: Self-pay | Admitting: Obstetrics and Gynecology

## 2012-01-23 DIAGNOSIS — D649 Anemia, unspecified: Secondary | ICD-10-CM

## 2012-01-23 DIAGNOSIS — Z86718 Personal history of other venous thrombosis and embolism: Secondary | ICD-10-CM

## 2012-01-23 LAB — URINE CULTURE

## 2012-01-23 MED ORDER — PANTOPRAZOLE SODIUM 40 MG PO TBEC
40.0000 mg | DELAYED_RELEASE_TABLET | Freq: Every day | ORAL | Status: DC
  Administered 2012-01-23: 40 mg via ORAL
  Filled 2012-01-23: qty 1

## 2012-01-23 MED ORDER — ENOXAPARIN SODIUM 40 MG/0.4ML ~~LOC~~ SOLN: 40.0000 mg | SUBCUTANEOUS | Status: DC

## 2012-01-23 MED ORDER — FERUMOXYTOL INJECTION 510 MG/17 ML
510.0000 mg | Freq: Once | INTRAVENOUS | Status: AC
  Administered 2012-01-23: 510 mg via INTRAVENOUS
  Filled 2012-01-23: qty 17

## 2012-01-23 NOTE — Discharge Summary (Addendum)
Physician Discharge Summary  Patient ID: Cynthia Scott MRN: [id number] DOB/AGE: 1981-02-02 31 y.o.  Admit date: 01/21/2012 Discharge date: 01/23/2012  Admission Diagnoses: gastroenteritis   Discharge Diagnoses: same Active Problems:  * No active hospital problems. *    Discharged Condition: good  Hospital Course: Patient admitted for IV hydration following 3-days of nausea, emesis and some diarrhea. While hospitalized, she remained afebrile, her nausea and emesis resolved and she did not have any diarrhea. All her labs were found to be normal, with the exception of a UA. She received a dose of Rocephin. Patient also has a history of non-hodgkin lymphoma and plan was for her to receive and iron transfusion in first trimester. Patient failed to comply. She receives iron IV prior to discharge. In addition, patient reported on the day of discharge that she has a history of LLE or LUE DVT and was treated with lovenox while incarcerated in [place], [place]. Patient did not continue anticoagulation therapy with Coumadin, upon her release. Lovenox thereapy was initiated during her hospital stay and will continue into the postpartum period.  Consults: None  Significant Diagnostic Studies:   Treatments: IV hydration and anticoagulation: LMW heparin  Discharge Exam: Blood pressure 78/51, pulse 95, temperature 98 F (36.7 C), temperature source Oral, resp. rate 20, height 5\' 2"  (1.575 m), weight 275 lb (124.739 kg), last menstrual period 05/15/2011, SpO2 100.00%, not currently breastfeeding. General appearance: alert, cooperative and no distress Resp: clear to auscultation bilaterally Cardio: S1, S2 normal GI: soft, gravid, NT, obese Extremities: extremities normal, atraumatic, no cyanosis or edema and Homans sign is negative, no sign of DVT  Fetal status remained category I throughout her stay  Disposition: 50-Hospice/Home  Discharge Orders    Future Appointments: Provider: Department:  Dept Phone: Center:   01/26/2012 1:15 PM Carolynn Serve, NP [facility] [phone] [email or web address]   02/04/2012 10:45 AM Adam Phenix, MD [facility] [phone] [facility]       Medication List     As of 01/23/2012 11:03 AM    TAKE these medications         acetaminophen 500 MG tablet   Commonly known as: TYLENOL   Take 1,000 mg by mouth every 6 (six) hours as needed. Takes for pain      calcium carbonate 500 MG chewable tablet   Commonly known as: TUMS - dosed in mg elemental calcium   Chew 1 tablet by mouth 4 (four) times daily as needed. For heartburn      cyclobenzaprine 10 MG tablet   Commonly known as: FLEXERIL   Take 1 tablet (10 mg total) by mouth 3 (three) times daily as needed for muscle spasms.      enoxaparin 40 MG/0.4ML injection   Commonly known as: LOVENOX   Inject 0.4 mLs (40 mg total) into the skin daily.      flintstones complete 60 MG chewable tablet   Chew 3 tablets by mouth daily.      oxyCODONE-acetaminophen 5-325 MG per tablet   Commonly known as: PERCOCET/ROXICET   Take 1 tablet by mouth every 4 (four) hours as needed for pain.      zolpidem 10 MG tablet   Commonly known as: AMBIEN   Take 1 tablet (10 mg total) by mouth at bedtime as needed for sleep.           Follow-up Information    Please follow up. (As scheduled on 12/26)  Signed: Delilah Mulgrew 01/23/2012, 11:03 AM

## 2012-01-23 NOTE — Clinical Social Work Note (Signed)
CSW spoke with pt about medicaid.  CSW referred pt to her caseworker at [employer] for further assistance.  Please reconsult CSW if further needs arise.   [phone]

## 2012-01-23 NOTE — Progress Notes (Signed)
FACULTY PRACTICE ANTEPARTUM(COMPREHENSIVE) NOTE  Cynthia Scott is a 31 y.o. [id number] at [redacted]w[redacted]d  is admitted for postprandial nausea and vomiting with most meals, especially liquids, x 4-5 days.  She denies abd pain, no family illnesses.   Fetal presentation is cephalic. 2 additional questions arise:   1. Whether to continue DVT prophylaxis as outpatient. Patient experienced a DVT in 2010 while being incarcerated [place], and while being Tx'd for non-hodgkins lymphoma.. She reports she did not continue on Coumadin after release due to not having a clinic to monitor her anticoagulant .  2. She was at one time being considered for IV iron tx but did NOT keep scheduled appt.  Will arrange for admin of IV slow push of Ferraheme with 30 mins monitoring prior to discharge. Length of Stay:  2  Days  Subjective: No fever, chills abd pain or back pain Patient reports the fetal movement as active. Patient reports uterine contraction  activity as none. Patient reports  vaginal bleeding as none. Patient describes fluid per vagina as None.  Vitals:  Blood pressure 133/58, pulse 98, temperature 98.6 F (37 C), temperature source Oral, resp. rate 18, height 5\' 2"  (1.575 m), weight 275 lb (124.739 kg), last menstrual period 05/15/2011, SpO2 100.00%, not currently breastfeeding. Physical Examination:  General appearance - alert, well appearing, and in no distress Heart - normal rate and regular rhythm Abdomen - soft, nontender, nondistended Fundal Height:  size equals dates Extremities: extremities normal, atraumatic, no cyanosis or edema and Homans sign is negative, no sign of DVT with DTRs 2+ bilaterally Membranes:intact  Fetal Monitoring:  Reactive yesterday.  Labs:  CBC    Component Value Date/Time   WBC 11.2* 01/21/2012 1349   RBC 3.48* 01/21/2012 1349   HGB 8.8* 01/21/2012 1349   HCT 27.4* 01/21/2012 1349   PLT 301 01/21/2012 1349   MCV 78.7 01/21/2012 1349   MCH 25.3* 01/21/2012  1349   MCHC 32.1 01/21/2012 1349   RDW 15.1 01/21/2012 1349   LYMPHSABS 1.1 01/21/2012 1349   MONOABS 0.5 01/21/2012 1349   EOSABS 0.1 01/21/2012 1349   BASOSABS 0.0 01/21/2012 1349   Urine Culture : NO growth   Imaging Studies:    Marland Kitchen  Medications:  Scheduled    . cefTRIAXone (ROCEPHIN)  IV  1 g Intravenous Q12H  . docusate sodium  100 mg Oral Daily  . enoxaparin (LOVENOX) injection  40 mg Subcutaneous Q24H  . famotidine  20 mg Oral BID  . pantoprazole  40 mg Oral Daily  . prenatal multivitamin  1 tablet Oral Daily   I have reviewed the patient's current medications.  ASSESSMENT: Patient Active Problem List  Diagnosis  . Unspecified high-risk pregnancy  . Grand multiparity with current pregnancy, antepartum  . Non-Hodgkin lymphoma  . Tobacco use complicating pregnancy  . Maternal atypical antibody complicating pregnancy-Anti Kell  . Obesity complicating peripregnancy, antepartum  . Anemia  . Maternal DVT (deep vein thrombosis), history of  . Undesired fertility    PLAN: Ferraheme 510 mg slow infusion this a.m. D/c later today Consider prophylaxis lovenox 40 henceforth.  Yuri Flener V 01/23/2012,7:29 AM

## 2012-01-25 LAB — TYPE AND SCREEN
ABO/RH(D): A POS
DAT, IgG: NEGATIVE
PT AG Type: NEGATIVE
Unit division: 0
Unit division: 0

## 2012-01-26 ENCOUNTER — Institutional Professional Consult (permissible substitution): Payer: Medicaid Other | Admitting: Nurse Practitioner

## 2012-01-27 NOTE — Telephone Encounter (Signed)
Opened in error

## 2012-02-04 ENCOUNTER — Ambulatory Visit (INDEPENDENT_AMBULATORY_CARE_PROVIDER_SITE_OTHER): Payer: Medicaid Other | Admitting: Obstetrics & Gynecology

## 2012-02-04 VITALS — BP 136/89 | Temp 97.5°F | Wt 269.8 lb

## 2012-02-04 DIAGNOSIS — Z8759 Personal history of other complications of pregnancy, childbirth and the puerperium: Secondary | ICD-10-CM

## 2012-02-04 DIAGNOSIS — O0990 Supervision of high risk pregnancy, unspecified, unspecified trimester: Secondary | ICD-10-CM

## 2012-02-04 DIAGNOSIS — Z86718 Personal history of other venous thrombosis and embolism: Secondary | ICD-10-CM

## 2012-02-04 LAB — POCT URINALYSIS DIP (DEVICE)
Glucose, UA: NEGATIVE mg/dL
Ketones, ur: NEGATIVE mg/dL
Specific Gravity, Urine: 1.02 (ref 1.005–1.030)
Urobilinogen, UA: 0.2 mg/dL (ref 0.0–1.0)

## 2012-02-04 MED ORDER — PANTOPRAZOLE SODIUM 40 MG PO TBEC: 40.0000 mg | DELAYED_RELEASE_TABLET | Freq: Every day | ORAL | Status: DC

## 2012-02-04 MED ORDER — OXYCODONE-ACETAMINOPHEN 5-325 MG PO TABS: 1.0000 | ORAL_TABLET | ORAL | Status: DC | PRN

## 2012-02-04 NOTE — Patient Instructions (Signed)
Migraine Headache A migraine headache is an intense, throbbing pain on one or both sides of your head. A migraine can last for 30 minutes to several hours. CAUSES  The exact cause of a migraine headache is not always known. However, a migraine may be caused when nerves in the brain become irritated and release chemicals that cause inflammation. This causes pain. SYMPTOMS  Pain on one or both sides of your head.  Pulsating or throbbing pain.  Severe pain that prevents daily activities.  Pain that is aggravated by any physical activity.  Nausea, vomiting, or both.  Dizziness.  Pain with exposure to bright lights, loud noises, or activity.  General sensitivity to bright lights, loud noises, or smells. Before you get a migraine, you may get warning signs that a migraine is coming (aura). An aura may include:  Seeing flashing lights.  Seeing bright spots, halos, or zig-zag lines.  Having tunnel vision or blurred vision.  Having feelings of numbness or tingling.  Having trouble talking.  Having muscle weakness. MIGRAINE TRIGGERS  Alcohol.  Smoking.  Stress.  Menstruation.  Aged cheeses.  Foods or drinks that contain nitrates, glutamate, aspartame, or tyramine.  Lack of sleep.  Chocolate.  Caffeine.  Hunger.  Physical exertion.  Fatigue.  Medicines used to treat chest pain (nitroglycerine), birth control pills, estrogen, and some blood pressure medicines. DIAGNOSIS  A migraine headache is often diagnosed based on:  Symptoms.  Physical examination.  A CT scan or MRI of your head. TREATMENT Medicines may be given for pain and nausea. Medicines can also be given to help prevent recurrent migraines.  HOME CARE INSTRUCTIONS  Only take over-the-counter or prescription medicines for pain or discomfort as directed by your caregiver. The use of long-term narcotics is not recommended.  Lie down in a dark, quiet room when you have a migraine.  Keep a journal  to find out what may trigger your migraine headaches. For example, write down:  What you eat and drink.  How much sleep you get.  Any change to your diet or medicines.  Limit alcohol consumption.  Quit smoking if you smoke.  Get 7 to 9 hours of sleep, or as recommended by your caregiver.  Limit stress.  Keep lights dim if bright lights bother you and make your migraines worse. SEEK IMMEDIATE MEDICAL CARE IF:   Your migraine becomes severe.  You have a fever.  You have a stiff neck.  You have vision loss.  You have muscular weakness or loss of muscle control.  You start losing your balance or have trouble walking.  You feel faint or pass out.  You have severe symptoms that are different from your first symptoms. MAKE SURE YOU:   Understand these instructions.  Will watch your condition.  Will get help right away if you are not doing well or get worse. Document Released: 01/26/2005 Document Revised: 04/20/2011 Document Reviewed: 01/16/2011 [employer] Patient Information 2013 [employer].  

## 2012-02-04 NOTE — Progress Notes (Signed)
Sore throat 2 days, no fever. Throat sl red no exudate, chest clear. Occasional headache, asks for refill of Percocet. Lg leukocytes, will order culture.Protonix for reflux

## 2012-02-04 NOTE — Progress Notes (Signed)
Patient reports that fetal movement has decreased. Patient complains of very sore throat and a bad cough.

## 2012-02-06 LAB — CULTURE, URINE COMPREHENSIVE: Colony Count: 80000

## 2012-02-10 NOTE — L&D Delivery Note (Signed)
Delivery Note Pt finally got into a good labor pattern, had SROM with clear fluid at 0445.  After a brief second stage, at 6:11 AM a viable female was delivered via Vaginal, Spontaneous Delivery (Presentation:LOA ;  ).  APGAR:9/9 , ; weight .  pending Placenta status: Intact, Spontaneous. 3V Cord:  with the following complications: none.   Anesthesia:  none Episiotomy: none Lacerations: 1 degree labial Suture Repair: n/a Est. Blood Loss (mL): 200  Mom to postpartum.  Baby to nursery-stable.  Delivery by Dr. Bonnita Nasuti with myself in attendance  CRESENZO-DISHMAN,Anureet Bruington 03/04/2012, 6:21 AM

## 2012-02-11 ENCOUNTER — Ambulatory Visit (INDEPENDENT_AMBULATORY_CARE_PROVIDER_SITE_OTHER): Payer: Self-pay | Admitting: Obstetrics & Gynecology

## 2012-02-11 VITALS — BP 137/83 | Wt 265.5 lb

## 2012-02-11 DIAGNOSIS — Z8759 Personal history of other complications of pregnancy, childbirth and the puerperium: Secondary | ICD-10-CM

## 2012-02-11 DIAGNOSIS — B952 Enterococcus as the cause of diseases classified elsewhere: Secondary | ICD-10-CM

## 2012-02-11 DIAGNOSIS — N390 Urinary tract infection, site not specified: Secondary | ICD-10-CM

## 2012-02-11 DIAGNOSIS — O99210 Obesity complicating pregnancy, unspecified trimester: Secondary | ICD-10-CM

## 2012-02-11 DIAGNOSIS — Z86718 Personal history of other venous thrombosis and embolism: Secondary | ICD-10-CM

## 2012-02-11 DIAGNOSIS — O0990 Supervision of high risk pregnancy, unspecified, unspecified trimester: Secondary | ICD-10-CM

## 2012-02-11 DIAGNOSIS — B373 Candidiasis of vulva and vagina: Secondary | ICD-10-CM

## 2012-02-11 DIAGNOSIS — E669 Obesity, unspecified: Secondary | ICD-10-CM

## 2012-02-11 DIAGNOSIS — O0940 Supervision of pregnancy with grand multiparity, unspecified trimester: Secondary | ICD-10-CM

## 2012-02-11 DIAGNOSIS — O368190 Decreased fetal movements, unspecified trimester, not applicable or unspecified: Secondary | ICD-10-CM

## 2012-02-11 LAB — OB RESULTS CONSOLE GC/CHLAMYDIA: Gonorrhea: NEGATIVE

## 2012-02-11 LAB — OB RESULTS CONSOLE GBS: GBS: NEGATIVE

## 2012-02-11 LAB — POCT URINALYSIS DIP (DEVICE)
Bilirubin Urine: NEGATIVE
Ketones, ur: NEGATIVE mg/dL
Nitrite: NEGATIVE
Protein, ur: NEGATIVE mg/dL
pH: 7 (ref 5.0–8.0)

## 2012-02-11 MED ORDER — FLUCONAZOLE 150 MG PO TABS: ORAL_TABLET | ORAL | Status: DC

## 2012-02-11 MED ORDER — AMOXICILLIN 500 MG PO CAPS: 500.0000 mg | ORAL_CAPSULE | Freq: Three times a day (TID) | ORAL | Status: DC

## 2012-02-11 NOTE — Patient Instructions (Signed)
Return to clinic for any obstetric concerns or go to [facility] for evaluation  

## 2012-02-11 NOTE — Progress Notes (Signed)
P=106, States hasn't felt baby move for 2 days, c/o pain in epigastric area and pelvic area. C/o lots of mucous discharge.Declines flu shot . Desires tdap

## 2012-02-11 NOTE — Progress Notes (Signed)
FHR in 150s, found in left upper fundus. NST done for decreased fetal movement, was reactive.  Patient reported vulvar irritation not alleviated by topical antifungals; she was prescribed Diflucan. Pelvic cultures and wet prep done today. Patient also had a urine culture on 12/26 with 50K of enterococcus, Amoxicillin prescribed. Ultrasound showed transverse, head to maternal left, spine up presentation.  Patient reports that her last baby had variable presentation, she underwent version and was induced at 38 weeks.  She was told that she will be re-evaluated next week, if still transverse, will offer version.  Patient was strongly advised to come in for evaluation for decreased fetal movement, labor precautions also reviewed.

## 2012-02-14 LAB — CULTURE, BETA STREP (GROUP B ONLY)

## 2012-02-15 ENCOUNTER — Encounter: Payer: Self-pay | Admitting: *Deleted

## 2012-02-15 ENCOUNTER — Encounter: Payer: Self-pay | Admitting: Obstetrics & Gynecology

## 2012-02-18 ENCOUNTER — Ambulatory Visit (INDEPENDENT_AMBULATORY_CARE_PROVIDER_SITE_OTHER): Payer: Self-pay | Admitting: Family Medicine

## 2012-02-18 VITALS — BP 129/79 | Temp 96.9°F | Wt 271.7 lb

## 2012-02-18 DIAGNOSIS — O368190 Decreased fetal movements, unspecified trimester, not applicable or unspecified: Secondary | ICD-10-CM

## 2012-02-18 DIAGNOSIS — Z86718 Personal history of other venous thrombosis and embolism: Secondary | ICD-10-CM

## 2012-02-18 LAB — POCT URINALYSIS DIP (DEVICE)
Bilirubin Urine: NEGATIVE
Nitrite: NEGATIVE
Protein, ur: NEGATIVE mg/dL
pH: 7 (ref 5.0–8.0)

## 2012-02-18 NOTE — Progress Notes (Signed)
Reports decreased fetal movement again today--will check NST-appears vertex

## 2012-02-18 NOTE — Patient Instructions (Signed)
Pregnancy - Third Trimester The third trimester of pregnancy (the last 3 months) is a period of the most rapid growth for you and your baby. The baby approaches a length of 20 inches and a weight of 6 to 10 pounds. The baby is adding on fat and getting ready for life outside your body. While inside, babies have periods of sleeping and waking, suck their thumbs, and hiccups. You can often feel small contractions of the uterus. This is false labor. It is also called Braxton-Hicks contractions. This is like a practice for labor. The usual problems in this stage of pregnancy include more difficulty breathing, swelling of the hands and feet from water retention, and having to urinate more often because of the uterus and baby pressing on your bladder.  PRENATAL EXAMS  Blood work may continue to be done during prenatal exams. These tests are done to check on your health and the probable health of your baby. Blood work is used to follow your blood levels (hemoglobin). Anemia (low hemoglobin) is common during pregnancy. Iron and vitamins are given to help prevent this. You may also continue to be checked for diabetes. Some of the past blood tests may be done again.  The size of the uterus is measured during each visit. This makes sure your baby is growing properly according to your pregnancy dates.  Your blood pressure is checked every prenatal visit. This is to make sure you are not getting toxemia.  Your urine is checked every prenatal visit for infection, diabetes and protein.  Your weight is checked at each visit. This is done to make sure gains are happening at the suggested rate and that you and your baby are growing normally.  Sometimes, an ultrasound is performed to confirm the position and the proper growth and development of the baby. This is a test done that bounces harmless sound waves off the baby so your caregiver can more accurately determine due dates.  Discuss the type of pain medication  and anesthesia you will have during your labor and delivery.  Discuss the possibility and anesthesia if a Cesarean Section might be necessary.  Inform your caregiver if there is any mental or physical violence at home. Sometimes, a specialized non-stress test, contraction stress test and biophysical profile are done to make sure the baby is not having a problem. Checking the amniotic fluid surrounding the baby is called an amniocentesis. The amniotic fluid is removed by sticking a needle into the belly (abdomen). This is sometimes done near the end of pregnancy if an early delivery is required. In this case, it is done to help make sure the baby's lungs are mature enough for the baby to live outside of the womb. If the lungs are not mature and it is unsafe to deliver the baby, an injection of cortisone medication is given to the mother 1 to 2 days before the delivery. This helps the baby's lungs mature and makes it safer to deliver the baby. CHANGES OCCURING IN THE THIRD TRIMESTER OF PREGNANCY Your body goes through many changes during pregnancy. They vary from person to person. Talk to your caregiver about changes you notice and are concerned about.  During the last trimester, you have probably had an increase in your appetite. It is normal to have cravings for certain foods. This varies from person to person and pregnancy to pregnancy.  You may begin to get stretch marks on your hips, abdomen, and breasts. These are normal changes in the body   during pregnancy. There are no exercises or medications to take which prevent this change.  Constipation may be treated with a stool softener or adding bulk to your diet. Drinking lots of fluids, fiber in vegetables, fruits, and whole grains are helpful.  Exercising is also helpful. If you have been very active up until your pregnancy, most of these activities can be continued during your pregnancy. If you have been less active, it is helpful to start an  exercise program such as walking. Consult your caregiver before starting exercise programs.  Avoid all smoking, alcohol, un-prescribed drugs, herbs and "street drugs" during your pregnancy. These chemicals affect the formation and growth of the baby. Avoid chemicals throughout the pregnancy to ensure the delivery of a healthy infant.  Backache, varicose veins and hemorrhoids may develop or get worse.  You will tire more easily in the third trimester, which is normal.  The baby's movements may be stronger and more often.  You may become short of breath easily.  Your belly button may stick out.  A yellow discharge may leak from your breasts called colostrum.  You may have a bloody mucus discharge. This usually occurs a few days to a week before labor begins. HOME CARE INSTRUCTIONS   Keep your caregiver's appointments. Follow your caregiver's instructions regarding medication use, exercise, and diet.  During pregnancy, you are providing food for you and your baby. Continue to eat regular, well-balanced meals. Choose foods such as meat, fish, milk and other low fat dairy products, vegetables, fruits, and whole-grain breads and cereals. Your caregiver will tell you of the ideal weight gain.  A physical sexual relationship may be continued throughout pregnancy if there are no other problems such as early (premature) leaking of amniotic fluid from the membranes, vaginal bleeding, or belly (abdominal) pain.  Exercise regularly if there are no restrictions. Check with your caregiver if you are unsure of the safety of your exercises. Greater weight gain will occur in the last 2 trimesters of pregnancy. Exercising helps:  Control your weight.  Get you in shape for labor and delivery.  You lose weight after you deliver.  Rest a lot with legs elevated, or as needed for leg cramps or low back pain.  Wear a good support or jogging bra for breast tenderness during pregnancy. This may help if worn  during sleep. Pads or tissues may be used in the bra if you are leaking colostrum.  Do not use hot tubs, steam rooms, or saunas.  Wear your seat belt when driving. This protects you and your baby if you are in an accident.  Avoid raw meat, cat litter boxes and soil used by cats. These carry germs that can cause birth defects in the baby.  It is easier to loose urine during pregnancy. Tightening up and strengthening the pelvic muscles will help with this problem. You can practice stopping your urination while you are going to the bathroom. These are the same muscles you need to strengthen. It is also the muscles you would use if you were trying to stop from passing gas. You can practice tightening these muscles up 10 times a set and repeating this about 3 times per day. Once you know what muscles to tighten up, do not perform these exercises during urination. It is more likely to cause an infection by backing up the urine.  Ask for help if you have financial, counseling or nutritional needs during pregnancy. Your caregiver will be able to offer counseling for these   needs as well as refer you for other special needs.  Make a list of emergency phone numbers and have them available.  Plan on getting help from family or friends when you go home from the hospital.  Make a trial run to the hospital.  Take prenatal classes with the father to understand, practice and ask questions about the labor and delivery.  Prepare the baby's room/nursery.  Do not travel out of the city unless it is absolutely necessary and with the advice of your caregiver.  Wear only low or no heal shoes to have better balance and prevent falling. MEDICATIONS AND DRUG USE IN PREGNANCY  Take prenatal vitamins as directed. The vitamin should contain 1 milligram of folic acid. Keep all vitamins out of reach of children. Only a couple vitamins or tablets containing iron may be fatal to a baby or young child when  ingested.  Avoid use of all medications, including herbs, over-the-counter medications, not prescribed or suggested by your caregiver. Only take over-the-counter or prescription medicines for pain, discomfort, or fever as directed by your caregiver. Do not use aspirin, ibuprofen (Motrin, Advil, Nuprin) or naproxen (Aleve) unless OK'd by your caregiver.  Let your caregiver also know about herbs you may be using.  Alcohol is related to a number of birth defects. This includes fetal alcohol syndrome. All alcohol, in any form, should be avoided completely. Smoking will cause low birth rate and premature babies.  Street/illegal drugs are very harmful to the baby. They are absolutely forbidden. A baby born to an addicted mother will be addicted at birth. The baby will go through the same withdrawal an adult does. SEEK MEDICAL CARE IF: You have any concerns or worries during your pregnancy. It is better to call with your questions if you feel they cannot wait, rather than worry about them. DECISIONS ABOUT CIRCUMCISION You may or may not know the sex of your baby. If you know your baby is a boy, it may be time to think about circumcision. Circumcision is the removal of the foreskin of the penis. This is the skin that covers the sensitive end of the penis. There is no proven medical need for this. Often this decision is made on what is popular at the time or based upon religious beliefs and social issues. You can discuss these issues with your caregiver or pediatrician. SEEK IMMEDIATE MEDICAL CARE IF:   An unexplained oral temperature above 102 F (38.9 C) develops, or as your caregiver suggests.  You have leaking of fluid from the vagina (birth canal). If leaking membranes are suspected, take your temperature and tell your caregiver of this when you call.  There is vaginal spotting, bleeding or passing clots. Tell your caregiver of the amount and how many pads are used.  You develop a bad smelling  vaginal discharge with a change in the color from clear to white.  You develop vomiting that lasts more than 24 hours.  You develop chills or fever.  You develop shortness of breath.  You develop burning on urination.  You loose more than 2 pounds of weight or gain more than 2 pounds of weight or as suggested by your caregiver.  You notice sudden swelling of your face, hands, and feet or legs.  You develop belly (abdominal) pain. Round ligament discomfort is a common non-cancerous (benign) cause of abdominal pain in pregnancy. Your caregiver still must evaluate you.  You develop a severe headache that does not go away.  You develop visual   problems, blurred or double vision.  If you have not felt your baby move for more than 1 hour. If you think the baby is not moving as much as usual, eat something with sugar in it and lie down on your left side for an hour. The baby should move at least 4 to 5 times per hour. Call right away if your baby moves less than that.  You fall, are in a car accident or any kind of trauma.  There is mental or physical violence at home. Document Released: 01/20/2001 Document Revised: 04/20/2011 Document Reviewed: 07/25/2008 [employer] Patient Information 2013 [employer].  Breastfeeding Deciding to breastfeed is one of the best choices you can make for you and your baby. The information that follows gives a brief overview of the benefits of breastfeeding as well as common topics surrounding breastfeeding. BENEFITS OF BREASTFEEDING For the baby  The first milk (colostrum) helps the baby's digestive system function better.   There are antibodies in the mother's milk that help the baby fight off infections.   The baby has a lower incidence of asthma, allergies, and sudden infant death syndrome (SIDS).   The nutrients in breast milk are better for the baby than infant formulas, and breast milk helps the baby's brain grow better.   Babies who  breastfeed have less gas, colic, and constipation.  For the mother  Breastfeeding helps develop a very special bond between the mother and her baby.   Breastfeeding is convenient, always available at the correct temperature, and costs nothing.   Breastfeeding burns calories in the mother and helps her lose weight that was gained during pregnancy.   Breastfeeding makes the uterus contract back down to normal size faster and slows bleeding following delivery.   Breastfeeding mothers have a lower risk of developing breast cancer.  BREASTFEEDING FREQUENCY  A healthy, full-term baby may breastfeed as often as every hour or space his or her feedings to every 3 hours.   Watch your baby for signs of hunger. Nurse your baby if he or she shows signs of hunger. How often you nurse will vary from baby to baby.   Nurse as often as the baby requests, or when you feel the need to reduce the fullness of your breasts.   Awaken the baby if it has been 3 4 hours since the last feeding.   Frequent feeding will help the mother make more milk and will help prevent problems, such as sore nipples and engorgement of the breasts.  BABY'S POSITION AT THE BREAST  Whether lying down or sitting, be sure that the baby's tummy is facing your tummy.   Support the breast with 4 fingers underneath the breast and the thumb above. Make sure your fingers are well away from the nipple and baby's mouth.   Stroke the baby's lips gently with your finger or nipple.   When the baby's mouth is open wide enough, place all of your nipple and as much of the areola as possible into your baby's mouth.   Pull the baby in close so the tip of the nose and the baby's cheeks touch the breast during the feeding.  FEEDINGS AND SUCTION  The length of each feeding varies from baby to baby and from feeding to feeding.   The baby must suck about 2 3 minutes for your milk to get to him or her. This is called a "let down."  For this reason, allow the baby to feed on each breast as   long as he or she wants. Your baby will end the feeding when he or she has received the right balance of nutrients.   To break the suction, put your finger into the corner of the baby's mouth and slide it between his or her gums before removing your breast from his or her mouth. This will help prevent sore nipples.  HOW TO TELL WHETHER YOUR BABY IS GETTING ENOUGH BREAST MILK. Wondering whether or not your baby is getting enough milk is a common concern among mothers. You can be assured that your baby is getting enough milk if:   Your baby is actively sucking and you hear swallowing.   Your baby seems relaxed and satisfied after a feeding.   Your baby nurses at least 8 12 times in a 24 hour time period. Nurse your baby until he or she unlatches or falls asleep at the first breast (at least 10 20 minutes), then offer the second side.   Your baby is wetting 5 6 disposable diapers (6 8 cloth diapers) in a 24 hour period by 5 6 days of age.   Your baby is having at least 3 4 stools every 24 hours for the first 6 weeks. The stool should be soft and yellow.   Your baby should gain 4 7 ounces per week after he or she is 4 days old.   Your breasts feel softer after nursing.  REDUCING BREAST ENGORGEMENT  In the first week after your baby is born, you may experience signs of breast engorgement. When breasts are engorged, they feel heavy, warm, full, and may be tender to the touch. You can reduce engorgement if you:   Nurse frequently, every 2 3 hours. Mothers who breastfeed early and often have fewer problems with engorgement.   Place light ice packs on your breasts for 10 20 minutes between feedings. This reduces swelling. Wrap the ice packs in a lightweight towel to protect your skin. Bags of frozen vegetables work well for this purpose.   Take a warm shower or apply warm, moist heat to your breast for 5 10 minutes just before  each feeding. This increases circulation and helps the milk flow.   Gently massage your breast before and during the feeding. Using your finger tips, massage from the chest wall towards your nipple in a circular motion.   Make sure that the baby empties at least one breast at every feeding before switching sides.   Use a breast pump to empty the breasts if your baby is sleepy or not nursing well. You may also want to pump if you are returning to work oryou feel you are getting engorged.   Avoid bottle feeds, pacifiers, or supplemental feedings of water or juice in place of breastfeeding. Breast milk is all the food your baby needs. It is not necessary for your baby to have water or formula. In fact, to help your breasts make more milk, it is best not to give your baby supplemental feedings during the early weeks.   Be sure the baby is latched on and positioned properly while breastfeeding.   Wear a supportive bra, avoiding underwire styles.   Eat a balanced diet with enough fluids.   Rest often, relax, and take your prenatal vitamins to prevent fatigue, stress, and anemia.  If you follow these suggestions, your engorgement should improve in 24 48 hours. If you are still experiencing difficulty, call your lactation consultant or caregiver.  CARING FOR YOURSELF Take care of your   breasts  Bathe or shower daily.   Avoid using soap on your nipples.   Start feedings on your left breast at one feeding and on your right breast at the next feeding.   You will notice an increase in your milk supply 2 5 days after delivery. You may feel some discomfort from engorgement, which makes your breasts very firm and often tender. Engorgement "peaks" out within 24 48 hours. In the meantime, apply warm moist towels to your breasts for 5 10 minutes before feeding. Gentle massage and expression of some milk before feeding will soften your breasts, making it easier for your baby to latch on.    Wear a well-fitting nursing bra, and air dry your nipples for a 3 4minutes after each feeding.   Only use cotton bra pads.   Only use pure lanolin on your nipples after nursing. You do not need to wash it off before feeding the baby again. Another option is to express a few drops of breast milk and gently massage it into your nipples.  Take care of yourself  Eat well-balanced meals and nutritious snacks.   Drinking milk, fruit juice, and water to satisfy your thirst (about 8 glasses a day).   Get plenty of rest.  Avoid foods that you notice affect the baby in a bad way.  SEEK MEDICAL CARE IF:   You have difficulty with breastfeeding and need help.   You have a hard, red, sore area on your breast that is accompanied by a fever.   Your baby is too sleepy to eat well or is having trouble sleeping.   Your baby is wetting less than 6 diapers a day, by 5 days of age.   Your baby's skin or white part of his or her eyes is more yellow than it was in the hospital.   You feel depressed.  Document Released: 01/26/2005 Document Revised: 07/28/2011 Document Reviewed: 04/26/2011 [employer] Patient Information 2013 [employer].  

## 2012-02-18 NOTE — Progress Notes (Signed)
NST reviewed and reactive. Not taking Lovenox secondary to cost.  Has not seen Oncology yet.

## 2012-02-18 NOTE — Progress Notes (Signed)
Pulse- 96 Patient reports a lot of vaginal pressure & pain as well as occasional contractions. Patient still reports decreased fetal movement, sometimes she will go all day without feeling the baby till nighttime then she will feel the baby move once; hasn't felt baby move yet today

## 2012-02-20 ENCOUNTER — Inpatient Hospital Stay (HOSPITAL_COMMUNITY)
Admission: AD | Admit: 2012-02-20 | Discharge: 2012-02-21 | Disposition: A | Discharge status: Home / Self Care | Payer: Self-pay | Source: Ambulatory Visit | Attending: Obstetrics & Gynecology | Admitting: Obstetrics & Gynecology

## 2012-02-20 DIAGNOSIS — O479 False labor, unspecified: Secondary | ICD-10-CM | POA: Insufficient documentation

## 2012-02-20 NOTE — MAU Note (Signed)
Contractions since 1930

## 2012-02-25 ENCOUNTER — Inpatient Hospital Stay (HOSPITAL_COMMUNITY): Payer: Self-pay

## 2012-02-25 ENCOUNTER — Encounter (HOSPITAL_COMMUNITY): Payer: Self-pay | Admitting: *Deleted

## 2012-02-25 ENCOUNTER — Ambulatory Visit (INDEPENDENT_AMBULATORY_CARE_PROVIDER_SITE_OTHER): Payer: Medicaid Other | Admitting: Family

## 2012-02-25 ENCOUNTER — Inpatient Hospital Stay (HOSPITAL_COMMUNITY)
Admission: AD | Admit: 2012-02-25 | Discharge: 2012-02-25 | Disposition: A | Discharge status: Home / Self Care | Payer: Self-pay | Source: Ambulatory Visit | Attending: Obstetrics and Gynecology | Admitting: Obstetrics and Gynecology

## 2012-02-25 VITALS — BP 149/93 | Temp 97.0°F | Wt 271.5 lb

## 2012-02-25 DIAGNOSIS — O0940 Supervision of pregnancy with grand multiparity, unspecified trimester: Secondary | ICD-10-CM

## 2012-02-25 DIAGNOSIS — Z3009 Encounter for other general counseling and advice on contraception: Secondary | ICD-10-CM

## 2012-02-25 DIAGNOSIS — O3660X0 Maternal care for excessive fetal growth, unspecified trimester, not applicable or unspecified: Secondary | ICD-10-CM

## 2012-02-25 DIAGNOSIS — Z8759 Personal history of other complications of pregnancy, childbirth and the puerperium: Secondary | ICD-10-CM

## 2012-02-25 DIAGNOSIS — Z86718 Personal history of other venous thrombosis and embolism: Secondary | ICD-10-CM | POA: Insufficient documentation

## 2012-02-25 LAB — POCT URINALYSIS DIP (DEVICE)
Glucose, UA: NEGATIVE mg/dL
Leukocytes, UA: NEGATIVE
Protein, ur: NEGATIVE mg/dL
Urobilinogen, UA: 0.2 mg/dL (ref 0.0–1.0)

## 2012-02-25 LAB — COMPREHENSIVE METABOLIC PANEL
AST: 16 U/L (ref 0–37)
Albumin: 2.5 g/dL — ABNORMAL LOW (ref 3.5–5.2)
Alkaline Phosphatase: 137 U/L — ABNORMAL HIGH (ref 39–117)
Calcium: 10.4 mg/dL (ref 8.4–10.5)
Creatinine, Ser: 0.51 mg/dL (ref 0.50–1.10)
GFR calc non Af Amer: >90 mL/min (ref >90)
Glucose, Bld: 103 mg/dL — ABNORMAL HIGH (ref 70–99)
Potassium: 3.9 mEq/L (ref 3.5–5.1)
Total Protein: 6.9 g/dL (ref 6.0–8.3)

## 2012-02-25 LAB — PROTEIN / CREATININE RATIO, URINE
Protein Creatinine Ratio: 0.14 (ref 0.00–0.15)
Total Protein, Urine: 18.3 mg/dL

## 2012-02-25 LAB — CBC
MCH: 26.1 pg (ref 26.0–34.0)
MCHC: 32.2 g/dL (ref 30.0–36.0)
MCV: 81 fL (ref 78.0–100.0)
Platelets: 343 10*3/uL (ref 150–400)
RDW: 19.7 % — ABNORMAL HIGH (ref 11.5–15.5)

## 2012-02-25 NOTE — MAU Note (Signed)
Pt sent from clinic for further eval of BP's, PIH work up

## 2012-02-25 NOTE — MAU Provider Note (Signed)
Chief Complaint:  PIH eval   First Provider Initiated Contact with Patient 02/25/12 1116     HPI: Cynthia Scott is a 32 y.o. [id number] at [redacted]w[redacted]d who was sent to maternity admissions from clinic for elevated BP 149/93, scotomoa and epigastric pain which she attributes to heartburn. Denies contractions, leakage of fluid or vaginal bleeding. Decreased fetal movement, but batter than last week.    Past Medical History: Past Medical History  Diagnosis Date  . Blood transfusion 01/25/2009  . Urinary tract infection   . 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 Anemia   . Non-Hodgkin lymphoma   . 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 Cancer   . Non Hodgkin's lymphoma 2012  . DVT (deep venous thrombosis)     clots 2010 were in left leg  . Hodgkins lymphoma     pt sttes she was supposed to get treatment but showed up pregnant  . Obesity complicating pregnancy in third trimester     Past obstetric history: OB History    Grav Para Term Preterm Abortions TAB SAB Ect Mult Living   10 5 5  4  0 4   5     # Outc Date GA Lbr Len/2nd Wgt Sex Del Anes PTL Lv   1 TRM 7/02 [redacted]w[redacted]d  7lb14oz(3.572kg) M SVD None  Yes   2 TRM 8/04 [redacted]w[redacted]d  9lb12oz(4.423kg) F SVD None  Yes   3 TRM 8/05 [redacted]w[redacted]d  8lb7oz(3.827kg) M SVD None  Yes   4 TRM 8/08 [redacted]w[redacted]d  7lb(3.175kg) M SVD None  Yes   5 TRM 6/12 [redacted]w[redacted]d  5lb(2.268kg) M SVD None  Yes   6 SAB            7 SAB            8 SAB            9 SAB            10 CUR               Past Surgical History: Past Surgical History  Procedure Date  . Rhinoplasty   . Port-a-cath removal   . Chest wall tumor excision   . Tonsillectomy   . Wisdom tooth extraction 2010  . Appendectomy   . Cholecystectomy   . Tubes in ears   . Turmor in chest     Family History: Family History  Problem Relation Age of Onset  . Anesthesia problems Neg Hx   . Other Neg Hx   . Heart disease Mother   . COPD Mother   . Diabetes Mother   . Heart disease Maternal Aunt   . Diabetes Maternal Aunt    . Cancer Maternal Uncle   . Heart disease Maternal Grandmother     Social History: History  Substance Use Topics  . Smoking status: Current Every Day Smoker -- 0.2 packs/day for 13 years    Types: Cigarettes  . Smokeless tobacco: Never Used  . Alcohol Use: No    Allergies: No Known Allergies  Meds:  Prescriptions prior to admission  Medication Sig Dispense Refill  . acetaminophen (TYLENOL) 500 MG tablet Take 1,000 mg by mouth every 6 (six) hours as needed. Takes for pain      . calcium carbonate (TUMS - DOSED IN MG ELEMENTAL CALCIUM) 500 MG chewable tablet Chew 1 tablet by mouth 4 (four) times daily as needed. For heartburn      . flintstones complete (FLINTSTONES) 60 MG  chewable tablet Chew 3 tablets by mouth daily.      Marland Kitchen zolpidem (AMBIEN) 10 MG tablet Take 1 tablet (10 mg total) by mouth at bedtime as needed for sleep.  30 tablet  3    ROS: Pertinent findings in history of present illness.  Physical Exam  Blood pressure 101/45, pulse 101, last menstrual period 05/15/2011. GENERAL: Well-developed, well-nourished female in no acute distress.  HEENT: normocephalic HEART: normal rate RESP: normal effort ABDOMEN: Soft, non-tender, gravid appropriate for gestational age EXTREMITIES: Nontender, 2+ pedal edema. Calves NT.  NEURO: alert and oriented SPECULUM EXAM: deferred  FHT:  Baseline 140 , moderate variability, accelerations present, no decelerations. Pos FM. Contractions: none   Labs: Results for orders placed during the hospital encounter of 02/25/12 (from the past 24 hour(s))  CBC     Status: Abnormal   Collection Time   02/25/12 11:21 AM      Component Value Range   WBC 12.4 (*) 4.0 - 10.5 K/uL   RBC 3.99  3.87 - 5.11 MIL/uL   Hemoglobin 10.4 (*) 12.0 - 15.0 g/dL   HCT 16.1 (*) 09.6 - 04.5 %   MCV 81.0  78.0 - 100.0 fL   MCH 26.1  26.0 - 34.0 pg   MCHC 32.2  30.0 - 36.0 g/dL   RDW 40.9 (*) 81.1 - 91.4 %   Platelets 343  150 - 400 K/uL  COMPREHENSIVE  METABOLIC PANEL     Status: Abnormal   Collection Time   02/25/12 11:21 AM      Component Value Range   Sodium 135  135 - 145 mEq/L   Potassium 3.9  3.5 - 5.1 mEq/L   Chloride 103  96 - 112 mEq/L   CO2 18 (*) 19 - 32 mEq/L   Glucose, Bld 103 (*) 70 - 99 mg/dL   BUN 10  6 - 23 mg/dL   Creatinine, Ser 7.82  0.50 - 1.10 mg/dL   Calcium 95.6  8.4 - 21.3 mg/dL   Total Protein 6.9  6.0 - 8.3 g/dL   Albumin 2.5 (*) 3.5 - 5.2 g/dL   AST 16  0 - 37 U/L   ALT 19  0 - 35 U/L   Alkaline Phosphatase 137 (*) 39 - 117 U/L   Total Bilirubin 0.3  0.3 - 1.2 mg/dL   GFR calc non Af Amer >90  >90 mL/min   GFR calc Af Amer >90  >90 mL/min    Imaging:  EFW 10-4, Normal AFI, vtx  MAU Course:   Assessment: Transient elevated BP in pregnancy. No evidence of Pre-eclampsia. Suspected Macrosomia  Plan: Discharge home Labor and PIH precautions and fetal kick counts Follow-up Information    Follow up with [facility]. On 03/03/2012. (Keep scheduled appointment.)    Contact information:   [address] [place] [address]            Medication List     As of 02/25/2012 12:26 PM    ASK your doctor about these medications         acetaminophen 500 MG tablet   Commonly known as: TYLENOL   Take 1,000 mg by mouth every 6 (six) hours as needed. Takes for pain      calcium carbonate 500 MG chewable tablet   Commonly known as: TUMS - dosed in mg elemental calcium   Chew 1 tablet by mouth 4 (four) times daily as needed. For heartburn      flintstones complete  60 MG chewable tablet   Chew 3 tablets by mouth daily.      zolpidem 10 MG tablet   Commonly known as: AMBIEN   Take 1 tablet (10 mg total) by mouth at bedtime as needed for sleep.        [place], CNM 02/25/2012 12:26 PM

## 2012-02-25 NOTE — Progress Notes (Signed)
C/o edema in feet/legs. States baby still not moving a lot, but a little more than last week. States ready to have baby.States sees stars at times, states has headaches often- uses percocet with relief

## 2012-02-25 NOTE — MAU Note (Addendum)
When pharmacy is reviewing meds- Pt stated is not taking lovenox, protonix or percocet, never picked them up- states can not afford them.

## 2012-02-25 NOTE — Progress Notes (Signed)
No report of headaches, +vision changes ("sees stars"); +epigastric pain;  MAU for labs and growth [place].  Fundal height - 44cm

## 2012-03-02 ENCOUNTER — Inpatient Hospital Stay (HOSPITAL_COMMUNITY)
Admission: AD | Admit: 2012-03-02 | Discharge: 2012-03-02 | Disposition: A | Discharge status: Home / Self Care | Payer: Self-pay | Source: Ambulatory Visit | Attending: Obstetrics & Gynecology | Admitting: Obstetrics & Gynecology

## 2012-03-02 ENCOUNTER — Encounter (HOSPITAL_COMMUNITY): Payer: Self-pay | Admitting: *Deleted

## 2012-03-02 DIAGNOSIS — O368190 Decreased fetal movements, unspecified trimester, not applicable or unspecified: Secondary | ICD-10-CM | POA: Insufficient documentation

## 2012-03-02 DIAGNOSIS — O479 False labor, unspecified: Secondary | ICD-10-CM

## 2012-03-02 MED ORDER — BUTORPHANOL TARTRATE 1 MG/ML IJ SOLN
1.0000 mg | Freq: Once | INTRAMUSCULAR | Status: AC
  Administered 2012-03-02: 1 mg via INTRAMUSCULAR
  Filled 2012-03-02: qty 1

## 2012-03-02 NOTE — MAU Note (Signed)
Pt states she started feeling pressure last night and has felt pressure all night.pt states she feels like her baby is going  To fall out

## 2012-03-02 NOTE — MAU Note (Signed)
Pt requested that I go and let W. Muhammed CNM that pt is here. Pt saw W. Muhammed CNM on her way to the room

## 2012-03-02 NOTE — MAU Note (Signed)
Patient states she is having frequent contractions, has a green discharge with wiping. Reports fetal movement but less than usual.

## 2012-03-02 NOTE — Progress Notes (Signed)
Pt states she feels pressure when she stands up

## 2012-03-02 NOTE — MAU Provider Note (Signed)
History     CSN: [id number]  Arrival date and time: 03/02/12 1612   First Provider Initiated Contact with Patient 03/02/12 1652      Chief Complaint  Patient presents with  . Labor Eval   HPI  Pt is a [id number] at 39.3 wks IUP here with report of contractions and decreased fetal movement that started earlier today.  Denies vaginal bleeding or leaking of fluid.  No report of headache, epigastric pain, or vision changes.    Past Medical History  Diagnosis Date  . Blood transfusion 01/25/2009  . Urinary tract infection   . 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 Anemia   . Non-Hodgkin lymphoma   . 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 Cancer   . Non Hodgkin's lymphoma 2012  . DVT (deep venous thrombosis)     clots 2010 were in left leg  . Hodgkins lymphoma     pt sttes she was supposed to get treatment but showed up pregnant  . Obesity complicating pregnancy in third trimester     Past Surgical History  Procedure Date  . Rhinoplasty   . Port-a-cath removal   . Chest wall tumor excision   . Tonsillectomy   . Wisdom tooth extraction 2010  . Appendectomy   . Cholecystectomy   . Tubes in ears   . Turmor in chest     Family History  Problem Relation Age of Onset  . Anesthesia problems Neg Hx   . Other Neg Hx   . Heart disease Mother   . COPD Mother   . Diabetes Mother   . Heart disease Maternal Aunt   . Diabetes Maternal Aunt   . Cancer Maternal Uncle   . Heart disease Maternal Grandmother     History  Substance Use Topics  . Smoking status: Current Every Day Smoker -- 0.2 packs/day for 13 years    Types: Cigarettes  . Smokeless tobacco: Never Used  . Alcohol Use: No    Allergies: No Known Allergies  Prescriptions prior to admission  Medication Sig Dispense Refill  . acetaminophen (TYLENOL) 500 MG tablet Take 1,000 mg by mouth every 6 (six) hours as needed. Takes for pain in head      . calcium carbonate (TUMS - DOSED IN MG ELEMENTAL CALCIUM) 500 MG chewable  tablet Chew 1 tablet by mouth 4 (four) times daily as needed. For heartburn      . flintstones complete (FLINTSTONES) 60 MG chewable tablet Chew 3 tablets by mouth daily.      Marland Kitchen zolpidem (AMBIEN) 10 MG tablet Take 1 tablet (10 mg total) by mouth at bedtime as needed for sleep.  30 tablet  3    Review of Systems  Constitutional:       Decreased fetal movement  Eyes: Negative.   Gastrointestinal: Positive for abdominal pain (contractions).  Neurological: Negative for headaches.  All other systems reviewed and are negative.   Physical Exam   Height 5\' 2"  (1.575 m), weight 125.102 kg (275 lb 12.8 oz), last menstrual period 05/15/2011. Filed Vitals:   03/02/12 1622 03/02/12 1805  BP:  110/51  Pulse:  104  Temp:  97.2 F (36.2 C)  TempSrc:  Oral  Resp:  18  Height: 5\' 2"  (1.575 m)   Weight: 125.102 kg (275 lb 12.8 oz)     Physical Exam  Constitutional: She is oriented to person, place, and time. She appears well-developed and well-nourished. No distress.  HENT:  Head: Normocephalic.  Neck: Normal range of motion. Neck supple.  Cardiovascular: Normal rate, regular rhythm and normal heart sounds.   Respiratory: Effort normal and breath sounds normal. No respiratory distress.  GI: Soft. There is no tenderness.  Genitourinary: No bleeding around the vagina. Vaginal discharge (mucusy) found.  Musculoskeletal: Normal range of motion. Edema: 1+ bilat pedal edema.  Neurological: She is alert and oriented to person, place, and time.  Skin: Skin is warm and dry.   Dilation: 3 Effacement (%): 50 Cervical Position: Posterior Station: -3 Presentation: Vertex Exam by:: Roney Marion, CNM  MAU Course  Procedures No results found for this or any previous visit (from the past 24 hour(s)).  FHR 150's, +accels, reactive Toco - poor tracing Assessment and Plan  Braxton Hicks  Plan: DC to home Keep scheduled appointment for tomorrow Labor precautions  [facility] 03/02/2012,  5:59 PM

## 2012-03-02 NOTE — Discharge Instructions (Signed)
Braxton Hicks Contractions  Pregnancy is commonly associated with contractions of the uterus throughout the pregnancy. Towards the end of pregnancy (32 to 34 weeks), these contractions (Braxton Hicks) can develop more often and may become more forceful. This is not true labor because these contractions do not result in opening (dilatation) and thinning of the cervix. They are sometimes difficult to tell apart from true labor because these contractions can be forceful and people have different pain tolerances. You should not feel embarrassed if you go to the hospital with false labor. Sometimes, the only way to tell if you are in true labor is for your caregiver to follow the changes in the cervix.  How to tell the difference between true and false labor:  · False labor.  · The contractions of false labor are usually shorter, irregular and not as hard as those of true labor.  · They are often felt in the front of the lower abdomen and in the groin.  · They may leave with walking around or changing positions while lying down.  · They get weaker and are shorter lasting as time goes on.  · These contractions are usually irregular.  · They do not usually become progressively stronger, regular and closer together as with true labor.  · True labor.  · Contractions in true labor last 30 to 70 seconds, become very regular, usually become more intense, and increase in frequency.  · They do not go away with walking.  · The discomfort is usually felt in the top of the uterus and spreads to the lower abdomen and low back.  · True labor can be determined by your caregiver with an exam. This will show that the cervix is dilating and getting thinner.  If there are no prenatal problems or other health problems associated with the pregnancy, it is completely safe to be sent home with false labor and await the onset of true labor.  HOME CARE INSTRUCTIONS   · Keep up with your usual exercises and instructions.  · Take medications as  directed.  · Keep your regular prenatal appointment.  · Eat and drink lightly if you think you are going into labor.  · If BH contractions are making you uncomfortable:  · Change your activity position from lying down or resting to walking/walking to resting.  · Sit and rest in a tub of warm water.  · Drink 2 to 3 glasses of water. Dehydration may cause B-H contractions.  · Do slow and deep breathing several times an hour.  SEEK IMMEDIATE MEDICAL CARE IF:   · Your contractions continue to become stronger, more regular, and closer together.  · You have a gushing, burst or leaking of fluid from the vagina.  · An oral temperature above 102° F (38.9° C) develops.  · You have passage of blood-tinged mucus.  · You develop vaginal bleeding.  · You develop continuous belly (abdominal) pain.  · You have low back pain that you never had before.  · You feel the baby's head pushing down causing pelvic pressure.  · The baby is not moving as much as it used to.  Document Released: 01/26/2005 Document Revised: 04/20/2011 Document Reviewed: 07/20/2008  ExitCare® Patient Information ©2013 [employer].

## 2012-03-02 NOTE — MAU Provider Note (Signed)
Attestation of Attending Supervision of Advanced Practitioner (CNM/NP): Evaluation and management procedures were performed by the Advanced Practitioner under my supervision and collaboration.  I have reviewed the Advanced Practitioner's note and chart, and I agree with the management and plan.  Indria Bishara 03/02/2012 8:16 AM

## 2012-03-03 ENCOUNTER — Inpatient Hospital Stay (HOSPITAL_COMMUNITY)
Admission: AD | Admit: 2012-03-03 | Discharge: 2012-03-03 | Disposition: A | Discharge status: Home / Self Care | Payer: Self-pay | Source: Ambulatory Visit | Attending: Obstetrics & Gynecology | Admitting: Obstetrics & Gynecology

## 2012-03-03 ENCOUNTER — Inpatient Hospital Stay (HOSPITAL_COMMUNITY)
Admission: AD | Admit: 2012-03-03 | Discharge: 2012-03-05 | DRG: 775 | Disposition: A | Discharge status: Home / Self Care | Payer: Self-pay | Source: Ambulatory Visit | Attending: Obstetrics & Gynecology | Admitting: Obstetrics & Gynecology

## 2012-03-03 ENCOUNTER — Encounter (HOSPITAL_COMMUNITY): Payer: Self-pay | Admitting: *Deleted

## 2012-03-03 ENCOUNTER — Encounter (HOSPITAL_COMMUNITY): Payer: Self-pay

## 2012-03-03 ENCOUNTER — Ambulatory Visit (INDEPENDENT_AMBULATORY_CARE_PROVIDER_SITE_OTHER): Payer: Self-pay | Admitting: Advanced Practice Midwife

## 2012-03-03 VITALS — BP 143/92 | Temp 97.2°F | Wt 269.9 lb

## 2012-03-03 DIAGNOSIS — Z3009 Encounter for other general counseling and advice on contraception: Secondary | ICD-10-CM

## 2012-03-03 DIAGNOSIS — O3660X0 Maternal care for excessive fetal growth, unspecified trimester, not applicable or unspecified: Secondary | ICD-10-CM

## 2012-03-03 DIAGNOSIS — E669 Obesity, unspecified: Secondary | ICD-10-CM

## 2012-03-03 DIAGNOSIS — O99210 Obesity complicating pregnancy, unspecified trimester: Secondary | ICD-10-CM

## 2012-03-03 DIAGNOSIS — O99330 Smoking (tobacco) complicating pregnancy, unspecified trimester: Secondary | ICD-10-CM

## 2012-03-03 DIAGNOSIS — R109 Unspecified abdominal pain: Secondary | ICD-10-CM | POA: Insufficient documentation

## 2012-03-03 DIAGNOSIS — O0940 Supervision of pregnancy with grand multiparity, unspecified trimester: Secondary | ICD-10-CM

## 2012-03-03 DIAGNOSIS — O479 False labor, unspecified: Secondary | ICD-10-CM | POA: Insufficient documentation

## 2012-03-03 DIAGNOSIS — M79609 Pain in unspecified limb: Secondary | ICD-10-CM | POA: Insufficient documentation

## 2012-03-03 DIAGNOSIS — O09899 Supervision of other high risk pregnancies, unspecified trimester: Secondary | ICD-10-CM

## 2012-03-03 DIAGNOSIS — O139 Gestational [pregnancy-induced] hypertension without significant proteinuria, unspecified trimester: Principal | ICD-10-CM | POA: Diagnosis present

## 2012-03-03 DIAGNOSIS — Z86718 Personal history of other venous thrombosis and embolism: Secondary | ICD-10-CM

## 2012-03-03 DIAGNOSIS — O361190 Maternal care for Anti-A sensitization, unspecified trimester, not applicable or unspecified: Secondary | ICD-10-CM

## 2012-03-03 HISTORY — DX: Gestational (pregnancy-induced) hypertension without significant proteinuria, unspecified trimester: O13.9

## 2012-03-03 LAB — COMPREHENSIVE METABOLIC PANEL
ALT: 21 U/L (ref 0–35)
Alkaline Phosphatase: 158 U/L — ABNORMAL HIGH (ref 39–117)
BUN: 10 mg/dL (ref 6–23)
CO2: 18 mEq/L — ABNORMAL LOW (ref 19–32)
Calcium: 9.8 mg/dL (ref 8.4–10.5)
GFR calc Af Amer: >90 mL/min (ref >90)
GFR calc non Af Amer: >90 mL/min (ref >90)
Glucose, Bld: 96 mg/dL (ref 70–99)
Potassium: 4.1 mEq/L (ref 3.5–5.1)
Sodium: 134 mEq/L — ABNORMAL LOW (ref 135–145)
Total Protein: 6.6 g/dL (ref 6.0–8.3)

## 2012-03-03 LAB — CBC WITH DIFFERENTIAL/PLATELET
Eosinophils Absolute: 0.1 10*3/uL (ref 0.0–0.7)
Eosinophils Relative: 1 % (ref 0–5)
Hemoglobin: 10.5 g/dL — ABNORMAL LOW (ref 12.0–15.0)
Lymphocytes Relative: 17 % (ref 12–46)
Lymphs Abs: 2.1 10*3/uL (ref 0.7–4.0)
MCH: 26.3 pg (ref 26.0–34.0)
MCV: 81.3 fL (ref 78.0–100.0)
Monocytes Relative: 5 % (ref 3–12)
RBC: 4 MIL/uL (ref 3.87–5.11)
WBC: 12.5 10*3/uL — ABNORMAL HIGH (ref 4.0–10.5)

## 2012-03-03 LAB — POCT URINALYSIS DIP (DEVICE)
Bilirubin Urine: NEGATIVE
Glucose, UA: NEGATIVE mg/dL
Ketones, ur: NEGATIVE mg/dL
Protein, ur: NEGATIVE mg/dL

## 2012-03-03 LAB — PROTEIN / CREATININE RATIO, URINE: Total Protein, Urine: 9.6 mg/dL

## 2012-03-03 LAB — URIC ACID: Uric Acid, Serum: 7.9 mg/dL — ABNORMAL HIGH (ref 2.4–7.0)

## 2012-03-03 MED ORDER — ALUM & MAG HYDROXIDE-SIMETH 200-200-20 MG/5ML PO SUSP
15.0000 mL | ORAL | Status: DC | PRN
  Administered 2012-03-03 – 2012-03-04 (×2): 15 mL via ORAL
  Filled 2012-03-03 (×3): qty 30

## 2012-03-03 MED ORDER — OXYTOCIN 40 UNITS IN LACTATED RINGERS INFUSION - SIMPLE MED
1.0000 m[IU]/min | INTRAVENOUS | Status: DC
  Administered 2012-03-03: 1 m[IU]/min via INTRAVENOUS
  Filled 2012-03-03: qty 1000

## 2012-03-03 MED ORDER — CITRIC ACID-SODIUM CITRATE 334-500 MG/5ML PO SOLN
30.0000 mL | ORAL | Status: DC | PRN
  Administered 2012-03-03: 30 mL via ORAL
  Filled 2012-03-03: qty 15

## 2012-03-03 MED ORDER — FENTANYL CITRATE 0.05 MG/ML IJ SOLN
100.0000 ug | Freq: Once | INTRAMUSCULAR | Status: AC
  Administered 2012-03-03: 100 ug via INTRAVENOUS
  Filled 2012-03-03: qty 2

## 2012-03-03 MED ORDER — BUTORPHANOL TARTRATE 1 MG/ML IJ SOLN
1.0000 mg | INTRAMUSCULAR | Status: DC | PRN
  Administered 2012-03-03 – 2012-03-04 (×2): 1 mg via INTRAVENOUS
  Filled 2012-03-03 (×2): qty 1

## 2012-03-03 MED ORDER — LACTATED RINGERS IV SOLN: 500.0000 mL | INTRAVENOUS | Status: DC | PRN

## 2012-03-03 MED ORDER — ZOLPIDEM TARTRATE 5 MG PO TABS
5.0000 mg | ORAL_TABLET | Freq: Once | ORAL | Status: DC
  Filled 2012-03-03: qty 1

## 2012-03-03 MED ORDER — OXYTOCIN 40 UNITS IN LACTATED RINGERS INFUSION - SIMPLE MED: 1.0000 m[IU]/min | INTRAVENOUS | Status: DC

## 2012-03-03 MED ORDER — FLEET ENEMA 7-19 GM/118ML RE ENEM: 1.0000 | ENEMA | RECTAL | Status: DC | PRN

## 2012-03-03 MED ORDER — IBUPROFEN 600 MG PO TABS
600.0000 mg | ORAL_TABLET | Freq: Four times a day (QID) | ORAL | Status: DC | PRN
  Administered 2012-03-04: 600 mg via ORAL
  Filled 2012-03-03: qty 1

## 2012-03-03 MED ORDER — OXYCODONE-ACETAMINOPHEN 5-325 MG PO TABS: 1.0000 | ORAL_TABLET | ORAL | Status: DC | PRN

## 2012-03-03 MED ORDER — LIDOCAINE HCL (PF) 1 % IJ SOLN
30.0000 mL | INTRAMUSCULAR | Status: DC | PRN
  Filled 2012-03-03: qty 30

## 2012-03-03 MED ORDER — OXYTOCIN BOLUS FROM INFUSION
500.0000 mL | INTRAVENOUS | Status: DC
  Administered 2012-03-04: 500 mL via INTRAVENOUS

## 2012-03-03 MED ORDER — ACETAMINOPHEN 325 MG PO TABS: 650.0000 mg | ORAL_TABLET | ORAL | Status: DC | PRN

## 2012-03-03 MED ORDER — TERBUTALINE SULFATE 1 MG/ML IJ SOLN: 0.2500 mg | Freq: Once | INTRAMUSCULAR | Status: AC | PRN

## 2012-03-03 MED ORDER — LACTATED RINGERS IV SOLN: INTRAVENOUS | Status: DC

## 2012-03-03 MED ORDER — OXYTOCIN 40 UNITS IN LACTATED RINGERS INFUSION - SIMPLE MED
62.5000 mL/h | INTRAVENOUS | Status: DC
  Administered 2012-03-04: 62.5 mL/h via INTRAVENOUS

## 2012-03-03 MED ORDER — ONDANSETRON HCL 4 MG/2ML IJ SOLN: 4.0000 mg | Freq: Four times a day (QID) | INTRAMUSCULAR | Status: DC | PRN

## 2012-03-03 NOTE — Progress Notes (Signed)
Frequent mild-mod UC's. No change from last night in MAU. Pos bloody show. Reports left frontal HA since yesterday, scotoma and intermittent epigastric pain x 2 weeks. Normal PIH work-up 1/16. Consulted w/ Dr. Shawnie Pons RE: elevated BP x 2 visits. Direct admit to BS for IOL for Gest HTN vs Pre-E.

## 2012-03-03 NOTE — Progress Notes (Signed)
Subjective: Pt is doing well, not feeling too much in regards of contractions   Objective: BP 115/62  Pulse 92  Temp 98.2 F (36.8 C) (Oral)  Resp 20  Ht 5\' 2"  (1.575 m)  Wt 122.018 kg (269 lb)  BMI 49.20 kg/m2  LMP 05/15/2011  Breastfeeding? Unknown      FHT:  FHR: 130 bpm, variability: moderate,  accelerations:  Present,  decelerations:  Absent UC:   irregular, every 5-57minutes SVE:   Dilation: 5 Effacement (%): 60 Station: -2 Exam by:: Dr. Paulina Fusi, rn  Labs: Lab Results  Component Value Date   WBC 12.5* 03/03/2012   HGB 10.5* 03/03/2012   HCT 32.5* 03/03/2012   MCV 81.3 03/03/2012   PLT 352 03/03/2012    Assessment / Plan: Induction of labor due to preeclampsia,  progressing well on pitocin  Labor: Progressing on Pitocin, will continue to increase then AROM Preeclampsia:  Pending P/C ratio, no S/Sx at this time  Fetal Wellbeing:  Category I Pain Control:  Labor support without medications I/D:  n/a Anticipated MOD:  NSVD  Cynthia Garside R. Paulina Fusi, DO of [facility] 03/03/2012, 7:47 PM

## 2012-03-03 NOTE — Progress Notes (Signed)
Last night (03/02/12) contractions began coming every 5 mins.  Pt. Went to MAU.  Patient states she was given an [place] and sent home.  Patient feels lots of pain and pressure, has a blood tinged mucous like discharge.   p=126

## 2012-03-03 NOTE — Progress Notes (Signed)
Cynthia Scott is a 32 y.o. [id number] at [redacted]w[redacted]d by admitted for induction of labor due to Hypertension.  Subjective: Wants stadol so she can take a nap, not feeling contractions.   Objective: BP 104/62  Pulse 92  Temp 97.6 F (36.4 C) (Oral)  Resp 20  Ht 5\' 2"  (1.575 m)  Wt 122.018 kg (269 lb)  BMI 49.20 kg/m2  LMP 05/15/2011  Breastfeeding? Unknown      FHT:  FHR: 145 bpm, variability: moderate,  accelerations:  Present,  decelerations:  Absent UC:   irregular, every 1-10 minutes SVE:   Dilation: 4 Effacement (%): 60 Station: -2;-3 Exam by:: [email or web address]-[email or web address], rnc  Labs: Lab Results  Component Value Date   WBC 12.5* 03/03/2012   HGB 10.5* 03/03/2012   HCT 32.5* 03/03/2012   MCV 81.3 03/03/2012   PLT 352 03/03/2012    Assessment / Plan: Induction of labor due to gestational hypertension, on pitocin at 16, no in labor  Labor: Not Progressing on Pitocin, will continue to increase then AROM Fetal Wellbeing:  Category I Pain Control:  Fentanyl and stadol Anticipated MOD:  NSVD  Mone Commisso 03/03/2012, 11:10 PM

## 2012-03-03 NOTE — H&P (Signed)
HPI: Cynthia Scott is a 32 y.o. year old [id number] female at [redacted]w[redacted]d weeks gestation by [place] who presents for possible Pre-eclampsia .  Pt was seen in clinic this AM and was found to have elevated BP and pt was c/o HA and blurred vision x 24 hrs.  Pt was sent up for admission for pre-eclampsia w/u and for possible induction.  Pt was 3-4/50/-3 when checked in clinic.   Pt has Hx of DVT while incarcerated in [place].  Was on Lovenox with multiple interruptions after seeing MFM who eventually decided to not continue this drug.  She is not taking anything currently and denies SOB or unilateral leg pain.   Pt denies worsening edema or abdominal pain including RUQ pain at this time.   Maternal Medical History:  Reason for admission: Reason for Admission:   nauseaIOL for GHTN vs Pre-eclampsia    Contractions: Onset was less than 1 hour ago.   Frequency: irregular.    Fetal activity: Perceived fetal activity is normal.    Prenatal complications: Hypertension.   No preterm labor.   Prenatal Complications - Diabetes: none.    OB History    Grav Para Term Preterm Abortions TAB SAB Ect Mult Living   10 5 5  4  0 4   5     Past Medical History  Diagnosis Date  . Blood transfusion 01/25/2009  . Urinary tract infection   . 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 Anemia   . Non-Hodgkin lymphoma   . 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 Cancer   . Non Hodgkin's lymphoma 2012  . DVT (deep venous thrombosis)     clots 2010 were in left leg  . Hodgkins lymphoma     pt sttes she was supposed to get treatment but showed up pregnant  . Obesity complicating pregnancy in third trimester   . Pregnancy induced hypertension    Past Surgical History  Procedure Date  . Rhinoplasty   . Port-a-cath removal   . Chest wall tumor excision   . Tonsillectomy   . Wisdom tooth extraction 2010  . Appendectomy   . Cholecystectomy   . Tubes in ears   . Turmor in chest    Family History: family history includes  COPD in her mother; Cancer in her maternal uncle; Diabetes in her maternal aunt and mother; and Heart disease in her maternal aunt, maternal grandmother, and mother.  There is no history of Anesthesia problems. Social History:  reports that she has been smoking Cigarettes.  She has a 3.25 pack-year smoking history. She has never used smokeless tobacco. She reports that she does not drink alcohol or use illicit drugs.   Prenatal Transfer Tool  Maternal Diabetes: No Genetic Screening: Normal Maternal Ultrasounds/Referrals: Normal Fetal Ultrasounds or other Referrals:  Referred to Materal Fetal Medicine , For previous DVT (off and on Lovenox) Maternal Substance Abuse:  Unsure, Current smoker  Significant Maternal Medications:  None Significant Maternal Lab Results:  Lab values include: Group B Strep negative Other Comments:  None  Review of Systems  Constitutional: Negative for fever and chills.  Eyes: Negative for blurred vision, double vision and photophobia.  Respiratory: Negative for cough.   Cardiovascular: Negative for chest pain.  Gastrointestinal: Negative for nausea and vomiting.  Genitourinary: Negative for dysuria.  Neurological: Positive for headaches. Negative for dizziness and focal weakness.      Last menstrual period 05/15/2011, unknown if currently breastfeeding. Maternal Exam:  Abdomen: Patient reports no  abdominal tenderness. Introitus: Normal vagina.  Cervix: Cervix evaluated by digital exam.     Physical Exam  Constitutional: She is oriented to person, place, and time. She appears well-developed and well-nourished. No distress.  Eyes: EOM are normal.  Cardiovascular: Normal rate and regular rhythm.   Respiratory: Breath sounds normal.  GI: Bowel sounds are normal.  Musculoskeletal: Edema: +1 B/L LE   Neurological: She is alert and oriented to person, place, and time. She has normal reflexes.  Skin: Skin is warm and dry.    Prenatal labs: ABO, Rh: --/--/A POS  (12/12 1845) Antibody: POS (12/12 1845) Rubella: 71.9 (08/01 1126) RPR: NON REAC (10/31 1224)  HBsAg: NEGATIVE (08/01 1126)  HIV: NON REACTIVE (08/01 1126)  GBS: Negative (01/02 0000)   Assessment/Plan: 1) IOL for GHTN vs pre-eclampsia - Will get pre-eclampsia labs including CBC, CMP, Uric, LDH, and P/C ratio   1) Will induce with Pitocin  2) Continue to monitor BP closely, may add on Mg if P/C ratio comes back elevated or develops other Sx  3) Concern for PPH, will start Pitocin directly after delivery   Bryan R. Hess, DO of [facility] 03/03/2012, 12:32 PM  I saw and examined patient and agree with above note. I reviewed patient records, notes, labs and studies. Regarding history of DVT, patient was diagnosed with non-Hodgkin's lymphoma in 2010 while incarcerated. She had 6 mos of chemo but never completed XRT due to pregnancy.  She reported arm and leg clots during that time and was on lovenox and coumadin. However, patient has been seen by MFM who reviewed her notes from [place]. It appears that the patient had a Porta-Cath blood clot. MFM's assessment was that she did not need Lovenox during pregnancy. She is seen in high risk clinic. Her pregnancy has been uncomplicated other than elevated blood pressures in the third trimester. Patient admitted for labor induction for gestational hypertension with preeclampsia labs pending. Cervix 3.5 cm, will start pitocin. Of note, ultrasound on 02/25/12 showed LGA baby (>90%) with EFW 10 lb 4 oz. Patient has delivered a 9 lb 12 oz baby vaginally previously. Anticipate SVD. Napoleon Form, MD

## 2012-03-03 NOTE — Progress Notes (Signed)
Pt complaining of pain in both legs

## 2012-03-03 NOTE — Progress Notes (Signed)
Subjective: Pt is doing well, wants to go up on Pitocin as she feels she is not having strong contractions   Objective: BP 114/50  Pulse 96  Temp 97.9 F (36.6 C) (Oral)  Resp 18  LMP 05/15/2011  Breastfeeding? Unknown      FHT:  FHR: 125 bpm, variability: moderate,  accelerations:  Present,  decelerations:  Absent UC:   irregular, every 5-75minutes SVE:   Dilation: 4 Effacement (%): 50 Station: -2 Exam by:: dr Paulina Fusi  Labs: Lab Results  Component Value Date   WBC 12.5* 03/03/2012   HGB 10.5* 03/03/2012   HCT 32.5* 03/03/2012   MCV 81.3 03/03/2012   PLT 352 03/03/2012    Assessment / Plan: Induction of labor due to preeclampsia,  progressing well on pitocin  Labor: Progressing on Pitocin, will continue to increase then AROM Preeclampsia:  Pending P/C ratio, no S/Sx at this time  Fetal Wellbeing:  Category I Pain Control:  Labor support without medications I/D:  n/a Anticipated MOD:  NSVD  Cynthia Scott R. Paulina Fusi, DO of [facility] 03/03/2012, 5:13 PM

## 2012-03-03 NOTE — Progress Notes (Signed)
Patient ID: Cynthia Scott, female   DOB: Mar 18, 1980, 32 y.o.   MRN: [id number] 32 y.o. [id number] at [redacted]w[redacted]d S: patient comfortable in bed, friends at bedside, does mention leg pain and some cramping when prompted. She has not noted any bleeding or loss of fluid, and continues to feel good fetal movement. Would like something to help her sleep while she's uncomfortable. No other complaints  O:  Filed Vitals:   03/03/12 0051  BP: 134/79  Pulse: 103  Temp: 98.2 F (36.8 C)  TempSrc: Oral  Resp: 18   SVE: 3/50/-2 Toco: irregular contractions FHR: 140. + accels, no decels   A: Patient not in active labor, no LOF.   P: Discharge home, as patient not in labor and has not had rupture of membranes. Already has appointment scheduled at 9:00 this morning.  Given one-time dose of Ambien to aide with sleep.  I have seen this patient and agree with the above resident's note.  [place]KIRBY, Su Duma Certified Nurse-Midwife

## 2012-03-03 NOTE — MAU Note (Signed)
Pt states pain is more intense and she has pain in both her legs

## 2012-03-04 ENCOUNTER — Encounter (HOSPITAL_COMMUNITY): Payer: Self-pay | Admitting: *Deleted

## 2012-03-04 DIAGNOSIS — Z86718 Personal history of other venous thrombosis and embolism: Secondary | ICD-10-CM

## 2012-03-04 DIAGNOSIS — O139 Gestational [pregnancy-induced] hypertension without significant proteinuria, unspecified trimester: Secondary | ICD-10-CM

## 2012-03-04 MED ORDER — IBUPROFEN 600 MG PO TABS
600.0000 mg | ORAL_TABLET | Freq: Four times a day (QID) | ORAL | Status: DC
  Administered 2012-03-04 – 2012-03-05 (×4): 600 mg via ORAL
  Filled 2012-03-04 (×4): qty 1

## 2012-03-04 MED ORDER — BENZOCAINE-MENTHOL 20-0.5 % EX AERO
1.0000 "application " | INHALATION_SPRAY | CUTANEOUS | Status: DC | PRN
  Administered 2012-03-04: 1 via TOPICAL
  Filled 2012-03-04: qty 56

## 2012-03-04 MED ORDER — DIPHENHYDRAMINE HCL 25 MG PO CAPS: 25.0000 mg | ORAL_CAPSULE | Freq: Four times a day (QID) | ORAL | Status: DC | PRN

## 2012-03-04 MED ORDER — BUTORPHANOL TARTRATE 1 MG/ML IJ SOLN: 1.0000 mg | INTRAMUSCULAR | Status: DC | PRN

## 2012-03-04 MED ORDER — OXYCODONE-ACETAMINOPHEN 5-325 MG PO TABS
1.0000 | ORAL_TABLET | ORAL | Status: DC | PRN
  Administered 2012-03-04 – 2012-03-05 (×4): 2 via ORAL
  Filled 2012-03-04 (×4): qty 2

## 2012-03-04 MED ORDER — ONDANSETRON HCL 4 MG PO TABS: 4.0000 mg | ORAL_TABLET | ORAL | Status: DC | PRN

## 2012-03-04 MED ORDER — BUTORPHANOL TARTRATE 1 MG/ML IJ SOLN
2.0000 mg | Freq: Once | INTRAMUSCULAR | Status: AC
  Administered 2012-03-04: 2 mg via INTRAVENOUS
  Filled 2012-03-04: qty 2

## 2012-03-04 MED ORDER — ONDANSETRON HCL 4 MG/2ML IJ SOLN: 4.0000 mg | INTRAMUSCULAR | Status: DC | PRN

## 2012-03-04 MED ORDER — PRENATAL MULTIVITAMIN CH: 1.0000 | ORAL_TABLET | Freq: Every day | ORAL | Status: DC

## 2012-03-04 MED ORDER — WITCH HAZEL-GLYCERIN EX PADS: 1.0000 "application " | MEDICATED_PAD | CUTANEOUS | Status: DC | PRN

## 2012-03-04 MED ORDER — SIMETHICONE 80 MG PO CHEW: 80.0000 mg | CHEWABLE_TABLET | ORAL | Status: DC | PRN

## 2012-03-04 MED ORDER — ZOLPIDEM TARTRATE 5 MG PO TABS: 5.0000 mg | ORAL_TABLET | Freq: Every evening | ORAL | Status: DC | PRN

## 2012-03-04 MED ORDER — LANOLIN HYDROUS EX OINT: TOPICAL_OINTMENT | CUTANEOUS | Status: DC | PRN

## 2012-03-04 MED ORDER — TETANUS-DIPHTH-ACELL PERTUSSIS 5-2.5-18.5 LF-MCG/0.5 IM SUSP
0.5000 mL | Freq: Once | INTRAMUSCULAR | Status: AC
  Administered 2012-03-04: 0.5 mL via INTRAMUSCULAR
  Filled 2012-03-04: qty 0.5

## 2012-03-04 MED ORDER — SENNOSIDES-DOCUSATE SODIUM 8.6-50 MG PO TABS
2.0000 | ORAL_TABLET | Freq: Every day | ORAL | Status: DC
  Administered 2012-03-04: 2 via ORAL

## 2012-03-04 MED ORDER — DIBUCAINE 1 % RE OINT: 1.0000 "application " | TOPICAL_OINTMENT | RECTAL | Status: DC | PRN

## 2012-03-04 NOTE — Progress Notes (Signed)
Dannae Kato is a 32 y.o. [id number] at [redacted]w[redacted]d admitted for induction of labor due to Hypertension.  Subjective: Patient sleeping comfortably.  Objective: BP 122/67  Pulse 86  Temp 97.6 F (36.4 C) (Oral)  Resp 20  Ht 5\' 2"  (1.575 m)  Wt 122.018 kg (269 lb)  BMI 49.20 kg/m2  LMP 05/15/2011  Breastfeeding? Unknown      FHT:  FHR: 135 bpm, variability: moderate,  accelerations:  Present,  decelerations:  Absent UC:   irregular,  SVE:   Dilation: 4 Effacement (%): 60 Station: -3 Exam by:: Drenda Freeze C. cnm  Labs: Lab Results  Component Value Date   WBC 12.5* 03/03/2012   HGB 10.5* 03/03/2012   HCT 32.5* 03/03/2012   MCV 81.3 03/03/2012   PLT 352 03/03/2012    Assessment / Plan: Protracted latent phase  Labor: Not progressing at this point, will continue to increase pitocin, then AROM Fetal Wellbeing:  Category I Pain Control:  Fentanyl Anticipated MOD:  NSVD  Zafiro Routson 03/04/2012, 1:18 AM

## 2012-03-04 NOTE — Progress Notes (Signed)
UR completed 

## 2012-03-05 MED ORDER — IBUPROFEN 600 MG PO TABS: 600.0000 mg | ORAL_TABLET | Freq: Four times a day (QID) | ORAL | Status: DC

## 2012-03-05 MED ORDER — OXYCODONE-ACETAMINOPHEN 5-325 MG PO TABS: 1.0000 | ORAL_TABLET | ORAL | Status: DC | PRN

## 2012-03-05 NOTE — Discharge Summary (Signed)
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 She had 6 mos of chemo but never completed XRT due to pregnancy. She reported arm and leg clots during that time and was on lovenox and coumadin. However, patient has been seen by MFM who reviewed her notes from [place]. It appears that the patient had a Porta-Cath blood clot. MFM's assessment was that she did not need Lovenox during pregnancy. She is seen in high risk clinic. Her pregnancy has been uncomplicated other than elevated blood pressures in the third trimester. Patient admitted for labor induction for gestational hypertension with preeclampsia labs pending.   Physical Exam:  General: alert, cooperative and mild distress Lochia: appropriate Uterine Fundus: firm DVT Evaluation: No evidence of DVT seen on physical exam.  Discharge Diagnoses: Term Pregnancy-delivered  Discharge Information: Date: 03/05/2012 Activity: pelvic rest Diet: routine Medications: PNV, Ibuprofen and Percocet Condition: stable Instructions: refer to practice specific booklet Discharge to: home Follow-up Information    Follow up with [facility]. (Keep your postpartum appointment for 4-6 weeks)    Contact information:   [address] [place] [address]          Newborn Data: Live born female  Birth Weight: 8 lb 14.7 oz (4045 g) APGAR: 9, 9  Home with mother. Breast and bottle feeding. Desires Depo for  contraception but wants to wait until milk is established to get inj (will do at 6wk PP visit).  Cam Hai 03/05/2012, 7:35 AM

## 2012-03-07 ENCOUNTER — Telehealth: Payer: Self-pay | Admitting: *Deleted

## 2012-03-07 ENCOUNTER — Encounter (HOSPITAL_COMMUNITY): Payer: Self-pay | Admitting: *Deleted

## 2012-03-07 ENCOUNTER — Emergency Department (HOSPITAL_COMMUNITY)
Admission: EM | Admit: 2012-03-07 | Discharge: 2012-03-08 | Disposition: A | Discharge status: Home / Self Care | Payer: Self-pay | Attending: Emergency Medicine | Admitting: Emergency Medicine

## 2012-03-07 ENCOUNTER — Emergency Department (HOSPITAL_COMMUNITY): Payer: Self-pay

## 2012-03-07 DIAGNOSIS — Z86718 Personal history of other venous thrombosis and embolism: Secondary | ICD-10-CM | POA: Insufficient documentation

## 2012-03-07 DIAGNOSIS — K644 Residual hemorrhoidal skin tags: Secondary | ICD-10-CM | POA: Insufficient documentation

## 2012-03-07 DIAGNOSIS — Z8249 Family history of ischemic heart disease and other diseases of the circulatory system: Secondary | ICD-10-CM | POA: Insufficient documentation

## 2012-03-07 DIAGNOSIS — Z8679 Personal history of other diseases of the circulatory system: Secondary | ICD-10-CM | POA: Insufficient documentation

## 2012-03-07 DIAGNOSIS — O9279 Other disorders of lactation: Secondary | ICD-10-CM | POA: Insufficient documentation

## 2012-03-07 DIAGNOSIS — Z87440 Personal history of urinary (tract) infections: Secondary | ICD-10-CM | POA: Insufficient documentation

## 2012-03-07 DIAGNOSIS — Z8572 Personal history of non-Hodgkin lymphomas: Secondary | ICD-10-CM | POA: Insufficient documentation

## 2012-03-07 DIAGNOSIS — R109 Unspecified abdominal pain: Secondary | ICD-10-CM | POA: Insufficient documentation

## 2012-03-07 DIAGNOSIS — R1084 Generalized abdominal pain: Secondary | ICD-10-CM

## 2012-03-07 DIAGNOSIS — F17200 Nicotine dependence, unspecified, uncomplicated: Secondary | ICD-10-CM | POA: Insufficient documentation

## 2012-03-07 DIAGNOSIS — R1110 Vomiting, unspecified: Secondary | ICD-10-CM | POA: Insufficient documentation

## 2012-03-07 DIAGNOSIS — Z862 Personal history of diseases of the blood and blood-forming organs and certain disorders involving the immune mechanism: Secondary | ICD-10-CM | POA: Insufficient documentation

## 2012-03-07 DIAGNOSIS — K649 Unspecified hemorrhoids: Secondary | ICD-10-CM

## 2012-03-07 HISTORY — DX: Encounter for other specified aftercare: Z51.89

## 2012-03-07 LAB — CBC WITH DIFFERENTIAL/PLATELET
Basophils Absolute: 0 10*3/uL (ref 0.0–0.1)
Lymphocytes Relative: 21 % (ref 12–46)
Lymphs Abs: 2.5 10*3/uL (ref 0.7–4.0)
Neutrophils Relative %: 72 % (ref 43–77)
Platelets: 402 10*3/uL — ABNORMAL HIGH (ref 150–400)
RBC: 3.83 MIL/uL — ABNORMAL LOW (ref 3.87–5.11)
WBC: 11.8 10*3/uL — ABNORMAL HIGH (ref 4.0–10.5)

## 2012-03-07 LAB — TYPE AND SCREEN
ABO/RH(D): A POS
Unit division: 0

## 2012-03-07 LAB — URINALYSIS, ROUTINE W REFLEX MICROSCOPIC
Glucose, UA: NEGATIVE mg/dL
Specific Gravity, Urine: 1.018 (ref 1.005–1.030)
Urobilinogen, UA: 0.2 mg/dL (ref 0.0–1.0)

## 2012-03-07 LAB — URINE MICROSCOPIC-ADD ON

## 2012-03-07 LAB — BASIC METABOLIC PANEL
CO2: 22 mEq/L (ref 19–32)
GFR calc non Af Amer: >90 mL/min (ref >90)
Glucose, Bld: 76 mg/dL (ref 70–99)
Potassium: 3.5 mEq/L (ref 3.5–5.1)
Sodium: 138 mEq/L (ref 135–145)

## 2012-03-07 MED ORDER — ONDANSETRON 8 MG PO TBDP
8.0000 mg | ORAL_TABLET | Freq: Once | ORAL | Status: AC
  Administered 2012-03-07: 8 mg via ORAL
  Filled 2012-03-07: qty 1

## 2012-03-07 MED ORDER — SODIUM CHLORIDE 0.9 % IV SOLN
INTRAVENOUS | Status: DC
  Administered 2012-03-07: via INTRAVENOUS

## 2012-03-07 MED ORDER — KETOROLAC TROMETHAMINE 30 MG/ML IJ SOLN
30.0000 mg | Freq: Once | INTRAMUSCULAR | Status: AC
  Administered 2012-03-07: 30 mg via INTRAVENOUS
  Filled 2012-03-07: qty 1

## 2012-03-07 MED ORDER — HYDROCORTISONE ACE-PRAMOXINE 1-1 % RE FOAM: 1.0000 | Freq: Two times a day (BID) | RECTAL | Status: DC

## 2012-03-07 MED ORDER — LIDOCAINE HCL 2 % EX GEL
Freq: Once | CUTANEOUS | Status: AC
  Administered 2012-03-07: 10 via TOPICAL
  Filled 2012-03-07: qty 10

## 2012-03-07 MED ORDER — OXYCODONE-ACETAMINOPHEN 5-325 MG PO TABS
2.0000 | ORAL_TABLET | Freq: Once | ORAL | Status: AC
  Administered 2012-03-07: 2 via ORAL
  Filled 2012-03-07: qty 2

## 2012-03-07 NOTE — ED Notes (Signed)
Post-partum x 24 days- states she has been having rectal pain- has tried tucks and prep-h without relief- also c/o leg swelling and hurting

## 2012-03-07 NOTE — Telephone Encounter (Signed)
Patient left a message stating that she is having some problems since her delivery.

## 2012-03-07 NOTE — ED Provider Notes (Signed)
History     CSN: [id number]  Arrival date & time 03/07/12  2036   First MD Initiated Contact with Patient 03/07/12 2128      Chief Complaint  Patient presents with  . Rectal Pain  . Leg Swelling    (Consider location/radiation/quality/duration/timing/severity/associated sxs/prior treatment) HPI Comments: Cynthia Scott is a 32 y.o. G75P6 female w hx of non hodgkin's lymphoma presents to the ER s/p recent vaginal delivery with multiple complaints.  Delivery occurred at [facility]U 4 days ago after needing to be induced for gestational HTN. Delivery was uncomplicated however pt began developing abdominal pain 2 days ago. Pt reports "it feels like I am still having contractions." The pain has been associated with emesis x2, but pt denies any fevers night sweats or chills. In addition pt reports bilateral breast pain & redness stating that her child will not breast feed. She also reports bilateral leg pain and swelling that began yesterday. Pt denies CP, SOB, unilateral leg pain, cough hemoptysis, palpitations or DOE.   The history is provided by the patient and medical records.    Past Medical History  Diagnosis Date  . Blood transfusion 01/25/2009  . Urinary tract infection   . 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 Anemia   . Non-Hodgkin lymphoma   . 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 Cancer   . Non Hodgkin's lymphoma 2012  . DVT (deep venous thrombosis)     clots 2010 were in left leg  . Hodgkins lymphoma     pt sttes she was supposed to get treatment but showed up pregnant  . Obesity complicating pregnancy in third trimester   . Pregnancy induced hypertension   . Blood transfusion without reported diagnosis     Past Surgical History  Procedure Date  . Rhinoplasty   . Port-a-cath removal   . Chest wall tumor excision   . Tonsillectomy   . Wisdom tooth extraction 2010  . Appendectomy   . Cholecystectomy   . Tubes in ears   . Turmor in chest     Family History  Problem  Relation Age of Onset  . Anesthesia problems Neg Hx   . Heart disease Mother   . COPD Mother   . Diabetes Mother   . Heart disease Maternal Aunt   . Diabetes Maternal Aunt   . Cancer Maternal Uncle   . Heart disease Maternal Grandmother     History  Substance Use Topics  . Smoking status: Current Every Day Smoker -- 0.2 packs/day for 13 years    Types: Cigarettes  . Smokeless tobacco: Never Used  . Alcohol Use: No    OB History    Grav Para Term Preterm Abortions TAB SAB Ect Mult Living   10 6 6  4  0 4   6      Review of Systems  All other systems reviewed and are negative.    Allergies  Review of patient's allergies indicates no known allergies.  Home Medications   Current Outpatient Rx  Name  Route  Sig  Dispense  Refill  . ACETAMINOPHEN 500 MG PO TABS   Oral   Take 1,000 mg by mouth every 6 (six) hours as needed. For pain.           BP 143/77  Pulse 96  Temp 98.9 F (37.2 C) (Oral)  Resp 16  SpO2 100%  Breastfeeding? No  Physical Exam  Nursing note and vitals reviewed. Constitutional: She is oriented to  person, place, and time. She appears well-developed and well-nourished. No distress.  HENT:  Head: Normocephalic and atraumatic.  Eyes: Conjunctivae normal and EOM are normal.  Neck: Normal range of motion.  Pulmonary/Chest: Effort normal.       Engorged tender breasts bilaterally. LCAB  Abdominal:       Soft obese abdomen with diffuse tenderness to palpation  Genitourinary:       Bi manual, exam chaperoned. Mild bleeding with mild uterine tenderness. No masses palpated.    2 external rectal hemorrhoids seen, no fissures or fistulas.   Musculoskeletal: Normal range of motion.  Neurological: She is alert and oriented to person, place, and time.  Skin: Skin is warm and dry. No rash noted. She is not diaphoretic.  Psychiatric: She has a normal mood and affect. Her behavior is normal.    ED Course  Procedures (including critical care  time)  Labs Reviewed  CBC WITH DIFFERENTIAL - Abnormal; Notable for the following:    WBC 11.8 (*)     RBC 3.83 (*)     Hemoglobin 10.0 (*)     HCT 30.8 (*)     RDW 19.8 (*)     Platelets 402 (*)     Neutro Abs 8.5 (*)     All other components within normal limits  BASIC METABOLIC PANEL - Abnormal; Notable for the following:    Creatinine, Ser 0.44 (*)     All other components within normal limits  URINALYSIS, ROUTINE W REFLEX MICROSCOPIC - Abnormal; Notable for the following:    APPearance CLOUDY (*)     Hgb urine dipstick LARGE (*)     Protein, ur 30 (*)     Leukocytes, UA LARGE (*)     All other components within normal limits  URINE MICROSCOPIC-ADD ON - Abnormal; Notable for the following:    Bacteria, UA FEW (*)     All other components within normal limits  URINE CULTURE   US Transvaginal Non-ob  03/08/2012  *RADIOLOGY REPORT*  Clinical Data: 32 year old female with pelvic pain - 3 days postpartum.  TRANSABDOMINAL AND TRANSVAGINAL ULTRASOUND OF PELVIS Technique:  Both transabdominal and transvaginal ultrasound examinations of the pelvis were performed. Transabdominal technique was performed for global imaging of the pelvis including uterus, ovaries, adnexal regions, and pelvic cul-de-sac.  It was necessary to proceed with endovaginal exam following the transabdominal exam to visualize the ovaries and endometrium.  Comparison:  None  Findings:  Uterus: The uterus is enlarged measuring 18.7 x 8.9 x 13.5 cm.  No focal uterine masses are identified.  Endometrium: The endometrium is homogeneous measuring 15 mm in greatest diameter.  Right ovary:  The right ovary is unremarkable measuring 4.4 x 1.5 x 2.1 cm.  Left ovary: The left ovary is unremarkable measuring 4.2 x 1.3 x 2.4 cm.  Other findings: There is no evidence of free fluid or adnexal mass.  IMPRESSION: Enlarged uterus compatible with postpartum state.  No other abnormalities identified.   Original Report Authenticated By: Harmon Pier, M.D.    US Pelvis Complete  03/08/2012  *RADIOLOGY REPORT*  Clinical Data: 32 year old female with pelvic pain - 3 days postpartum.  TRANSABDOMINAL AND TRANSVAGINAL ULTRASOUND OF PELVIS Technique:  Both transabdominal and transvaginal ultrasound examinations of the pelvis were performed. Transabdominal technique was performed for global imaging of the pelvis including uterus, ovaries, adnexal regions, and pelvic cul-de-sac.  It was necessary to proceed with endovaginal exam following the transabdominal exam to visualize the ovaries and endometrium.  Comparison:  None  Findings:  Uterus: The uterus is enlarged measuring 18.7 x 8.9 x 13.5 cm.  No focal uterine masses are identified.  Endometrium: The endometrium is homogeneous measuring 15 mm in greatest diameter.  Right ovary:  The right ovary is unremarkable measuring 4.4 x 1.5 x 2.1 cm.  Left ovary: The left ovary is unremarkable measuring 4.2 x 1.3 x 2.4 cm.  Other findings: There is no evidence of free fluid or adnexal mass.  IMPRESSION: Enlarged uterus compatible with postpartum state.  No other abnormalities identified.   Original Report Authenticated By: Harmon Pier, M.D.      No diagnosis found.    MDM  32 year old female presents emergency department complaining of rectal pain, diffuse abdominal tenderness and breast pain 4 days postpartum.  Labs and imaging reviewed.  Patient is been advised to pump breasts as her child is not breast feeding.  Transvaginal ultrasound showed enlarged uterus compatible with postpartum state, but no abnormalities.  Patient's pain management emergency department.  Advised sitz bath for rectal hemorrhoids and will DC with stool softeners, Anusol, and lidocaine jelly.  Patient advised to followup with her OB/GYN.  Strict return precautions discussed in regards to breast pain and abdominal pain.  She verbalizes understanding and is agreeable with plan to discharge.  At this time there does not appear to be any  evidence of an acute emergency medical condition and the patient appears stable for discharge with appropriate outpatient follow up.Diagnosis was discussed with patient who verbalizes understanding and is agreeable to discharge. Pt case discussed with Dr. Patria Mane  who agrees with my plan.          Jaci Carrel, [place] 03/08/12 [phone]

## 2012-03-07 NOTE — ED Notes (Addendum)
Patient had a baby two days ago and is now experiencing rectal pain, abdominal pain, leg swelling, breast pain.  Patient is alert and oriented x 3.  Family at bedside.  Patient has hemorrhoids

## 2012-03-07 NOTE — Telephone Encounter (Signed)
Called Cynthia Scott and she states she has had 6 children and never had hemorrhoids- but does now and they are very painful.  She  Had a vaginal delivery 03/04/12. States she is using Tucks pads, preparation H ointment externally.  We discussed using preparation H suppositories also, ice packs if needed, avoiding constipation and sitz bath. . She denies constipation at present, but states she has been holding back  Because she was afraid of the pain. We discussed not to hold back , but to keep regular , drink a lot of water, stool softner if she becomes constipated.  Cynthia Scott states she has a high pain tolerance and she wants  A refill of her percocet for the pain from her hemorrhoids- states the extra strength tylenol and motrin 600mg  do not help at all and the hemorrhoid pain is a 10.  Informed her I would discuss with a doctor and call her back.  Disussed with Dr. Debroah Loop and orders given for proctofoam HC only, no other pain meds.  Called Cynthia Scott and informed her of proctofoam and may use preparation H as well- advised her to discuss with pharmacist- also avoid constipation, tucks pads. Also advised her to call back if this does not help. Cynthia Scott voices understanding.

## 2012-03-08 MED ORDER — HYDROCORTISONE 2.5 % RE CREA: TOPICAL_CREAM | RECTAL | Status: DC

## 2012-03-08 MED ORDER — MORPHINE SULFATE 4 MG/ML IJ SOLN
4.0000 mg | Freq: Once | INTRAMUSCULAR | Status: AC
  Administered 2012-03-08: 4 mg via INTRAVENOUS
  Filled 2012-03-08: qty 1

## 2012-03-08 MED ORDER — HYDROCODONE-ACETAMINOPHEN 5-325 MG PO TABS: 1.0000 | ORAL_TABLET | Freq: Four times a day (QID) | ORAL | Status: DC | PRN

## 2012-03-08 MED ORDER — DOCUSATE SODIUM 100 MG PO CAPS: 100.0000 mg | ORAL_CAPSULE | Freq: Two times a day (BID) | ORAL | Status: DC

## 2012-03-08 NOTE — ED Notes (Signed)
In room Ultrasound.

## 2012-03-08 NOTE — ED Provider Notes (Signed)
Medical screening examination/treatment/procedure(s) were performed by non-physician practitioner and as supervising physician I was immediately available for consultation/collaboration.   Lyanne Co, MD 03/08/12 0201

## 2012-03-08 NOTE — ED Notes (Signed)
US at bedside

## 2012-03-09 LAB — URINE CULTURE: Colony Count: >=100000

## 2012-03-16 ENCOUNTER — Telehealth: Payer: Self-pay | Admitting: General Practice

## 2012-03-16 NOTE — Telephone Encounter (Signed)
Patient called and stated she has been having a lot of problems since her pregnancy and wants a call back. Called patient back and she stated she's in so much pain from her hemorrhoids that she can hardly get out of bed and that she has tried every over the counter product and nothing helps and that she is taking tylenol like crazy. Spoke to Wynelle Bourgeois and she said the patient could try Proctofoam Hc and if the patient is not feeling any better by Friday to have the patient set up for a surgical consult for removal. Told patient of these instructions. Patient verbalized understanding and had no further questions

## 2012-04-07 ENCOUNTER — Ambulatory Visit (INDEPENDENT_AMBULATORY_CARE_PROVIDER_SITE_OTHER): Payer: Self-pay | Admitting: Obstetrics & Gynecology

## 2012-04-07 ENCOUNTER — Encounter: Payer: Self-pay | Admitting: Obstetrics & Gynecology

## 2012-04-07 VITALS — BP 121/70 | HR 92 | Temp 97.8°F | Ht 62.0 in | Wt 257.6 lb

## 2012-04-07 DIAGNOSIS — K649 Unspecified hemorrhoids: Secondary | ICD-10-CM

## 2012-04-07 DIAGNOSIS — Z3009 Encounter for other general counseling and advice on contraception: Secondary | ICD-10-CM

## 2012-04-07 MED ORDER — NORGESTIM-ETH ESTRAD TRIPHASIC 0.18/0.215/0.25 MG-35 MCG PO TABS
1.0000 | ORAL_TABLET | Freq: Every day | ORAL | Status: DC
Start: 2012-04-07 — End: 2012-08-17

## 2012-04-07 NOTE — Progress Notes (Unsigned)
Patient ID: Cynthia Scott, female   DOB: 14-Jul-1980, 32 y.o.   MRN: [id number] Subjective:     Cynthia Scott is a 32 y.o. female who presents for a postpartum visit. She is 6 week postpartum following a spontaneous vaginal delivery. I have fully reviewed the prenatal and intrapartum course. The delivery was at 39.6 gestational weeks. Outcome: spontaneous vaginal delivery. Anesthesia: epidural. Postpartum course has been complicated by hemorrhoids. Baby's course has been good. Baby is feeding by breast. Bleeding no bleeding. Bowel function is normal. Bladder function is normal. Patient is not sexually active. Contraception method is OCP (estrogen/progesterone). Postpartum depression screening: positive.  The following portions of the patient's history were reviewed and updated as appropriate: allergies, current medications, past family history, past medical history, past social history, past surgical history and problem list.  Review of Systems Gastrointestinal: positive for painful hemorrhoids   Objective:    BP 121/70  Pulse 92  Temp(Src) 97.8 F (36.6 C) (Oral)  Ht 5\' 2"  (1.575 m)  Wt 257 lb 9.6 oz (116.847 kg)  BMI 47.1 kg/m2  Breastfeeding? No  General:  alert and no distress   Breasts:     Lungs:    Heart:     Abdomen: soft, non-tender; bowel sounds normal; no masses,  no organomegaly   Vulva:  not evaluated  Vagina: not evaluated  Cervix:     Corpus: normal  Adnexa:  not evaluated  Rectal Exam: Not performed.        Assessment:     6 week postpartum exam. Pap smear not done at today's visit.   Plan:    1. Contraception: OCP (estrogen/progesterone) 2. Considering BTL 3. Follow up as needed.  4. Genl surgery referral  ARNOLD,JAMES 04/07/2012 1:57 PM

## 2012-04-08 ENCOUNTER — Encounter: Payer: Self-pay | Admitting: *Deleted

## 2012-04-08 DIAGNOSIS — O0990 Supervision of high risk pregnancy, unspecified, unspecified trimester: Secondary | ICD-10-CM

## 2012-04-22 ENCOUNTER — Ambulatory Visit (INDEPENDENT_AMBULATORY_CARE_PROVIDER_SITE_OTHER): Payer: Self-pay | Admitting: General Surgery

## 2012-05-04 ENCOUNTER — Ambulatory Visit (INDEPENDENT_AMBULATORY_CARE_PROVIDER_SITE_OTHER): Payer: Self-pay | Admitting: General Surgery

## 2012-05-24 ENCOUNTER — Ambulatory Visit (INDEPENDENT_AMBULATORY_CARE_PROVIDER_SITE_OTHER): Payer: Self-pay | Admitting: General Surgery

## 2012-06-15 ENCOUNTER — Encounter (INDEPENDENT_AMBULATORY_CARE_PROVIDER_SITE_OTHER): Payer: Self-pay | Admitting: General Surgery

## 2012-08-17 ENCOUNTER — Emergency Department (HOSPITAL_COMMUNITY): Payer: Medicaid Other

## 2012-08-17 ENCOUNTER — Emergency Department (HOSPITAL_COMMUNITY)
Admission: EM | Admit: 2012-08-17 | Discharge: 2012-08-17 | Disposition: A | Discharge status: Home / Self Care | Payer: Medicaid Other | Attending: Emergency Medicine | Admitting: Emergency Medicine

## 2012-08-17 ENCOUNTER — Encounter (HOSPITAL_COMMUNITY): Payer: Self-pay | Admitting: Emergency Medicine

## 2012-08-17 DIAGNOSIS — Z8589 Personal history of malignant neoplasm of other organs and systems: Secondary | ICD-10-CM | POA: Insufficient documentation

## 2012-08-17 DIAGNOSIS — R600 Localized edema: Secondary | ICD-10-CM

## 2012-08-17 DIAGNOSIS — Z8571 Personal history of Hodgkin lymphoma: Secondary | ICD-10-CM | POA: Insufficient documentation

## 2012-08-17 DIAGNOSIS — Z86718 Personal history of other venous thrombosis and embolism: Secondary | ICD-10-CM | POA: Insufficient documentation

## 2012-08-17 DIAGNOSIS — M7989 Other specified soft tissue disorders: Secondary | ICD-10-CM | POA: Insufficient documentation

## 2012-08-17 DIAGNOSIS — R197 Diarrhea, unspecified: Secondary | ICD-10-CM | POA: Insufficient documentation

## 2012-08-17 DIAGNOSIS — R0789 Other chest pain: Secondary | ICD-10-CM | POA: Insufficient documentation

## 2012-08-17 DIAGNOSIS — R0602 Shortness of breath: Secondary | ICD-10-CM | POA: Insufficient documentation

## 2012-08-17 DIAGNOSIS — Z8742 Personal history of other diseases of the female genital tract: Secondary | ICD-10-CM | POA: Insufficient documentation

## 2012-08-17 DIAGNOSIS — Z8572 Personal history of non-Hodgkin lymphomas: Secondary | ICD-10-CM | POA: Insufficient documentation

## 2012-08-17 DIAGNOSIS — R011 Cardiac murmur, unspecified: Secondary | ICD-10-CM | POA: Insufficient documentation

## 2012-08-17 DIAGNOSIS — F17200 Nicotine dependence, unspecified, uncomplicated: Secondary | ICD-10-CM | POA: Insufficient documentation

## 2012-08-17 DIAGNOSIS — Z87440 Personal history of urinary (tract) infections: Secondary | ICD-10-CM | POA: Insufficient documentation

## 2012-08-17 DIAGNOSIS — Z862 Personal history of diseases of the blood and blood-forming organs and certain disorders involving the immune mechanism: Secondary | ICD-10-CM | POA: Insufficient documentation

## 2012-08-17 DIAGNOSIS — M549 Dorsalgia, unspecified: Secondary | ICD-10-CM | POA: Insufficient documentation

## 2012-08-17 LAB — CBC
MCHC: 31.2 g/dL (ref 30.0–36.0)
MCV: 77.4 fL — ABNORMAL LOW (ref 78.0–100.0)
Platelets: 349 10*3/uL (ref 150–400)
RDW: 17.3 % — ABNORMAL HIGH (ref 11.5–15.5)
WBC: 10.2 10*3/uL (ref 4.0–10.5)

## 2012-08-17 LAB — BASIC METABOLIC PANEL
Chloride: 102 mEq/L (ref 96–112)
Creatinine, Ser: 0.52 mg/dL (ref 0.50–1.10)
GFR calc Af Amer: >90 mL/min (ref >90)
GFR calc non Af Amer: >90 mL/min (ref >90)

## 2012-08-17 LAB — PRO B NATRIURETIC PEPTIDE: Pro B Natriuretic peptide (BNP): 85.7 pg/mL (ref 0–125)

## 2012-08-17 MED ORDER — FENTANYL CITRATE 0.05 MG/ML IJ SOLN
50.0000 ug | INTRAMUSCULAR | Status: DC | PRN
  Administered 2012-08-17: 50 ug via INTRAVENOUS
  Filled 2012-08-17: qty 2

## 2012-08-17 MED ORDER — SODIUM CHLORIDE 0.9 % IV BOLUS (SEPSIS)
1000.0000 mL | Freq: Once | INTRAVENOUS | Status: AC
  Administered 2012-08-17: 1000 mL via INTRAVENOUS

## 2012-08-17 MED ORDER — IOHEXOL 350 MG/ML SOLN
100.0000 mL | Freq: Once | INTRAVENOUS | Status: AC | PRN
  Administered 2012-08-17: 100 mL via INTRAVENOUS

## 2012-08-17 MED ORDER — ONDANSETRON HCL 4 MG/2ML IJ SOLN
4.0000 mg | Freq: Once | INTRAMUSCULAR | Status: AC
  Administered 2012-08-17: 4 mg via INTRAVENOUS
  Filled 2012-08-17: qty 2

## 2012-08-17 MED ORDER — OXYCODONE-ACETAMINOPHEN 5-325 MG PO TABS: 1.0000 | ORAL_TABLET | Freq: Three times a day (TID) | ORAL | Status: DC | PRN

## 2012-08-17 MED ORDER — FENTANYL CITRATE 0.05 MG/ML IJ SOLN
100.0000 ug | Freq: Once | INTRAMUSCULAR | Status: AC
  Administered 2012-08-17: 100 ug via INTRAVENOUS
  Filled 2012-08-17: qty 2

## 2012-08-17 MED ORDER — ONDANSETRON HCL 4 MG PO TABS: 4.0000 mg | ORAL_TABLET | Freq: Three times a day (TID) | ORAL | Status: DC | PRN

## 2012-08-17 NOTE — Progress Notes (Signed)
[facility] CL has seen patient and provided her with a list of primary care resources. °

## 2012-08-17 NOTE — ED Notes (Signed)
Pt c/o chest pain x 1 week with SOB and dizziness. Also states legs have been swelling up. Pt states she recently stopped taking chemo for lymphoma  due to pregnancy and has not started back

## 2012-08-17 NOTE — Progress Notes (Signed)
Patient reports she is Medicaid pending.  [facility] rep has seen patient in ED and provided information for the orange card.  [facility] provided patient a list of providers who accept Medicaid insurance.  Also provided discount prescription card and informed patient about discount pharmacies such as [facility], [facility] and [facility].  Also informed patient on the financial resources in the community such as [employer].  Patient thankful for information

## 2012-08-17 NOTE — ED Provider Notes (Signed)
History    CSN: [id number] Arrival date & time 08/17/12  1341  First MD Initiated Contact with Patient 08/17/12 1458     Chief Complaint  Patient presents with  . cancer pt, chest pain/ back pain    (Consider location/radiation/quality/duration/timing/severity/associated sxs/prior Treatment) HPI Comments: 32 y.o. Morbidly obese Female 5 months postpartum with hx of non hodgkin's lymphoma and DVT during pregnancy first pregnancy (2010) presents today with chest pain, shortness of breath, and bilateral lower leg edema that has been occuring for the better part of a year (correction from nursing note). Pt is not anti-coagulated as she states she "quit everything," including her coumadin/lovanox treatment, her chemo treatment, and following up with any doctors. She states she has not seen a doctor or taken any meds on a regular basis for at least three years (with the exception of her recent pregnancy). She has no outpt medical care at this time.   Chest pain and shortness of breath have momentarily resolved.   Pt describes the chest pain as intermittent, occurring mostly at night laying down, described as a squeezing pressure, 10/10 when it occurs, sometimes radiates down her left arm, sometimes radiates around to the thoracic portion of her back. Non reproducible. Admits diarrhea which has occurred intermittently over several months. Denies fevers, cough, hemoptysis, nausea, vomiting, diaphoresis, abdominal pain, dysuria, hematuria.   Past Medical History  Diagnosis Date  . Blood transfusion 01/25/2009  . Urinary tract infection   . 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 Anemia   . Non-Hodgkin lymphoma   . 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 Cancer   . Non Hodgkin's lymphoma 2012  . DVT (deep venous thrombosis)     clots 2010 were in left leg  . Hodgkins lymphoma     pt sttes she was supposed to get treatment but showed up pregnant  . Obesity complicating pregnancy in third trimester   .  Pregnancy induced hypertension   . Blood transfusion without reported diagnosis    Past Surgical History  Procedure Laterality Date  . Rhinoplasty    . Port-a-cath removal    . Chest wall tumor excision    . Tonsillectomy    . Wisdom tooth extraction  2010  . Appendectomy    . Cholecystectomy    . Tubes in ears    . Turmor in chest     Family History  Problem Relation Age of Onset  . Anesthesia problems Neg Hx   . Heart disease Mother   . COPD Mother   . Diabetes Mother   . Heart disease Maternal Aunt   . Diabetes Maternal Aunt   . Cancer Maternal Uncle   . Heart disease Maternal Grandmother    History  Substance Use Topics  . Smoking status: Current Every Day Smoker -- 0.25 packs/day for 13 years    Types: Cigarettes  . Smokeless tobacco: Never Used  . Alcohol Use: No   OB History   Grav Para Term Preterm Abortions TAB SAB Ect Mult Living   10 6 6  4  0 4   6     Review of Systems  Constitutional: Negative for fever and diaphoresis.  HENT: Negative for neck pain and neck stiffness.   Eyes: Negative for visual disturbance.  Respiratory: Positive for shortness of breath. Negative for apnea, cough and chest tightness.   Cardiovascular: Positive for chest pain and leg swelling. Negative for palpitations.       Squeezing, central, intermittent  Gastrointestinal: Positive for diarrhea. Negative for nausea, vomiting, abdominal pain and constipation.  Genitourinary: Negative for dysuria, hematuria, flank pain, vaginal bleeding and pelvic pain.  Musculoskeletal: Positive for back pain. Negative for gait problem.       Intermittent pain to thoracic back  Skin: Negative for rash.  Neurological: Negative for dizziness, weakness, light-headedness, numbness and headaches.    Allergies  Review of patient's allergies indicates no known allergies.  Home Medications   Current Outpatient Rx  Name  Route  Sig  Dispense  Refill  . acetaminophen (TYLENOL) 500 MG tablet   Oral    Take 500 mg by mouth every 6 (six) hours as needed for pain.          BP 107/47  Temp(Src) 98.5 F (36.9 C) (Oral)  Resp 17  SpO2 99%  Breastfeeding? No Physical Exam  Nursing note and vitals reviewed. Constitutional: She is oriented to person, place, and time. She appears well-developed and well-nourished. No distress.  Morbidly obese  HENT:  Head: Normocephalic and atraumatic.  Eyes: Conjunctivae and EOM are normal.  Neck: Normal range of motion. Neck supple.  No meningeal signs  Cardiovascular: Normal rate, regular rhythm, normal heart sounds and intact distal pulses.  Exam reveals no gallop and no friction rub.   No murmur heard. Pulmonary/Chest: Effort normal and breath sounds normal. No respiratory distress. She has no wheezes. She has no rales. She exhibits no tenderness.  Abdominal: Soft. Bowel sounds are normal. She exhibits no distension. There is no tenderness. There is no rebound and no guarding.  Musculoskeletal: Normal range of motion. She exhibits no edema and no tenderness.  FROM to upper and lower extremities No step-offs noted on C-spine No tenderness to palpation of the spinous processes of the C-spine, T-spine or L-spine Full range of motion of C-spine, T-spine,  L-spine   Neurological: She is alert and oriented to person, place, and time. No cranial nerve deficit.  Speech is clear and goal oriented, follows commands Sensation normal to light touch and two point discrimination Moves extremities without ataxia, coordination intact Normal gait and balance Normal strength in upper and lower extremities bilaterally including dorsiflexion and plantar flexion, strong and equal grip strength   Skin: Skin is warm and dry. She is not diaphoretic. No erythema.    ED Course  Procedures (including critical care time)   Date: 08/17/2012  Rate: 72  Rhythm: normal sinus rhythm  QRS Axis: normal  Intervals: normal  ST/T Wave abnormalities: normal  Conduction  Disutrbances:none  Narrative Interpretation: normal EKG  Old EKG Reviewed: none available 05/17/11  Medications  fentaNYL (SUBLIMAZE) injection 50 mcg (50 mcg Intravenous Given 08/17/12 1546)  sodium chloride 0.9 % bolus 1,000 mL (0 mLs Intravenous Stopped 08/17/12 1900)  ondansetron (ZOFRAN) injection 4 mg (4 mg Intravenous Given 08/17/12 1546)  iohexol (OMNIPAQUE) 350 MG/ML injection 100 mL (100 mLs Intravenous Contrast Given 08/17/12 1615)  fentaNYL (SUBLIMAZE) injection 100 mcg (100 mcg Intravenous Given 08/17/12 1900)    Labs Reviewed  CBC - Abnormal; Notable for the following:    Hemoglobin 10.8 (*)    HCT 34.6 (*)    MCV 77.4 (*)    MCH 24.2 (*)    RDW 17.3 (*)    All other components within normal limits  BASIC METABOLIC PANEL  PRO B NATRIURETIC PEPTIDE  TROPONIN I   Dg Chest 2 View  08/17/2012   *RADIOLOGY REPORT*  Clinical Data: Chest pain, history non-Hodgkins lymphoma, smoking  CHEST -  2 VIEW  Comparison: None  Findings: Normal heart size, mediastinal contours, and pulmonary vascularity. Minimal peribronchial thickening. No acute infiltrate, pleural effusion or pneumothorax. Biconvex thoracolumbar scoliosis.  IMPRESSION: Minimal bronchitic changes without acute infiltrate.   Original Report Authenticated By: Ulyses Southward, M.D.   Ct Angio Chest Pe W/cm &/or Wo Cm  08/17/2012   *RADIOLOGY REPORT*  Clinical Data: Chest pain and short of breath  CT ANGIOGRAPHY CHEST  Technique:  Multidetector CT imaging of the chest using the standard protocol during bolus administration of intravenous contrast. Multiplanar reconstructed images including MIPs were obtained and reviewed to evaluate the vascular anatomy.  Contrast: OMNIPAQUE IOHEXOL 350 MG/ML SOLN  Comparison: chest 08/17/2012  Findings: Negative for pulmonary embolism.  Negative for aortic dissection.  The lungs are clear.  Negative for pneumonia or effusion.  No mass or adenopathy.  Negative for pericardial effusion.  IMPRESSION: Negative    Original Report Authenticated By: Janeece Riggers, M.D.   1. Chest pain, non-cardiac   2. Bilateral edema of lower extremity    Filed Vitals:   08/17/12 1837 08/17/12 1842 08/17/12 1844 08/17/12 1909  BP: 101/52 102/56 104/49 100/39  Pulse: 70 85 84 77  Temp: 97.9 F (36.6 C)     TempSrc: Oral     Resp: 20   16  SpO2: 96%   100%   Discharge Medication List as of 08/17/2012  6:54 PM    START taking these medications   Details  ondansetron (ZOFRAN) 4 MG tablet Take 1 tablet (4 mg total) by mouth every 8 (eight) hours as needed for nausea., Starting 08/17/2012, Until Discontinued, Print    oxyCODONE-acetaminophen (PERCOCET) 5-325 MG per tablet Take 1 tablet by mouth every 8 (eight) hours as needed for pain., Starting 08/17/2012, Until Discontinued, Print        MDM  Low suspicion for ACS, pneumothorax, pnuemonia, aortic dissection.  Want to r/o for PE given pt hx of DVT, obesity, sedentary lifestyle, h/o cancer. Dr. Theodoro Kalata saw pt as well. CT angio ordered. Will re-evaluate.   Potassium was hemolyzed. Will send another to the lab. Pt is feeling better on re-evaluation. Pain has been managed. Discussed results of CT angio which was negative and specifically for any mass that the pt feared was growing and pressing on her heart or for PE.   Pt has been asymptomatically hypotensive in the ED. Orthostatics ok. Able to ambulate well around the ED without symptoms. Discussed with Dr. Theodoro Kalata who agrees that with unconcerning labs (second K was normal), normal EKG, negative troponin, nothing concerning on imaging, that pt was stable for discharge. Will discharge home with pain and nausea meds, oncology follow up, and resource guide for primary care. Impressed upon pt the importance of outpt follow up as part of her care plan and establishing herself as a pt with both an oncologist and a primary care doctor. Discussed reasons to seek immediate care. Patient expresses understanding and agrees with  plan.    Glade Nurse, PA-C 08/17/12 1941

## 2012-08-17 NOTE — ED Provider Notes (Signed)
Medical screening examination/treatment/procedure(s) were performed by non-physician practitioner and as supervising physician I was immediately available for consultation/collaboration.  Miasha Emmons, MD 08/17/12 2349 

## 2012-12-05 ENCOUNTER — Encounter: Payer: Self-pay | Admitting: Obstetrics & Gynecology

## 2012-12-08 ENCOUNTER — Telehealth: Payer: Self-pay | Admitting: Hematology and Oncology

## 2012-12-08 NOTE — Telephone Encounter (Signed)
S/w Dr. Lerry Liner and gve np appt 11/12 @ 9:30 w/Dr. Bertis Ruddy. Per MD pt was seen in [facility] records have been requested Dx- Lymphoma Welcome packet mailed.

## 2012-12-19 ENCOUNTER — Telehealth: Payer: Self-pay | Admitting: Hematology and Oncology

## 2012-12-19 NOTE — Telephone Encounter (Signed)
C/D 12/19/12 for appt. 12/21/12

## 2012-12-21 ENCOUNTER — Telehealth: Payer: Self-pay | Admitting: Hematology and Oncology

## 2012-12-21 ENCOUNTER — Encounter: Payer: Self-pay | Admitting: Hematology and Oncology

## 2012-12-21 ENCOUNTER — Ambulatory Visit: Payer: Medicaid Other

## 2012-12-21 ENCOUNTER — Ambulatory Visit (HOSPITAL_BASED_OUTPATIENT_CLINIC_OR_DEPARTMENT_OTHER): Payer: Medicaid Other | Admitting: Hematology and Oncology

## 2012-12-21 ENCOUNTER — Ambulatory Visit (HOSPITAL_BASED_OUTPATIENT_CLINIC_OR_DEPARTMENT_OTHER): Payer: Medicaid Other | Admitting: Lab

## 2012-12-21 VITALS — BP 113/67 | HR 81 | Temp 98.0°F | Resp 18 | Ht 62.0 in | Wt 283.1 lb

## 2012-12-21 DIAGNOSIS — Z8571 Personal history of Hodgkin lymphoma: Secondary | ICD-10-CM | POA: Insufficient documentation

## 2012-12-21 DIAGNOSIS — Z91199 Patient's noncompliance with other medical treatment and regimen due to unspecified reason: Secondary | ICD-10-CM

## 2012-12-21 DIAGNOSIS — R635 Abnormal weight gain: Secondary | ICD-10-CM

## 2012-12-21 DIAGNOSIS — Z9119 Patient's noncompliance with other medical treatment and regimen: Secondary | ICD-10-CM

## 2012-12-21 DIAGNOSIS — F17200 Nicotine dependence, unspecified, uncomplicated: Secondary | ICD-10-CM

## 2012-12-21 DIAGNOSIS — R300 Dysuria: Secondary | ICD-10-CM

## 2012-12-21 DIAGNOSIS — C8190 Hodgkin lymphoma, unspecified, unspecified site: Secondary | ICD-10-CM

## 2012-12-21 DIAGNOSIS — M549 Dorsalgia, unspecified: Secondary | ICD-10-CM

## 2012-12-21 HISTORY — DX: Abnormal weight gain: R63.5

## 2012-12-21 LAB — CBC WITH DIFFERENTIAL/PLATELET
Basophils Absolute: 0 10*3/uL (ref 0.0–0.1)
EOS%: 2.2 % (ref 0.0–7.0)
HGB: 11.2 g/dL — ABNORMAL LOW (ref 11.6–15.9)
MCH: 24.3 pg — ABNORMAL LOW (ref 25.1–34.0)
MCV: 76.3 fL — ABNORMAL LOW (ref 79.5–101.0)
MONO%: 6.1 % (ref 0.0–14.0)
RBC: 4.61 10*6/uL (ref 3.70–5.45)
RDW: 17.8 % — ABNORMAL HIGH (ref 11.2–14.5)
WBC: 9.1 10*3/uL (ref 3.9–10.3)

## 2012-12-21 LAB — COMPREHENSIVE METABOLIC PANEL (CC13)
AST: 19 U/L (ref 5–34)
Albumin: 3.9 g/dL (ref 3.5–5.0)
Alkaline Phosphatase: 79 U/L (ref 40–150)
Anion Gap: 10 mEq/L (ref 3–11)
BUN: 13.9 mg/dL (ref 7.0–26.0)
CO2: 20 mEq/L — ABNORMAL LOW (ref 22–29)
Creatinine: 0.6 mg/dL (ref 0.6–1.1)
Glucose: 88 mg/dl (ref 70–140)
Potassium: 3.9 mEq/L (ref 3.5–5.1)
Total Bilirubin: 0.42 mg/dL (ref 0.20–1.20)

## 2012-12-21 LAB — URINALYSIS, MICROSCOPIC - CHCC
Bilirubin (Urine): NEGATIVE
Glucose: NEGATIVE mg/dL
Ketones: NEGATIVE mg/dL
Specific Gravity, Urine: 1.02 (ref 1.003–1.035)

## 2012-12-21 LAB — TSH CHCC: TSH: 0.846 m(IU)/L (ref 0.308–3.960)

## 2012-12-21 NOTE — Progress Notes (Signed)
Checked in new pt with no financial concerns. °

## 2012-12-21 NOTE — Progress Notes (Signed)
[facility] CONSULT NOTE  Patient has no care team.  CHIEF COMPLAINTS/PURPOSE OF CONSULTATION:  History of nodular sclerosing Hodgkin lymphoma, stage IIB  HISTORY OF PRESENTING ILLNESS:  Cynthia Scott 32 y.o. female is here because of history of Hodgkin lymphoma.I reviewed almost 60 pages of outside records today and collaborated history with the patient  Previously, the patient was under the care of Dr. Launa Flight at [facility]. The patient presented with a non-resolving cough, weight loss, and some fever. According to her, she was in the prison at that time. The cough is not resolving with antibiotic therapy. She had a chest x-ray and chest CT scan which show a anterior superior mediastinal mass measured 10.5x9.3 cm, encasing great vessels. Original biopsy was nondiagnostic. On 11/19/2008, she had mediastinoscopy and biopsy and she was diagnosed with nodular sclerosing Hodgkin lymphoma, stage IIB due to the presence of B. symptoms. Pretreatment echocardiogram was normal. In addition to the mediastinal mass, she was in the cervical lymphadenopathy, subcarinal lymphadenopathy giving the final stage IIB. Between and of 2010 to 2011, she received 6 cycles of ABVD chemotherapy with dosage adjustment. After 6 cycles of treatment, PET CT scan show improved disease control with no FDG uptake. The patient was recommended consideration for involved field radiation therapy however had been noncompliance and was subsequently lost to followup. Last appointment at [facility] was in 07/29/2011. The patient had multiple pregnancies after that, with one of her child complicated by severe anemia due to maternal alloimmunization and the requiring  NICU care. Currently the baby is doing well. She has no oncology followup almost a year.  Her current complaints is urinary frequency and dysuria. She denies any recent fever, chills, night sweats or abnormal weight loss She  also has excessive weight gain. She also complained of severe back pain for many months. She also complained of diffuse tenderness in both axillary region although none of those areas were ever involved by cancer MEDICAL HISTORY:  Past Medical History  Diagnosis Date  . Blood transfusion 01/25/2009  . Urinary tract infection   . 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 Anemia   . Non-Hodgkin lymphoma   . 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 Cancer   . Non Hodgkin's lymphoma 2012  . DVT (deep venous thrombosis)     clots 2010 were in left leg  . Hodgkins lymphoma     pt sttes she was supposed to get treatment but showed up pregnant  . Obesity complicating pregnancy in third trimester   . Pregnancy induced hypertension   . Blood transfusion without reported diagnosis   . Weight gain 12/21/2012    SURGICAL HISTORY: Past Surgical History  Procedure Laterality Date  . Rhinoplasty    . Port-a-cath removal    . Chest wall tumor excision    . Tonsillectomy    . Wisdom tooth extraction  2010  . Appendectomy    . Cholecystectomy    . Tubes in ears    . Turmor in chest      SOCIAL HISTORY: History   Social History  . Marital Status: Married    Spouse Name: N/A    Number of Children: N/A  . Years of Education: N/A   Occupational History  . Not on file.   Social History Main Topics  . Smoking status: Current Every Day Smoker -- 1.00 packs/day for 13 years    Types: Cigarettes  . Smokeless tobacco: Never  Used  . Alcohol Use: No  . Drug Use: No  . Sexual Activity: Yes    Birth Control/ Protection: None, Other-see comments     Comment: post partum   Other Topics Concern  . Not on file   Social History Narrative   ** Merged History Encounter **        FAMILY HISTORY: Family History  Problem Relation Age of Onset  . Anesthesia problems Neg Hx   . Heart disease Mother   . COPD Mother   . Diabetes Mother   . Heart disease Maternal Aunt   . Diabetes Maternal Aunt   .  Cancer Maternal Uncle     colon cancer  . Heart disease Maternal Grandmother     ALLERGIES:  has No Known Allergies.  MEDICATIONS:  Current Outpatient Prescriptions  Medication Sig Dispense Refill  . acetaminophen (TYLENOL) 500 MG tablet Take 500 mg by mouth every 6 (six) hours as needed for pain.      . citalopram (CELEXA) 10 MG tablet Take 10 mg by mouth daily.      Marland Kitchen gabapentin (NEURONTIN) 100 MG capsule Take 100 mg by mouth 3 (three) times daily.      Marland Kitchen HYDROcodone-acetaminophen (VICODIN) 5-500 MG per tablet Take 1 tablet by mouth every 6 (six) hours as needed for pain.      Marland Kitchen ondansetron (ZOFRAN) 4 MG tablet Take 1 tablet (4 mg total) by mouth every 8 (eight) hours as needed for nausea.  10 tablet  0  . oxyCODONE-acetaminophen (PERCOCET) 5-325 MG per tablet Take 1 tablet by mouth every 8 (eight) hours as needed for pain.  12 tablet  0   No current facility-administered medications for this visit.    REVIEW OF SYSTEMS:   Constitutional: Denies fevers, chills or abnormal night sweats Eyes: Denies blurriness of vision, double vision or watery eyes Ears, nose, mouth, throat, and face: Denies mucositis or sore throat Respiratory: Denies cough, dyspnea or wheezes Cardiovascular: Denies palpitation, chest discomfort or lower extremity swelling Gastrointestinal:  Denies nausea, heartburn or change in bowel habits Skin: Denies abnormal skin rashes Lymphatics: Denies new lymphadenopathy or easy bruising Neurological:Denies numbness, tingling or new weaknesses Behavioral/Psych: Mood is stable, no new changes  All other systems were reviewed with the patient and are negative.  PHYSICAL EXAMINATION: ECOG PERFORMANCE STATUS: 1 - Symptomatic but completely ambulatory  Filed Vitals:   12/21/12 1007  BP: 113/67  Pulse: 81  Temp: 98 F (36.7 C)  Resp: 18   Filed Weights   12/21/12 1007  Weight: 283 lb 1.6 oz (128.413 kg)    GENERAL:alert, no distress and comfortable. She is  morbidly obese SKIN: skin color, texture, turgor are normal, no rashes or significant lesions EYES: normal, conjunctiva are pink and non-injected, sclera clear OROPHARYNX:no exudate, no erythema and lips, buccal mucosa, and tongue normal  NECK: supple, thyroid normal size, non-tender, without nodularity LYMPH:  no palpable lymphadenopathy in the cervical, axillary or inguinal LUNGS: clear to auscultation and percussion with normal breathing effort HEART: regular rate & rhythm and no murmurs and no lower extremity edema ABDOMEN:abdomen soft, non-tender and normal bowel sounds Musculoskeletal:no cyanosis of digits and no clubbing  PSYCH: alert & oriented x 3 with fluent speech NEURO: no focal motor/sensory deficits  LABORATORY DATA:  I have reviewed the data as listed   ASSESSMENT:  #1 history of Hodgkin lymphoma #2 dysuria #3 chronic back pain #4 noncompliance  PLAN:  #1 history of Hodgkin lymphoma #2 dysuria #3  chronic back pain #4 noncompliance This is a young 32 year old lady with background history of Hodgkin lymphoma and was treated elsewhere. She was recommended involved field radiation therapy but was noncompliant to treatment recommendation and subsequently was lost to followup. I recommend repeating blood work and imaging study to restage her disease. For the dysuria I will check urinalysis and urine culture and if they came back positive for I will treat her urinary tract infection Her chronic back pain is likely due to morbid obesity. She had an MRI done at [facility] who showed no evidence of disease. I recommend over-the-counter analgesics and vitamin D supplements. The patient is 2also smoker. I discussed with her about the importance of nicotine cessation but she's not interested to quit right now. I will see her back in 3 weeks to review test results. Orders Placed This Encounter  Procedures  . Urine culture    Standing Status: Future     Number of  Occurrences:      Standing Expiration Date: 12/21/2013  . CT Chest W Contrast    Standing Status: Future     Number of Occurrences:      Standing Expiration Date: 12/21/2013    Order Specific Question:  Reason for exam:    Answer:  staging lymphoma    Order Specific Question:  Is the patient pregnant?    Answer:  No    Order Specific Question:  Preferred imaging location?    Answer:  [facility]  . CT Abdomen Pelvis W Contrast    Standing Status: Future     Number of Occurrences:      Standing Expiration Date: 12/21/2013    Order Specific Question:  Reason for exam:    Answer:  staging lymphoma    Order Specific Question:  Is the patient pregnant?    Answer:  No    Order Specific Question:  Preferred imaging location?    Answer:  [facility]  . CT Soft Tissue Neck W Contrast    Standing Status: Future     Number of Occurrences:      Standing Expiration Date: 12/21/2013    Order Specific Question:  Reason for Exam (SYMPTOM  OR DIAGNOSIS REQUIRED)    Answer:  staging lymphoma    Order Specific Question:  Is the patient pregnant?    Answer:  No    Order Specific Question:  Preferred imaging location?    Answer:  [facility]  . CBC with Differential    Standing Status: Future     Number of Occurrences:      Standing Expiration Date: 09/12/2013  . Comprehensive metabolic panel    Standing Status: Future     Number of Occurrences:      Standing Expiration Date: 12/21/2013  . Lactate dehydrogenase    Standing Status: Future     Number of Occurrences:      Standing Expiration Date: 12/21/2013  . Urinalysis, Microscopic - [facility]    Standing Status: Future     Number of Occurrences:      Standing Expiration Date: 12/21/2013  . Vitamin D 25 hydroxy    Standing Status: Future     Number of Occurrences:      Standing Expiration Date: 12/21/2013  . hCG, quantitative, pregnancy    Standing Status: Future     Number of Occurrences:      Standing  Expiration Date: 12/21/2013  . TSH    Standing Status: Future  Number of Occurrences:      Standing Expiration Date: 12/21/2013  . T4, free    Standing Status: Future     Number of Occurrences:      Standing Expiration Date: 12/21/2013    All questions were answered. The patient knows to call the clinic with any problems, questions or concerns.    Aztlan Coll, MD 12/21/2012 10:40 AM

## 2012-12-21 NOTE — Telephone Encounter (Signed)
worked 11/12 POF appts made AVS and cal given to pt shh

## 2012-12-22 ENCOUNTER — Other Ambulatory Visit: Payer: Self-pay | Admitting: Hematology and Oncology

## 2012-12-22 ENCOUNTER — Telehealth: Payer: Self-pay | Admitting: Hematology and Oncology

## 2012-12-22 ENCOUNTER — Telehealth: Payer: Self-pay | Admitting: *Deleted

## 2012-12-22 ENCOUNTER — Ambulatory Visit (INDEPENDENT_AMBULATORY_CARE_PROVIDER_SITE_OTHER): Payer: Medicaid Other | Admitting: Obstetrics & Gynecology

## 2012-12-22 DIAGNOSIS — O0990 Supervision of high risk pregnancy, unspecified, unspecified trimester: Secondary | ICD-10-CM

## 2012-12-22 DIAGNOSIS — N390 Urinary tract infection, site not specified: Secondary | ICD-10-CM | POA: Insufficient documentation

## 2012-12-22 DIAGNOSIS — Z3201 Encounter for pregnancy test, result positive: Secondary | ICD-10-CM

## 2012-12-22 DIAGNOSIS — O0991 Supervision of high risk pregnancy, unspecified, first trimester: Secondary | ICD-10-CM

## 2012-12-22 LAB — OB RESULTS CONSOLE RUBELLA ANTIBODY, IGM: RUBELLA: IMMUNE

## 2012-12-22 LAB — HIV ANTIBODY (ROUTINE TESTING W REFLEX): HIV: NONREACTIVE

## 2012-12-22 LAB — URINE CULTURE

## 2012-12-22 LAB — VITAMIN D 25 HYDROXY (VIT D DEFICIENCY, FRACTURES): Vit D, 25-Hydroxy: 46 ng/mL (ref 30–89)

## 2012-12-22 MED ORDER — AMOXICILLIN 500 MG PO TABS: 500.0000 mg | ORAL_TABLET | Freq: Two times a day (BID) | ORAL | Status: DC

## 2012-12-22 NOTE — Progress Notes (Signed)
Review test result with the patient over the phone. Pregnancy test came back positive. I will cancel her CT scan and to further evaluation by her gynecologist. Her urinalysis came back abnormal and I will call in prescription of amoxicillin to her pharmacy.

## 2012-12-22 NOTE — Progress Notes (Signed)
Pulse-  Pain-lower back pain x 3 months Weight gain 11-20lbs New ob packet given Early glucose give due @ 1552

## 2012-12-22 NOTE — Telephone Encounter (Signed)
Pt returning Dr. Maxine Glenn call.  She can be reached at ph [phone].

## 2012-12-22 NOTE — Telephone Encounter (Signed)
sw Tiffany in CT per Dr Bertis Ruddy staff mess dted 11/13 canceling CT bc pt pregnant shh

## 2012-12-23 ENCOUNTER — Encounter: Payer: Self-pay | Admitting: Obstetrics & Gynecology

## 2012-12-23 DIAGNOSIS — O09899 Supervision of other high risk pregnancies, unspecified trimester: Secondary | ICD-10-CM | POA: Insufficient documentation

## 2012-12-23 DIAGNOSIS — Z6841 Body Mass Index (BMI) 40.0 and over, adult: Secondary | ICD-10-CM

## 2012-12-23 LAB — OBSTETRIC PANEL
Antibody Screen: NEGATIVE
Basophils Absolute: 0 10*3/uL (ref 0.0–0.1)
Basophils Relative: 0 % (ref 0–1)
Eosinophils Absolute: 0.2 10*3/uL (ref 0.0–0.7)
Eosinophils Relative: 2 % (ref 0–5)
Hemoglobin: 10.3 g/dL — ABNORMAL LOW (ref 12.0–15.0)
Lymphocytes Relative: 25 % (ref 12–46)
MCH: 24.8 pg — ABNORMAL LOW (ref 26.0–34.0)
MCHC: 32.5 g/dL (ref 30.0–36.0)
MCV: 76.2 fL — ABNORMAL LOW (ref 78.0–100.0)
Monocytes Relative: 6 % (ref 3–12)
Neutro Abs: 6.7 10*3/uL (ref 1.7–7.7)
Platelets: 355 10*3/uL (ref 150–400)
RDW: 17.3 % — ABNORMAL HIGH (ref 11.5–15.5)
Rh Type: POSITIVE
WBC: 10 10*3/uL (ref 4.0–10.5)

## 2012-12-23 LAB — PRESCRIPTION MONITORING PROFILE (19 PANEL)
Amphetamine/Meth: NEGATIVE ng/mL
Cannabinoid Scrn, Ur: NEGATIVE ng/mL
Carisoprodol, Urine: NEGATIVE ng/mL
Creatinine, Urine: 134.78 mg/dL (ref >20.0)
MDMA URINE: NEGATIVE ng/mL
Meperidine, Ur: NEGATIVE ng/mL
Methadone Screen, Urine: NEGATIVE ng/mL
Nitrites, Initial: NEGATIVE ug/mL
Opiate Screen, Urine: NEGATIVE ng/mL
Oxycodone Screen, Ur: NEGATIVE ng/mL
Phencyclidine, Ur: NEGATIVE ng/mL
Propoxyphene: NEGATIVE ng/mL
Tapentadol, urine: NEGATIVE ng/mL
pH, Initial: 5.7 pH (ref 4.5–8.9)

## 2012-12-24 LAB — CULTURE, OB URINE: Organism ID, Bacteria: >=100000

## 2012-12-28 ENCOUNTER — Ambulatory Visit (HOSPITAL_COMMUNITY)
Admission: RE | Admit: 2012-12-28 | Discharge: 2012-12-28 | Disposition: A | Discharge status: Home / Self Care | Payer: Medicaid Other | Source: Ambulatory Visit | Attending: Obstetrics & Gynecology | Admitting: Obstetrics & Gynecology

## 2012-12-28 DIAGNOSIS — Z3689 Encounter for other specified antenatal screening: Secondary | ICD-10-CM | POA: Insufficient documentation

## 2012-12-28 DIAGNOSIS — O361911 Maternal care for other isoimmunization, first trimester, fetus 1: Secondary | ICD-10-CM

## 2012-12-28 DIAGNOSIS — O09891 Supervision of other high risk pregnancies, first trimester: Secondary | ICD-10-CM

## 2012-12-28 DIAGNOSIS — O0941 Supervision of pregnancy with grand multiparity, first trimester: Secondary | ICD-10-CM

## 2012-12-28 DIAGNOSIS — O99331 Smoking (tobacco) complicating pregnancy, first trimester: Secondary | ICD-10-CM

## 2012-12-28 DIAGNOSIS — O0990 Supervision of high risk pregnancy, unspecified, unspecified trimester: Secondary | ICD-10-CM

## 2012-12-28 DIAGNOSIS — Z3201 Encounter for pregnancy test, result positive: Secondary | ICD-10-CM

## 2012-12-28 DIAGNOSIS — O209 Hemorrhage in early pregnancy, unspecified: Secondary | ICD-10-CM | POA: Insufficient documentation

## 2012-12-29 ENCOUNTER — Other Ambulatory Visit (HOSPITAL_COMMUNITY): Payer: Medicaid Other

## 2013-01-03 ENCOUNTER — Emergency Department (HOSPITAL_COMMUNITY): Payer: Medicaid Other

## 2013-01-03 ENCOUNTER — Encounter (HOSPITAL_COMMUNITY): Payer: Self-pay | Admitting: Emergency Medicine

## 2013-01-03 ENCOUNTER — Emergency Department (HOSPITAL_COMMUNITY)
Admission: EM | Admit: 2013-01-03 | Discharge: 2013-01-03 | Disposition: A | Discharge status: Home / Self Care | Payer: Medicaid Other | Attending: Emergency Medicine | Admitting: Emergency Medicine

## 2013-01-03 DIAGNOSIS — Z862 Personal history of diseases of the blood and blood-forming organs and certain disorders involving the immune mechanism: Secondary | ICD-10-CM | POA: Insufficient documentation

## 2013-01-03 DIAGNOSIS — M542 Cervicalgia: Secondary | ICD-10-CM | POA: Insufficient documentation

## 2013-01-03 DIAGNOSIS — O2390 Unspecified genitourinary tract infection in pregnancy, unspecified trimester: Secondary | ICD-10-CM | POA: Insufficient documentation

## 2013-01-03 DIAGNOSIS — E669 Obesity, unspecified: Secondary | ICD-10-CM | POA: Insufficient documentation

## 2013-01-03 DIAGNOSIS — M545 Low back pain, unspecified: Secondary | ICD-10-CM | POA: Insufficient documentation

## 2013-01-03 DIAGNOSIS — N390 Urinary tract infection, site not specified: Secondary | ICD-10-CM | POA: Insufficient documentation

## 2013-01-03 DIAGNOSIS — M549 Dorsalgia, unspecified: Secondary | ICD-10-CM

## 2013-01-03 DIAGNOSIS — Z792 Long term (current) use of antibiotics: Secondary | ICD-10-CM | POA: Insufficient documentation

## 2013-01-03 DIAGNOSIS — Z8571 Personal history of Hodgkin lymphoma: Secondary | ICD-10-CM | POA: Insufficient documentation

## 2013-01-03 DIAGNOSIS — Z86718 Personal history of other venous thrombosis and embolism: Secondary | ICD-10-CM | POA: Insufficient documentation

## 2013-01-03 DIAGNOSIS — O99330 Smoking (tobacco) complicating pregnancy, unspecified trimester: Secondary | ICD-10-CM | POA: Insufficient documentation

## 2013-01-03 DIAGNOSIS — Z8572 Personal history of non-Hodgkin lymphomas: Secondary | ICD-10-CM | POA: Insufficient documentation

## 2013-01-03 DIAGNOSIS — R011 Cardiac murmur, unspecified: Secondary | ICD-10-CM | POA: Insufficient documentation

## 2013-01-03 DIAGNOSIS — O99891 Other specified diseases and conditions complicating pregnancy: Secondary | ICD-10-CM | POA: Insufficient documentation

## 2013-01-03 LAB — CBC WITH DIFFERENTIAL/PLATELET
Basophils Absolute: 0 10*3/uL (ref 0.0–0.1)
Basophils Relative: 0 % (ref 0–1)
Eosinophils Absolute: 0.2 10*3/uL (ref 0.0–0.7)
Hemoglobin: 10.7 g/dL — ABNORMAL LOW (ref 12.0–15.0)
Lymphs Abs: 2.6 10*3/uL (ref 0.7–4.0)
MCH: 24.4 pg — ABNORMAL LOW (ref 26.0–34.0)
MCHC: 31.6 g/dL (ref 30.0–36.0)
MCV: 77.2 fL — ABNORMAL LOW (ref 78.0–100.0)
Monocytes Absolute: 0.7 10*3/uL (ref 0.1–1.0)
Monocytes Relative: 7 % (ref 3–12)
Neutrophils Relative %: 66 % (ref 43–77)
RDW: 17 % — ABNORMAL HIGH (ref 11.5–15.5)

## 2013-01-03 LAB — URINALYSIS, ROUTINE W REFLEX MICROSCOPIC
Glucose, UA: NEGATIVE mg/dL
Specific Gravity, Urine: 1.024 (ref 1.005–1.030)
pH: 6.5 (ref 5.0–8.0)

## 2013-01-03 LAB — COMPREHENSIVE METABOLIC PANEL
Albumin: 3.9 g/dL (ref 3.5–5.2)
Alkaline Phosphatase: 86 U/L (ref 39–117)
BUN: 9 mg/dL (ref 6–23)
Calcium: 9.3 mg/dL (ref 8.4–10.5)
Creatinine, Ser: 0.47 mg/dL — ABNORMAL LOW (ref 0.50–1.10)
Glucose, Bld: 86 mg/dL (ref 70–99)
Potassium: 3.7 mEq/L (ref 3.5–5.1)
Total Protein: 7.6 g/dL (ref 6.0–8.3)

## 2013-01-03 LAB — URINE MICROSCOPIC-ADD ON

## 2013-01-03 MED ORDER — OXYCODONE-ACETAMINOPHEN 5-325 MG PO TABS
2.0000 | ORAL_TABLET | Freq: Once | ORAL | Status: AC
  Administered 2013-01-03: 2 via ORAL
  Filled 2013-01-03: qty 2

## 2013-01-03 MED ORDER — OXYCODONE-ACETAMINOPHEN 5-325 MG PO TABS: 1.0000 | ORAL_TABLET | Freq: Four times a day (QID) | ORAL | Status: DC | PRN

## 2013-01-03 MED ORDER — MORPHINE SULFATE 4 MG/ML IJ SOLN
4.0000 mg | Freq: Once | INTRAMUSCULAR | Status: AC
  Administered 2013-01-03: 4 mg via INTRAVENOUS
  Filled 2013-01-03: qty 1

## 2013-01-03 MED ORDER — CEPHALEXIN 500 MG PO CAPS: 500.0000 mg | ORAL_CAPSULE | Freq: Two times a day (BID) | ORAL | Status: DC

## 2013-01-03 NOTE — ED Notes (Signed)
Per RN hold lab draw at this time. 

## 2013-01-03 NOTE — ED Notes (Signed)
MD at bedside. 

## 2013-01-03 NOTE — ED Notes (Signed)
Pt aware of the need for a urine sample. 

## 2013-01-03 NOTE — ED Provider Notes (Signed)
CSN: [id number]     Arrival date & time 01/03/13  1412 History   First MD Initiated Contact with Patient 01/03/13 1418     Chief Complaint  Patient presents with  . Back Pain    recurrent back pain, unresponsive to tylenol   (Consider location/radiation/quality/duration/timing/severity/associated sxs/prior Treatment) HPI   This is a 32 year old female G11 P6 with non-Hodgkin's lymphoma and currently pregnant at approximately 7 weeks and 5 days he presents with back pain.  Patient reports 2-3 month history of left-sided back pain. She states this her just under her left ear it is over her left musculature her back. She denies any injury. She states that she was seeing a Dr. Mayford Knife in [facility] but "he didn't do anything for me." She is currently only taking Tylenol because of her pregnancy. She states this is not helping. She states it is worse with movement. She also has pain in her left ear. She denies any recent fevers. She denies any weakness, numbness, or urinary retention. Patient does report bilateral hand and bilateral feet tingling.  Of note, patient was lost to followup regarding her non-Hodgkin's lymphoma. She recently followed up with oncology and that was when they found out she was pregnant. She has not had any oncologic scanning CT or PET for over a year. The extent of her disease is unknown at this time. Past Medical History  Diagnosis Date  . Blood transfusion 01/25/2009  . Urinary tract infection   . 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 Anemia   . Non-Hodgkin lymphoma   . 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 Cancer   . Non Hodgkin's lymphoma 2012  . DVT (deep venous thrombosis)     clots 2010 were in left leg  . Hodgkins lymphoma     pt sttes she was supposed to get treatment but showed up pregnant  . Obesity complicating pregnancy in third trimester   . Pregnancy induced hypertension   . Blood transfusion without reported diagnosis   . Weight gain 12/21/2012   Past  Surgical History  Procedure Laterality Date  . Rhinoplasty    . Port-a-cath removal    . Chest wall tumor excision    . Tonsillectomy    . Wisdom tooth extraction  2010  . Appendectomy    . Cholecystectomy    . Tubes in ears    . Turmor in chest     Family History  Problem Relation Age of Onset  . Anesthesia problems Neg Hx   . Heart disease Mother   . COPD Mother   . Diabetes Mother   . Heart disease Maternal Aunt   . Diabetes Maternal Aunt   . Cancer Maternal Uncle     colon cancer  . Heart disease Maternal Grandmother    History  Substance Use Topics  . Smoking status: Current Every Day Smoker -- 1.00 packs/day for 13 years    Types: Cigarettes  . Smokeless tobacco: Never Used  . Alcohol Use: No   OB History   Grav Para Term Preterm Abortions TAB SAB Ect Mult Living   11 6 6  4  0 4   6     Review of Systems  Constitutional: Negative for fever.  Respiratory: Negative for cough, chest tightness and shortness of breath.   Cardiovascular: Negative for chest pain.  Gastrointestinal: Negative for nausea, vomiting and abdominal pain.  Genitourinary: Negative for dysuria, urgency, vaginal bleeding and vaginal discharge.  Musculoskeletal: Positive for  back pain, neck pain and neck stiffness.  Skin: Negative for wound.  Neurological: Negative for headaches.  All other systems reviewed and are negative.    Allergies  Review of patient's allergies indicates no known allergies.  Home Medications   Current Outpatient Rx  Name  Route  Sig  Dispense  Refill  . acetaminophen (TYLENOL) 500 MG tablet   Oral   Take 500 mg by mouth every 6 (six) hours as needed for pain.         . cephALEXin (KEFLEX) 500 MG capsule   Oral   Take 1 capsule (500 mg total) by mouth 2 (two) times daily.   20 capsule   0   . oxyCODONE-acetaminophen (PERCOCET/ROXICET) 5-325 MG per tablet   Oral   Take 1-2 tablets by mouth every 6 (six) hours as needed for severe pain.   15 tablet   0     BP 124/63  Pulse 93  Temp(Src) 98.2 F (36.8 C) (Oral)  Resp 18  Wt 283 lb (128.368 kg)  SpO2 100% Physical Exam  Nursing note and vitals reviewed. Constitutional: She is oriented to person, place, and time.  Tearful, obese, no acute distress  HENT:  Head: Normocephalic and atraumatic.  Mouth/Throat: Oropharynx is clear and moist.  Neck: Normal range of motion.  Cardiovascular: Normal rate, regular rhythm and normal heart sounds.   No murmur heard. Pulmonary/Chest: Effort normal. No respiratory distress.  Abdominal: Soft. There is no tenderness.  Musculoskeletal: Normal range of motion. She exhibits no edema.  Tenderness palpation over the left paraspinous muscle of the cervical, thoracic, and lumbar spine, no masses noted. There is also tenderness to palpation over the midline of the cervical and thoracic spine.  Neurological: She is alert and oriented to person, place, and time. No cranial nerve deficit. Coordination normal.  5 out of 5 strength in all 4 extremities  Skin: Skin is warm and dry.  Psychiatric: She has a normal mood and affect.    ED Course  Procedures (including critical care time) Labs Review Labs Reviewed  CBC WITH DIFFERENTIAL - Abnormal; Notable for the following:    Hemoglobin 10.7 (*)    HCT 33.9 (*)    MCV 77.2 (*)    MCH 24.4 (*)    RDW 17.0 (*)    All other components within normal limits  COMPREHENSIVE METABOLIC PANEL - Abnormal; Notable for the following:    Sodium 132 (*)    Creatinine, Ser 0.47 (*)    Total Bilirubin 0.2 (*)    All other components within normal limits  URINALYSIS, ROUTINE W REFLEX MICROSCOPIC - Abnormal; Notable for the following:    APPearance CLOUDY (*)    Leukocytes, UA MODERATE (*)    All other components within normal limits  URINE MICROSCOPIC-ADD ON - Abnormal; Notable for the following:    Squamous Epithelial / LPF MANY (*)    Bacteria, UA MANY (*)    All other components within normal limits  URINE CULTURE    Imaging Review No results found.  EKG Interpretation   None       MDM   1. Back pain   2. Urinary tract infection    Patient presents with a 2-3 month history of left-sided back pain. She does have some midline tenderness. She is nontoxic-appearing on exam and vital signs are reassuring. She has no evidence of cauda equina. She does have a history of non-Hodgkin's lymphoma and the extent of her illness is unknown  as she has been lost to followup and has had multiple pregnancies that have prevented further workup.  Given that she does have some midline back pain and has cancer, will try to obtain an MRI of the C. and T-spine. Patient was given pain medication. Urinalysis was also sent. Patient was unable to fit in the MRI scanner. It is hard to know the source of the patient's back pain. She does have any evidence of UTI. At this time and do not have further resources to evaluate her spine, however given the duration of her symptoms and no evidence of cauda equina, I feel she is safe for discharge home and followup. She does have a primary doctor as well as an OB. She was given strict return precautions. She will be given a short course of pain medication as well as antibiotics for UTI.  After history, exam, and medical workup I feel the patient has been appropriately medically screened and is safe for discharge home. Pertinent diagnoses were discussed with the patient. Patient was given return precautions.     Shon Baton, MD 01/03/13 [phone]

## 2013-01-03 NOTE — ED Notes (Addendum)
Pt c/o of upper back pain with nonproductive cough " for awhile" but unable to say when it started. She reports that it hurts to take a deep breath. Her PCP did blood work and placed on her what she says is blood pressure meds and diabetes meds. She did not fill the prescriptions and states 'they found all that out from one tube of blood".  Pt is [redacted] weeks pregnant.

## 2013-01-03 NOTE — ED Notes (Signed)
RN to try [place] IV

## 2013-01-03 NOTE — ED Notes (Signed)
Pain in head, neck and low back x 3 months. Has difficulty with ROM of neck due to pain. Seen by oncologist on 12/24/2012. Current chemotherapy is on hold due to pregnancy.

## 2013-01-03 NOTE — ED Notes (Signed)
Pt unable to tolerate MRI. MD Horton notified.

## 2013-01-03 NOTE — ED Notes (Signed)
Pt transported to MRI will attempt labs and IV upon return

## 2013-01-03 NOTE — ED Notes (Signed)
Pt given apple juice to drink still unable to provide urine specimen

## 2013-01-03 NOTE — ED Notes (Signed)
Pt returned from MRI °

## 2013-01-05 LAB — URINE CULTURE

## 2013-01-09 ENCOUNTER — Telehealth: Payer: Self-pay | Admitting: *Deleted

## 2013-01-09 NOTE — Telephone Encounter (Signed)
Patient phoned registration desk asking for pain medication.  States it has nothing to do with current pregnancy.  Registrar came to my office to ask question for patient.  Informed registrar we cannot give pain medication.  Registrar explained to patient.  Patient asked if she might be able to get pain medication at office visit tomorrow.  Registrar encouraged patient to discuss with provider at visit.

## 2013-01-09 NOTE — Telephone Encounter (Signed)
Was asked to speak with patient by registrar.  Patient states current pregnancy is a risk to her life due to cancer diagnosis.  Is asking for a pill to terminate the pregnancy.  Patient states she was given a pill here previously.  Upon clarification patient was given medication after the start of miscarriage when she had visit to [facility] with previous pregnancy.  Explained to patient this is not an option with this current pregnancy.  Encouraged patient to keep current appointment for tomorrow with Dr. Jolayne Panther to discuss options.  Patient had questions regarding Medicaid coverage of termination.  Explained that Medicaid may pay due to life threatening situation to patient.  Made Dr. Jolayne Panther aware of visit 01/10/13 and situation.

## 2013-01-09 NOTE — Telephone Encounter (Signed)
Pt states has appt tomorrow w/ [facility].   She states was recently in ED and "someone" told her that her condition is "life threatening" enough to consider having abortion.  Pt is going to ask OB/GYN tomorrow about option of abortion or having a D & C.  Pt wants to proceed w/  scans and treatment if needed w/o risk to a pregnancy.   Pt asking if she needs to keep appt to see Dr. Bertis Ruddy on 12/03?  She is concerned it may conflict w/ any proposed procedures.   Instructed pt to keep appt for now, but to call us back tomorrow after she sees GYN.  We can r/s her appts here if needed.  Pt verbalized understanding and will call back tomorrow after she has a clearer idea of plan and schedule for her pregnancy.

## 2013-01-10 ENCOUNTER — Encounter: Payer: Medicaid Other | Admitting: Obstetrics and Gynecology

## 2013-01-10 ENCOUNTER — Telehealth: Payer: Self-pay | Admitting: *Deleted

## 2013-01-10 ENCOUNTER — Ambulatory Visit (INDEPENDENT_AMBULATORY_CARE_PROVIDER_SITE_OTHER): Payer: Medicaid Other | Admitting: Obstetrics and Gynecology

## 2013-01-10 ENCOUNTER — Encounter: Payer: Self-pay | Admitting: Obstetrics and Gynecology

## 2013-01-10 VITALS — BP 128/68 | HR 86 | Temp 97.5°F | Ht 62.0 in | Wt 280.1 lb

## 2013-01-10 DIAGNOSIS — Z331 Pregnant state, incidental: Secondary | ICD-10-CM

## 2013-01-10 DIAGNOSIS — Z3201 Encounter for pregnancy test, result positive: Secondary | ICD-10-CM

## 2013-01-10 LAB — POCT URINALYSIS DIP (DEVICE)
Bilirubin Urine: NEGATIVE
Hgb urine dipstick: NEGATIVE
Ketones, ur: NEGATIVE mg/dL
Urobilinogen, UA: 0.2 mg/dL (ref 0.0–1.0)
pH: 7 (ref 5.0–8.0)

## 2013-01-10 MED ORDER — OXYCODONE-ACETAMINOPHEN 5-325 MG PO TABS: 1.0000 | ORAL_TABLET | ORAL | Status: DC | PRN

## 2013-01-10 NOTE — Telephone Encounter (Signed)
Pt requests letter from Dr. Bertis Ruddy stating termination of pregnancy is medically necessary due to a life threatening condition.   Per Dr. Bertis Ruddy,  termination of pregnancy is not medically necessary.   Informed pt of this and suggested pt keep appt tomorrow w/ Dr. Bertis Ruddy as scheduled to discuss above.  Pt agreed and states will keep her appt to see Dr. Bertis Ruddy tomorrow.

## 2013-01-10 NOTE — Progress Notes (Signed)
Patient ID: Cynthia Scott, female   DOB: 1980-08-25, 32 y.o.   MRN: [id number] 32 yo [id number] with h/o Hodgkins lymphoma currently evaluated by cancer center for possible recurrence. Patient was ordinally scheduled for CT scan for possible staging but was incidentally noted to be pregnant on a pregnancy test. Patient presents today requesting termination of pregnancy stating that she was told that she has a life threatening condition. Upon chart review, I do not see any documentation stating that termination of pregnancy is medically recommended, nor that she was diagnosed with a life threatening condition. Patient is scheduled to follow up with Cancer center tomorrow. She is opting to go right now.   Discussed with patient that if medically indicated, the termination can be performed via D&E. Risks and benefits of the procedure were reviewed and explained to the patient. Patient verbalized understanding and all questions were answered. Informed patient that upon the receipt of documentation stating that the D&E is medically indicated, she should contact our office and the procedure will be scheduled for her.  Informed patient that if procedure is not medically indicated, termination of pregnancy is now considered an elective request. Elective termination of pregnancy are are not performed by members of this office staff at [facility] hospital. She is welcomed to contact [facility] in order to proceed. Patient verbalized understanding and is headed towards the cancer center right now.  A prescription refill of 20 tabs of percocet was provided at the patient's request.

## 2013-01-10 NOTE — Telephone Encounter (Signed)
Pt left VM requesting to speak to nurse.   Called back and left VM for pt to return call again or just keep her appt tomorrow as scheduled.

## 2013-01-11 ENCOUNTER — Telehealth: Payer: Self-pay | Admitting: Hematology and Oncology

## 2013-01-11 ENCOUNTER — Ambulatory Visit (HOSPITAL_BASED_OUTPATIENT_CLINIC_OR_DEPARTMENT_OTHER): Payer: Medicaid Other | Admitting: Hematology and Oncology

## 2013-01-11 ENCOUNTER — Encounter: Payer: Self-pay | Admitting: *Deleted

## 2013-01-11 ENCOUNTER — Other Ambulatory Visit: Payer: Medicaid Other | Admitting: Lab

## 2013-01-11 ENCOUNTER — Encounter: Payer: Self-pay | Admitting: Hematology and Oncology

## 2013-01-11 VITALS — BP 142/71 | HR 93 | Temp 98.1°F | Resp 18 | Ht 62.0 in | Wt 283.9 lb

## 2013-01-11 DIAGNOSIS — N390 Urinary tract infection, site not specified: Secondary | ICD-10-CM

## 2013-01-11 DIAGNOSIS — Z8571 Personal history of Hodgkin lymphoma: Secondary | ICD-10-CM

## 2013-01-11 DIAGNOSIS — IMO0001 Reserved for inherently not codable concepts without codable children: Secondary | ICD-10-CM

## 2013-01-11 DIAGNOSIS — R1310 Dysphagia, unspecified: Secondary | ICD-10-CM

## 2013-01-11 DIAGNOSIS — M899 Disorder of bone, unspecified: Secondary | ICD-10-CM

## 2013-01-11 DIAGNOSIS — D649 Anemia, unspecified: Secondary | ICD-10-CM | POA: Insufficient documentation

## 2013-01-11 DIAGNOSIS — D509 Iron deficiency anemia, unspecified: Secondary | ICD-10-CM

## 2013-01-11 DIAGNOSIS — C8190 Hodgkin lymphoma, unspecified, unspecified site: Secondary | ICD-10-CM

## 2013-01-11 HISTORY — DX: Anemia, unspecified: D64.9

## 2013-01-11 HISTORY — DX: Dysphagia, unspecified: R13.10

## 2013-01-11 NOTE — Progress Notes (Signed)
[facility] OFFICE PROGRESS NOTE  Patient Care Team: Artis Delay, MD as Consulting Physician (Hematology and Oncology) Artis Delay, MD as Consulting Physician (Hematology and Oncology)  DIAGNOSIS: History of Hodgkin lymphoma stage IIB  SUMMARY OF ONCOLOGIC HISTORY: Oncology History   Hodgkin's lymphoma   Primary site: Lymphoid Neoplasms (Bilateral)   Staging method: AJCC 6th Edition   Clinical free text: IIB   Clinical: Stage II signed by Artis Delay, MD on 12/21/2012 10:52 AM   Pathologic: Stage II signed by Artis Delay, MD on 12/21/2012 10:52 AM   Summary: Stage II      History of Hodgkins lymphoma   11/13/2008 Procedure FNA biopsy under US guidance was non-diagnostic but suspicious for cancer   11/13/2008 Imaging Large mass mediastinum 10.5 X 9.3 cm with leftf cervical lymphadenopathy measured 1.7 cm   11/14/2008 Procedure Placement of port   11/15/2008 Surgery She had repeat biopsy but was non-diagnostic   11/15/2008 Imaging PET/CT scan confirmed hypermetabolic mass in mediastinum and cervical region   11/19/2008 Procedure She had repeat biopsy 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, nodular sclerosing subtype   11/20/2008 Imaging US showed partially obstructed IJV partially extending to subclavian vein and right acute thrombus in the IJV adn cephalic vein    40/98/1191 Procedure Bone marrow aspirate and biopsy was negative   02/14/2009 Imaging PET/Ct scan after 3 cycles of chemo showed reduction in size of mediastinal cancer to 5.5 X 3.2 cm and reduced FDG uptake and resolution of other LN   05/13/2009 Imaging PEt/Ct showed reduction in the mediastinal mass to 2.5 cm with minimal residual activity   07/04/2009 Procedure Port was removed   08/17/2012 Imaging Ct angiogram of chest showed no evidence of disease recurrence    INTERVAL HISTORY: Cynthia Scott 32 y.o. female returns for further followup. I have to cancel all her imaging studies due to positive pregnancy  test. She still complaining of dysphagia. She is complaining of diffuse back pain. She is to have urinary frequency and dysuria.  I have reviewed the past medical history, past surgical history, social history and family history with the patient and they are unchanged from previous note.  ALLERGIES:  has No Known Allergies.  MEDICATIONS:  Current Outpatient Prescriptions  Medication Sig Dispense Refill  . acetaminophen (TYLENOL) 500 MG tablet Take 500 mg by mouth every 6 (six) hours as needed for pain.      Marland Kitchen oxyCODONE-acetaminophen (PERCOCET/ROXICET) 5-325 MG per tablet Take 1 tablet by mouth every 4 (four) hours as needed.  20 tablet  0   No current facility-administered medications for this visit.    REVIEW OF SYSTEMS:   All other systems were reviewed with the patient and are negative.  PHYSICAL EXAMINATION: ECOG PERFORMANCE STATUS: 1 - Symptomatic but completely ambulatory  Filed Vitals:   01/11/13 0939  BP: 142/71  Pulse: 93  Temp: 98.1 F (36.7 C)  Resp: 18   Filed Weights   01/11/13 0939  Weight: 283 lb 14.4 oz (128.776 kg)    GENERAL:alert, no distress and comfortable NEURO: alert & oriented x 3 with fluent speech, no focal motor/sensory deficits  LABORATORY DATA:  I have reviewed the data as listed    Component Value Date/Time   NA 132* 01/03/2013 1705   NA 136 12/21/2012 1055   K 3.7 01/03/2013 1705   K 3.9 12/21/2012 1055   CL 98 01/03/2013 1705   CO2 22 01/03/2013 1705   CO2 20* 12/21/2012 1055   GLUCOSE  86 01/03/2013 1705   GLUCOSE 88 12/21/2012 1055   BUN 9 01/03/2013 1705   BUN 13.9 12/21/2012 1055   CREATININE 0.47* 01/03/2013 1705   CREATININE 0.6 12/21/2012 1055   CALCIUM 9.3 01/03/2013 1705   CALCIUM 9.4 12/21/2012 1055   PROT 7.6 01/03/2013 1705   PROT 7.7 12/21/2012 1055   ALBUMIN 3.9 01/03/2013 1705   ALBUMIN 3.9 12/21/2012 1055   AST 19 01/03/2013 1705   AST 19 12/21/2012 1055   ALT 17 01/03/2013 1705   ALT 23 12/21/2012 1055    ALKPHOS 86 01/03/2013 1705   ALKPHOS 79 12/21/2012 1055   BILITOT 0.2* 01/03/2013 1705   BILITOT 0.42 12/21/2012 1055   GFRNONAA >90 01/03/2013 1705   GFRAA >90 01/03/2013 1705    No results found for this basename: SPEP, UPEP,  kappa and lambda light chains    Lab Results  Component Value Date   WBC 10.3 01/03/2013   NEUTROABS 6.8 01/03/2013   HGB 10.7* 01/03/2013   HCT 33.9* 01/03/2013   MCV 77.2* 01/03/2013   PLT 367 01/03/2013      Chemistry      Component Value Date/Time   NA 132* 01/03/2013 1705   NA 136 12/21/2012 1055   K 3.7 01/03/2013 1705   K 3.9 12/21/2012 1055   CL 98 01/03/2013 1705   CO2 22 01/03/2013 1705   CO2 20* 12/21/2012 1055   BUN 9 01/03/2013 1705   BUN 13.9 12/21/2012 1055   CREATININE 0.47* 01/03/2013 1705   CREATININE 0.6 12/21/2012 1055      Component Value Date/Time   CALCIUM 9.3 01/03/2013 1705   CALCIUM 9.4 12/21/2012 1055   ALKPHOS 86 01/03/2013 1705   ALKPHOS 79 12/21/2012 1055   AST 19 01/03/2013 1705   AST 19 12/21/2012 1055   ALT 17 01/03/2013 1705   ALT 23 12/21/2012 1055   BILITOT 0.2* 01/03/2013 1705   BILITOT 0.42 12/21/2012 1055     RADIOGRAPHIC STUDIES: I have personally reviewed the radiological images as listed and agreed with the findings in the report. I reviewed the imaging study of the chest from July 2014 which showed no evidence of disease   ASSESSMENT & PLAN:  #1 Hodgkin lymphoma Even though I am not able to repeat imaging study, the CT scan of the chest from July 2014 was normal. Clinically, there is no evidence to suggest disease recurrence. I recommend observation only with history, physical examination and blood work once a year from now on. #2 iron deficiency anemia She have evidence of microcytic anemia, likely secondary to iron deficiency. The patient is currently pregnant. I recommend oral iron supplements. I will repeat iron study and blood work and when I see her back next year. #3 dysphagia I  reassured her, there is nothing to suggest cancer recurrence in her throat. The patient desire GI workup. I am going to refer her to a gastroenterologist for further evaluation. #4 chronic muscle and bone pain. This is likely due to degenerative joint disease. The patient is morbidly obese. I recommend calcium with vitamin D supplements. I recommend she contact her primary care provider for chronic back pain. #5 pregnancy The patient wanted a letter of necessity to her obstetrician that states she needs termination of pregnancy. From the lymphoma standpoint, she has no evidence of disease. I do not think this is a life-threatening medical condition and hence I declined to provide her with such letter. #6 urinary tract infection The patient is currently being  treated for urinary tract infection.  Orders Placed This Encounter  Procedures  . Comprehensive metabolic panel    Standing Status: Future     Number of Occurrences:      Standing Expiration Date: 01/11/2014  . CBC & Diff and Retic    Standing Status: Future     Number of Occurrences:      Standing Expiration Date: 01/11/2014  . Lactate dehydrogenase    Standing Status: Future     Number of Occurrences:      Standing Expiration Date: 01/11/2014  . Ferritin    Standing Status: Future     Number of Occurrences:      Standing Expiration Date: 01/11/2014  . Iron and TIBC    Standing Status: Future     Number of Occurrences:      Standing Expiration Date: 01/11/2014  . Ambulatory referral to Gastroenterology    Referral Priority:  Routine    Referral Type:  Consultation    Referral Reason:  Specialty Services Required    Requested Specialty:  Gastroenterology    Number of Visits Requested:  1   All questions were answered. The patient knows to call the clinic with any problems, questions or concerns. No barriers to learning was detected.    Kisa Fujii, MD 01/11/2013 9:57 AM

## 2013-01-11 NOTE — Telephone Encounter (Signed)
Referral appt made w [name] for 02/08/13 at 945 am SW pt adv same [facility] to mail a new pt packet to pt shh

## 2013-01-11 NOTE — Telephone Encounter (Signed)
appts made AVS and CAL given Will work on referral and call pt shh

## 2013-01-12 ENCOUNTER — Encounter: Payer: Medicaid Other | Admitting: Family

## 2013-01-17 ENCOUNTER — Encounter: Payer: Medicaid Other | Admitting: Obstetrics and Gynecology

## 2013-01-26 ENCOUNTER — Telehealth: Payer: Self-pay | Admitting: *Deleted

## 2013-01-26 NOTE — Telephone Encounter (Signed)
Spoke with patient via phone to determine status of pregnancy.  Patient last seen in our office 01/10/13.  Seen by Cancer doctor 01/11/13.  Patient states she is still pregnant and plans to continue pregnancy.  New OB appointment given for 02/13/13 at 8 am.  Patient states understanding.

## 2013-01-31 ENCOUNTER — Encounter: Payer: Self-pay | Admitting: Internal Medicine

## 2013-02-06 ENCOUNTER — Telehealth: Payer: Self-pay | Admitting: *Deleted

## 2013-02-06 NOTE — Telephone Encounter (Signed)
Spoke with patient concerning hospital restriction to visitors under the age of 44 due to flu restriction.  Pt unaware of appt in the am.  Confirmed appt and pt verbalizes understanding of visitation policy.

## 2013-02-08 ENCOUNTER — Ambulatory Visit: Payer: Medicaid Other | Admitting: Internal Medicine

## 2013-02-08 ENCOUNTER — Telehealth: Payer: Self-pay | Admitting: *Deleted

## 2013-02-08 ENCOUNTER — Encounter: Payer: Self-pay | Admitting: *Deleted

## 2013-02-08 NOTE — Telephone Encounter (Signed)
Pt called nurse line requesting medication refill and has question.  Desires something for excessive gas and refill on percocet prescription.  Informed pt I will talk with a physician and call her back.  Pt verbalizes understanding.  Spoke with Dr. Ike Bene,  He says we will reevaluate at appointment on Monday and pt can take OTC gas X for gas.  Spoke with pt. Informed of recommendation by physician, pt verbalizes understanding.

## 2013-02-09 NOTE — L&D Delivery Note (Signed)
Delivery Note Progressed quickly to complete dilation.   At 9:40 PM a viable and healthy female was delivered via Vaginal, Spontaneous Delivery (Presentation: ; Occiput Anterior).   Dr Harolyn Rutherford present for delivery just in case we had problems with shoulders.  We did not have any problem with shoulders! APGAR: 9, 9; weight .   Placenta status: Intact, Spontaneous.  Cord: 3 vessels with the following complications: .   Anesthesia: None  Episiotomy: None Lacerations: None Suture Repair: none Est. Blood Loss (mL): 400  Mom to postpartum.  Baby to Couplet care / Skin to Skin  Patient desires Postpartum BTl. Will keep NPO past midnight.  Cynthia Scott 08/10/2013, 10:27 PM

## 2013-02-09 NOTE — L&D Delivery Note (Signed)
Attestation of Attending Supervision of Advanced Practitioner (PA/CNM/NP): Evaluation and management procedures were performed by the Advanced Practitioner under my supervision and collaboration.  I have reviewed the Advanced Practitioner's note and chart, and I agree with the management and plan.  Alic Hilburn, MD, FACOG Attending Obstetrician & Gynecologist Faculty Practice, [facility]   

## 2013-02-13 ENCOUNTER — Other Ambulatory Visit (HOSPITAL_COMMUNITY)
Admission: RE | Admit: 2013-02-13 | Discharge: 2013-02-13 | Disposition: A | Discharge status: Home / Self Care | Payer: Medicaid Other | Source: Ambulatory Visit | Attending: Obstetrics & Gynecology | Admitting: Obstetrics & Gynecology

## 2013-02-13 ENCOUNTER — Ambulatory Visit (INDEPENDENT_AMBULATORY_CARE_PROVIDER_SITE_OTHER): Payer: Medicaid Other | Admitting: Obstetrics & Gynecology

## 2013-02-13 ENCOUNTER — Encounter: Payer: Self-pay | Admitting: Obstetrics & Gynecology

## 2013-02-13 VITALS — BP 106/65 | Temp 97.8°F | Wt 282.1 lb

## 2013-02-13 DIAGNOSIS — Z8571 Personal history of Hodgkin lymphoma: Secondary | ICD-10-CM

## 2013-02-13 DIAGNOSIS — O9989 Other specified diseases and conditions complicating pregnancy, childbirth and the puerperium: Secondary | ICD-10-CM

## 2013-02-13 DIAGNOSIS — O09892 Supervision of other high risk pregnancies, second trimester: Secondary | ICD-10-CM

## 2013-02-13 DIAGNOSIS — Z1151 Encounter for screening for human papillomavirus (HPV): Secondary | ICD-10-CM | POA: Insufficient documentation

## 2013-02-13 DIAGNOSIS — C8190 Hodgkin lymphoma, unspecified, unspecified site: Secondary | ICD-10-CM

## 2013-02-13 DIAGNOSIS — O26899 Other specified pregnancy related conditions, unspecified trimester: Secondary | ICD-10-CM

## 2013-02-13 DIAGNOSIS — O99210 Obesity complicating pregnancy, unspecified trimester: Secondary | ICD-10-CM

## 2013-02-13 DIAGNOSIS — F1920 Other psychoactive substance dependence, uncomplicated: Secondary | ICD-10-CM

## 2013-02-13 DIAGNOSIS — O99320 Drug use complicating pregnancy, unspecified trimester: Secondary | ICD-10-CM

## 2013-02-13 DIAGNOSIS — Z113 Encounter for screening for infections with a predominantly sexual mode of transmission: Secondary | ICD-10-CM | POA: Insufficient documentation

## 2013-02-13 DIAGNOSIS — O99212 Obesity complicating pregnancy, second trimester: Principal | ICD-10-CM

## 2013-02-13 DIAGNOSIS — O0940 Supervision of pregnancy with grand multiparity, unspecified trimester: Secondary | ICD-10-CM

## 2013-02-13 DIAGNOSIS — Z01419 Encounter for gynecological examination (general) (routine) without abnormal findings: Secondary | ICD-10-CM | POA: Insufficient documentation

## 2013-02-13 DIAGNOSIS — O09899 Supervision of other high risk pregnancies, unspecified trimester: Secondary | ICD-10-CM

## 2013-02-13 DIAGNOSIS — E669 Obesity, unspecified: Secondary | ICD-10-CM

## 2013-02-13 DIAGNOSIS — M549 Dorsalgia, unspecified: Secondary | ICD-10-CM | POA: Insufficient documentation

## 2013-02-13 DIAGNOSIS — O0990 Supervision of high risk pregnancy, unspecified, unspecified trimester: Secondary | ICD-10-CM

## 2013-02-13 DIAGNOSIS — O99332 Smoking (tobacco) complicating pregnancy, second trimester: Secondary | ICD-10-CM

## 2013-02-13 DIAGNOSIS — Z302 Encounter for sterilization: Secondary | ICD-10-CM | POA: Insufficient documentation

## 2013-02-13 DIAGNOSIS — O99330 Smoking (tobacco) complicating pregnancy, unspecified trimester: Secondary | ICD-10-CM

## 2013-02-13 DIAGNOSIS — O0941 Supervision of pregnancy with grand multiparity, first trimester: Secondary | ICD-10-CM

## 2013-02-13 LAB — POCT URINALYSIS DIP (DEVICE)
BILIRUBIN URINE: NEGATIVE
GLUCOSE, UA: NEGATIVE mg/dL
HGB URINE DIPSTICK: NEGATIVE
Ketones, ur: NEGATIVE mg/dL
Nitrite: NEGATIVE
PH: 7 (ref 5.0–8.0)
Protein, ur: NEGATIVE mg/dL
Specific Gravity, Urine: 1.02 (ref 1.005–1.030)
UROBILINOGEN UA: 0.2 mg/dL (ref 0.0–1.0)

## 2013-02-13 MED ORDER — FERROUS SULFATE 325 (65 FE) MG PO TABS: 325.0000 mg | ORAL_TABLET | Freq: Two times a day (BID) | ORAL | Status: DC

## 2013-02-13 MED ORDER — OXYCODONE-ACETAMINOPHEN 5-325 MG PO TABS: 1.0000 | ORAL_TABLET | ORAL | Status: DC | PRN

## 2013-02-13 MED ORDER — DOCUSATE SODIUM 100 MG PO CAPS: 100.0000 mg | ORAL_CAPSULE | Freq: Two times a day (BID) | ORAL | Status: DC | PRN

## 2013-02-13 MED ORDER — CYCLOBENZAPRINE HCL 10 MG PO TABS: 10.0000 mg | ORAL_TABLET | Freq: Three times a day (TID) | ORAL | Status: DC | PRN

## 2013-02-13 NOTE — Progress Notes (Signed)
Subjective:    Cynthia Scott is a [id number] at [redacted]w[redacted]d being seen today for her first obstetrical visit.  Her obstetrical history is significant for grand multiparity, morbid obesity and h/o Hodgkin's lymphoma. Patient does intend to breast feed. Pregnancy history fully reviewed.  Patient does not have a history of DVT, had a clot near her port-a-cath, no need for DVT prophylaxis.  Patient reports backache, was prescribed Percocet for back pain and desires refill. Has tried OTC remedies, Flexeril.  Filed Vitals:   02/13/13 0822  BP: 106/65  Temp: 97.8 F (36.6 C)  Weight: 282 lb 1.6 oz (127.96 kg)   HISTORY: OB History  Gravida Para Term Preterm AB SAB TAB Ectopic Multiple Living  11 6 6  4 4  0   6    # Outcome Date GA Lbr Len/2nd Weight Sex Delivery Anes PTL Lv  11 CUR           10 TRM 03/04/12 [redacted]w[redacted]d 06:37 / 00:04 8 lb 14.7 oz (4.045 kg) F SVD None  Y     Comments: wnl  9 TRM 07/11/10 [redacted]w[redacted]d  5 lb (2.268 kg) M SVD None  Y  8 TRM 09/23/06 [redacted]w[redacted]d  7 lb (3.175 kg) M SVD None  Y  7 TRM 09/10/03 [redacted]w[redacted]d  8 lb 7 oz (3.827 kg) M SVD None  Y  6 TRM 09/12/02 [redacted]w[redacted]d  9 lb 12 oz (4.423 kg) F SVD None  Y  5 TRM 08/22/00 [redacted]w[redacted]d  7 lb 14 oz (3.572 kg) M SVD None  Y  4 SAB           3 SAB           2 SAB           1 SAB              Past Medical History  Diagnosis Date  . Blood transfusion 01/25/2009  . Urinary tract infection   . 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 Anemia   . Non-Hodgkin lymphoma   . 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 Cancer   . Non Hodgkin's lymphoma 2012  . Hodgkins lymphoma     pt sttes she was supposed to get treatment but showed up pregnant  . Obesity complicating pregnancy in third trimester   . Pregnancy induced hypertension   . Blood transfusion without reported diagnosis   . Weight gain 12/21/2012  . Dysphagia 01/11/2013  . Anemia, unspecified 01/11/2013   Past Surgical History  Procedure Laterality Date  . Rhinoplasty    . Port-a-cath removal    .  Chest wall tumor excision    . Tonsillectomy    . Wisdom tooth extraction  2010  . Appendectomy    . Cholecystectomy    . Tubes in ears    . Turmor in chest     Family History  Problem Relation Age of Onset  . Anesthesia problems Neg Hx   . Heart disease Mother   . COPD Mother   . Diabetes Mother   . Heart disease Maternal Aunt   . Diabetes Maternal Aunt   . Cancer Maternal Uncle     colon cancer  . Heart disease Maternal Grandmother     Exam   Uterus:   Pelvic Exam:    Perineum: No Hemorrhoids, Normal Perineum   Vulva: normal   Vagina:  normal mucosa, normal discharge   Cervix: multiparous appearance and no bleeding following Pap  Adnexa: normal adnexa and no mass, fullness, tenderness   Bony Pelvis: gynecoid  System: Breast:  normal appearance, no masses or tenderness   Skin: normal coloration and turgor, no rashes   Neurologic: normal   Extremities: normal strength, tone, and muscle mass   HEENT PERRLA and extra ocular movement intact   Mouth/Teeth mucous membranes moist, pharynx normal without lesions and dental hygiene good   Neck supple and no masses   Cardiovascular: regular rate and rhythm   Respiratory:  appears well, vitals normal, no respiratory distress, acyanotic, normal RR, chest clear, no wheezing, crepitations, rhonchi, normal symmetric air entry   Abdomen: soft, non-tender; bowel sounds normal; no masses,  no organomegaly   Urinary: urethral meatus normal      Assessment:    Pregnancy: [id number] Patient Active Problem List   Diagnosis Date Noted  . Short interval between pregnancies complicating pregnancy, antepartum 12/23/2012    Priority: High  . History of Hodgkins lymphoma 12/21/2012    Priority: High  . [place] multiparity with current pregnancy, antepartum 09/10/2011    Priority: High  . Maternal morbid obesity, antepartum 09/10/2011    Priority: High  . Desires Sterilization 02/13/2013    Priority: Medium  . Morbid obesity with body  mass index of 50.0-59.9 in adult 12/23/2012    Priority: Medium  . Supervision of high-risk pregnancy 09/10/2011    Priority: Medium  . Tobacco use complicating pregnancy 76/54/6503    Priority: Medium  . Maternal atypical antibody complicating pregnancy-Anti Kell 09/10/2011    Priority: Medium  . Pregnancy related back pain, antepartum 02/13/2013  . Dysphagia 01/11/2013  . Anemia, unspecified 01/11/2013  . Anemia 09/10/2011     Plan:   Reviewed initial labs. Continue Prenatal vitamins. Percocet #30 prescribed and Flexeril, pain contract signed.  Counseled about risk of dependence, NAS Problem list reviewed and updated. Genetic Screening discussed Quad Screen: requested. Ultrasound discussed; fetal survey: ordered. Follow up in 4 weeks.   Osborne Oman, M.D. 02/13/2013

## 2013-02-13 NOTE — Patient Instructions (Signed)
Pregnancy - First Trimester  During sexual intercourse, millions of sperm go into the vagina. Only 1 sperm will penetrate and fertilize the female egg while it is in the Fallopian tube. One week later, the fertilized egg implants into the wall of the uterus. An embryo begins to develop into a baby. At 6 to 8 weeks, the eyes and face are formed and the heartbeat can be seen on ultrasound. At the end of 12 weeks (first trimester), all the baby's organs are formed. Now that you are pregnant, you will want to do everything you can to have a healthy baby. Two of the most important things are to get good prenatal care and follow your caregiver's instructions. Prenatal care is all the medical care you receive before the baby's birth. It is given to prevent, find, and treat problems during the pregnancy and childbirth.  PRENATAL EXAMS  · During prenatal visits, your weight, blood pressure, and urine are checked. This is done to make sure you are healthy and progressing normally during the pregnancy.  · A pregnant woman should gain 25 to 35 pounds during the pregnancy. However, if you are overweight or underweight, your caregiver will advise you regarding your weight.  · Your caregiver will ask and answer questions for you.  · Blood work, cervical cultures, other necessary tests, and a Pap test are done during your prenatal exams. These tests are done to check on your health and the probable health of your baby. Tests are strongly recommended and done for HIV with your permission. This is the virus that causes AIDS. These tests are done because medicines can be given to help prevent your baby from being born with this infection should you have been infected without knowing it. Blood work is also used to find out your blood type, previous infections, and follow your blood levels (hemoglobin).  · Low hemoglobin (anemia) is common during pregnancy. Iron and vitamins are given to help prevent this. Later in the pregnancy, blood  tests for diabetes will be done along with any other tests if any problems develop.  · You may need other tests to make sure you and the baby are doing well.  CHANGES DURING THE FIRST TRIMESTER   Your body goes through many changes during pregnancy. They vary from person to person. Talk to your caregiver about changes you notice and are concerned about. Changes can include:  · Your menstrual period stops.  · The egg and sperm carry the genes that determine what you look like. Genes from you and your partner are forming a baby. The female genes determine whether the baby is a boy or a girl.  · Your body increases in girth and you may feel bloated.  · Feeling sick to your stomach (nauseous) and throwing up (vomiting). If the vomiting is uncontrollable, call your caregiver.  · Your breasts will begin to enlarge and become tender.  · Your nipples may stick out more and become darker.  · The need to urinate more. Painful urination may mean you have a bladder infection.  · Tiring easily.  · Loss of appetite.  · Cravings for certain kinds of food.  · At first, you may gain or lose a couple of pounds.  · You may have changes in your emotions from day to day (excited to be pregnant or concerned something may go wrong with the pregnancy and baby).  · You may have more vivid and strange dreams.  HOME CARE INSTRUCTIONS   ·   It is very important to avoid all smoking, alcohol and non-prescribed drugs during your pregnancy. These affect the formation and growth of the baby. Avoid chemicals while pregnant to ensure the delivery of a healthy infant.  · Start your prenatal visits by the 12th week of pregnancy. They are usually scheduled monthly at first, then more often in the last 2 months before delivery. Keep your caregiver's appointments. Follow your caregiver's instructions regarding medicine use, blood and lab tests, exercise, and diet.  · During pregnancy, you are providing food for you and your baby. Eat regular, well-balanced  meals. Choose foods such as meat, fish, milk and other low fat dairy products, vegetables, fruits, and whole-grain breads and cereals. Your caregiver will tell you of the ideal weight gain.  · You can help morning sickness by keeping soda crackers at the bedside. Eat a couple before arising in the morning. You may want to use the crackers without salt on them.  · Eating 4 to 5 small meals rather than 3 large meals a day also may help the nausea and vomiting.  · Drinking liquids between meals instead of during meals also seems to help nausea and vomiting.  · A physical sexual relationship may be continued throughout pregnancy if there are no other problems. Problems may be early (premature) leaking of amniotic fluid from the membranes, vaginal bleeding, or belly (abdominal) pain.  · Exercise regularly if there are no restrictions. Check with your caregiver or physical therapist if you are unsure of the safety of some of your exercises. Greater weight gain will occur in the last 2 trimesters of pregnancy. Exercising will help:  · Control your weight.  · Keep you in shape.  · Prepare you for labor and delivery.  · Help you lose your pregnancy weight after you deliver your baby.  · Wear a good support or jogging bra for breast tenderness during pregnancy. This may help if worn during sleep too.  · Ask when prenatal classes are available. Begin classes when they are offered.  · Do not use hot tubs, steam rooms, or saunas.  · Wear your seat belt when driving. This protects you and your baby if you are in an accident.  · Avoid raw meat, uncooked cheese, cat litter boxes, and soil used by cats throughout the pregnancy. These carry germs that can cause birth defects in the baby.  · The first trimester is a good time to visit your dentist for your dental health. Getting your teeth cleaned is okay. Use a softer toothbrush and brush gently during pregnancy.  · Ask for help if you have financial, counseling, or nutritional needs  during pregnancy. Your caregiver will be able to offer counseling for these needs as well as refer you for other special needs.  · Do not take any medicines or herbs unless told by your caregiver.  · Inform your caregiver if there is any mental or physical domestic violence.  · Make a list of emergency phone numbers of family, friends, hospital, and police and fire departments.  · Write down your questions. Take them to your prenatal visit.  · Do not douche.  · Do not cross your legs.  · If you have to stand for long periods of time, rotate you feet or take small steps in a circle.  · You may have more vaginal secretions that may require a sanitary pad. Do not use tampons or scented sanitary pads.  MEDICINES AND DRUG USE IN PREGNANCY  ·   Take prenatal vitamins as directed. The vitamin should contain 1 milligram of folic acid. Keep all vitamins out of reach of children. Only a couple vitamins or tablets containing iron may be fatal to a baby or young child when ingested.  · Avoid use of all medicines, including herbs, over-the-counter medicines, not prescribed or suggested by your caregiver. Only take over-the-counter or prescription medicines for pain, discomfort, or fever as directed by your caregiver. Do not use aspirin, ibuprofen, or naproxen unless directed by your caregiver.  · Let your caregiver also know about herbs you may be using.  · Alcohol is related to a number of birth defects. This includes fetal alcohol syndrome. All alcohol, in any form, should be avoided completely. Smoking will cause low birth rate and premature babies.  · Street or illegal drugs are very harmful to the baby. They are absolutely forbidden. A baby born to an addicted mother will be addicted at birth. The baby will go through the same withdrawal an adult does.  · Let your caregiver know about any medicines that you have to take and for what reason you take them.  SEEK MEDICAL CARE IF:   You have any concerns or worries during your  pregnancy. It is better to call with your questions if you feel they cannot wait, rather than worry about them.  SEEK IMMEDIATE MEDICAL CARE IF:   · An unexplained oral temperature above 102° F (38.9° C) develops, or as your caregiver suggests.  · You have leaking of fluid from the vagina (birth canal). If leaking membranes are suspected, take your temperature and inform your caregiver of this when you call.  · There is vaginal spotting or bleeding. Notify your caregiver of the amount and how many pads are used.  · You develop a bad smelling vaginal discharge with a change in the color.  · You continue to feel sick to your stomach (nauseated) and have no relief from remedies suggested. You vomit blood or coffee ground-like materials.  · You lose more than 2 pounds of weight in 1 week.  · You gain more than 2 pounds of weight in 1 week and you notice swelling of your face, hands, feet, or legs.  · You gain 5 pounds or more in 1 week (even if you do not have swelling of your hands, face, legs, or feet).  · You get exposed to German measles and have never had them.  · You are exposed to fifth disease or chickenpox.  · You develop belly (abdominal) pain. Round ligament discomfort is a common non-cancerous (benign) cause of abdominal pain in pregnancy. Your caregiver still must evaluate this.  · You develop headache, fever, diarrhea, pain with urination, or shortness of breath.  · You fall or are in a car accident or have any kind of trauma.  · There is mental or physical violence in your home.  Document Released: 01/20/2001 Document Revised: 10/21/2011 Document Reviewed: 07/24/2008  ExitCare® Patient Information ©2014 [employer].

## 2013-02-13 NOTE — Progress Notes (Signed)
Nutrition note: 1st visit consult Pt has h/o obesity and Hodgkins lymphoma. Pt has lost 0.9# @ [redacted]w[redacted]d. Pt has low iron and reports having a blood infusion 01/26/13. Pt plans to start taking iron supplement soon. Pt reports eating ~6x/d. Intake appears high in sweet tea. Pt is taking [place] vitamins. Pt reports no N/V or heartburn. Pt received verbal & written education about general nutrition during pregnancy. Encouraged decreasing amount of sweet tea. Discussed wt gain goals of 11-20# or 0.5#/wk. Pt agrees to continue to take multivitamin. Pt does not have [place] but plans to apply. Pt plans to BF. F/u in 4-6 wks Vladimir Faster, MS, RD, LDN, [facility]

## 2013-02-13 NOTE — Progress Notes (Signed)
Pulse- 83  Pain-lower back Declined flu vaccine Pt would like a refill on her percocet for her severe back pain that Dr. Elly Modena had prescribed to her before Pt wants a BTL

## 2013-02-13 NOTE — Progress Notes (Signed)
U/S scheduled with MFM on 03/16/13 at 10 am.

## 2013-03-09 ENCOUNTER — Encounter (HOSPITAL_COMMUNITY): Payer: Self-pay | Admitting: General Practice

## 2013-03-09 ENCOUNTER — Encounter: Payer: Self-pay | Admitting: *Deleted

## 2013-03-09 ENCOUNTER — Inpatient Hospital Stay (HOSPITAL_COMMUNITY)
Admission: AD | Admit: 2013-03-09 | Discharge: 2013-03-09 | Disposition: A | Discharge status: Home / Self Care | Payer: Medicaid Other | Source: Ambulatory Visit | Attending: Obstetrics & Gynecology | Admitting: Obstetrics & Gynecology

## 2013-03-09 DIAGNOSIS — C8589 Other specified types of non-Hodgkin lymphoma, extranodal and solid organ sites: Secondary | ICD-10-CM | POA: Insufficient documentation

## 2013-03-09 DIAGNOSIS — M549 Dorsalgia, unspecified: Secondary | ICD-10-CM

## 2013-03-09 DIAGNOSIS — IMO0002 Reserved for concepts with insufficient information to code with codable children: Secondary | ICD-10-CM | POA: Insufficient documentation

## 2013-03-09 DIAGNOSIS — O2390 Unspecified genitourinary tract infection in pregnancy, unspecified trimester: Secondary | ICD-10-CM | POA: Insufficient documentation

## 2013-03-09 DIAGNOSIS — O9989 Other specified diseases and conditions complicating pregnancy, childbirth and the puerperium: Secondary | ICD-10-CM

## 2013-03-09 DIAGNOSIS — F1110 Opioid abuse, uncomplicated: Secondary | ICD-10-CM | POA: Diagnosis present

## 2013-03-09 DIAGNOSIS — N390 Urinary tract infection, site not specified: Secondary | ICD-10-CM | POA: Insufficient documentation

## 2013-03-09 DIAGNOSIS — O99891 Other specified diseases and conditions complicating pregnancy: Secondary | ICD-10-CM | POA: Insufficient documentation

## 2013-03-09 DIAGNOSIS — O99330 Smoking (tobacco) complicating pregnancy, unspecified trimester: Secondary | ICD-10-CM | POA: Insufficient documentation

## 2013-03-09 DIAGNOSIS — Y92009 Unspecified place in unspecified non-institutional (private) residence as the place of occurrence of the external cause: Secondary | ICD-10-CM | POA: Insufficient documentation

## 2013-03-09 LAB — URINALYSIS, ROUTINE W REFLEX MICROSCOPIC
BILIRUBIN URINE: NEGATIVE
Glucose, UA: NEGATIVE mg/dL
Ketones, ur: NEGATIVE mg/dL
NITRITE: POSITIVE — AB
PROTEIN: NEGATIVE mg/dL
SPECIFIC GRAVITY, URINE: 1.025 (ref 1.005–1.030)
Urobilinogen, UA: 0.2 mg/dL (ref 0.0–1.0)
pH: 6 (ref 5.0–8.0)

## 2013-03-09 LAB — URINE MICROSCOPIC-ADD ON

## 2013-03-09 MED ORDER — NITROFURANTOIN MONOHYD MACRO 100 MG PO CAPS: 100.0000 mg | ORAL_CAPSULE | Freq: Two times a day (BID) | ORAL | Status: DC

## 2013-03-09 NOTE — Discharge Instructions (Signed)
Back Exercises Back exercises help treat and prevent back injuries. The goal of back exercises is to increase the strength of your abdominal and back muscles and the flexibility of your back. These exercises should be started when you no longer have back pain. Back exercises include:  Pelvic Tilt. Lie on your back with your knees bent. Tilt your pelvis until the lower part of your back is against the floor. Hold this position 5 to 10 sec and repeat 5 to 10 times.  Knee to Chest. Pull first 1 knee up against your chest and hold for 20 to 30 seconds, repeat this with the other knee, and then both knees. This may be done with the other leg straight or bent, whichever feels better.  Sit-Ups or Curl-Ups. Bend your knees 90 degrees. Start with tilting your pelvis, and do a partial, slow sit-up, lifting your trunk only 30 to 45 degrees off the floor. Take at least 2 to 3 seconds for each sit-up. Do not do sit-ups with your knees out straight. If partial sit-ups are difficult, simply do the above but with only tightening your abdominal muscles and holding it as directed.  Hip-Lift. Lie on your back with your knees flexed 90 degrees. Push down with your feet and shoulders as you raise your hips a couple inches off the floor; hold for 10 seconds, repeat 5 to 10 times.  Back arches. Lie on your stomach, propping yourself up on bent elbows. Slowly press on your hands, causing an arch in your low back. Repeat 3 to 5 times. Any initial stiffness and discomfort should lessen with repetition over time.  Shoulder-Lifts. Lie face down with arms beside your body. Keep hips and torso pressed to floor as you slowly lift your head and shoulders off the floor. Do not overdo your exercises, especially in the beginning. Exercises may cause you some mild back discomfort which lasts for a few minutes; however, if the pain is more severe, or lasts for more than 15 minutes, do not continue exercises until you see your caregiver.  Improvement with exercise therapy for back problems is slow.  See your caregivers for assistance with developing a proper back exercise program. Document Released: 03/05/2004 Document Revised: 04/20/2011 Document Reviewed: 11/27/2010 ExitCare Patient Information 2014 [employer].  

## 2013-03-09 NOTE — MAU Provider Note (Signed)
Attestation of Attending Supervision of Advanced Practitioner (PA/CNM/NP): Evaluation and management procedures were performed by the Advanced Practitioner under my supervision and collaboration.  I have reviewed the Advanced Practitioner's note and chart, and I agree with the management and plan.  Damascus Feldpausch, MD, FACOG Attending Obstetrician & Gynecologist Faculty Practice, [facility]  
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
## 2013-03-09 NOTE — MAU Note (Signed)
Pt not in lobby at 1924
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
## 2013-03-10 LAB — URINE CULTURE

## 2013-03-13 ENCOUNTER — Encounter: Payer: Medicaid Other | Admitting: Obstetrics & Gynecology

## 2013-03-16 ENCOUNTER — Encounter: Payer: Self-pay | Admitting: Obstetrics & Gynecology

## 2013-03-16 ENCOUNTER — Ambulatory Visit (HOSPITAL_COMMUNITY)
Admission: RE | Admit: 2013-03-16 | Discharge: 2013-03-16 | Disposition: A | Discharge status: Home / Self Care | Payer: Medicaid Other | Source: Ambulatory Visit | Attending: Obstetrics & Gynecology | Admitting: Obstetrics & Gynecology

## 2013-03-16 ENCOUNTER — Ambulatory Visit (HOSPITAL_COMMUNITY): Payer: Medicaid Other

## 2013-03-16 VITALS — BP 129/53 | HR 93 | Wt 286.0 lb

## 2013-03-16 DIAGNOSIS — C8190 Hodgkin lymphoma, unspecified, unspecified site: Secondary | ICD-10-CM | POA: Insufficient documentation

## 2013-03-16 DIAGNOSIS — O0941 Supervision of pregnancy with grand multiparity, first trimester: Secondary | ICD-10-CM

## 2013-03-16 DIAGNOSIS — O99891 Other specified diseases and conditions complicating pregnancy: Secondary | ICD-10-CM | POA: Diagnosis not present

## 2013-03-16 DIAGNOSIS — Z363 Encounter for antenatal screening for malformations: Secondary | ICD-10-CM | POA: Insufficient documentation

## 2013-03-16 DIAGNOSIS — Z1389 Encounter for screening for other disorder: Secondary | ICD-10-CM | POA: Diagnosis not present

## 2013-03-16 DIAGNOSIS — Z6841 Body Mass Index (BMI) 40.0 and over, adult: Secondary | ICD-10-CM

## 2013-03-16 DIAGNOSIS — O358XX0 Maternal care for other (suspected) fetal abnormality and damage, not applicable or unspecified: Secondary | ICD-10-CM | POA: Diagnosis not present

## 2013-03-16 DIAGNOSIS — O99330 Smoking (tobacco) complicating pregnancy, unspecified trimester: Secondary | ICD-10-CM

## 2013-03-16 DIAGNOSIS — O9989 Other specified diseases and conditions complicating pregnancy, childbirth and the puerperium: Secondary | ICD-10-CM

## 2013-03-16 DIAGNOSIS — O0940 Supervision of pregnancy with grand multiparity, unspecified trimester: Secondary | ICD-10-CM

## 2013-03-20 ENCOUNTER — Encounter: Payer: Medicaid Other | Admitting: Obstetrics & Gynecology

## 2013-04-15 ENCOUNTER — Encounter: Payer: Self-pay | Admitting: *Deleted

## 2013-04-17 ENCOUNTER — Encounter: Payer: Medicaid Other | Admitting: Obstetrics & Gynecology

## 2013-04-17 ENCOUNTER — Encounter: Payer: Self-pay | Admitting: Obstetrics and Gynecology

## 2013-04-24 ENCOUNTER — Ambulatory Visit (INDEPENDENT_AMBULATORY_CARE_PROVIDER_SITE_OTHER): Payer: Medicaid Other | Admitting: Obstetrics & Gynecology

## 2013-04-24 VITALS — BP 133/78 | Temp 97.8°F | Wt 286.8 lb

## 2013-04-24 DIAGNOSIS — O361190 Maternal care for Anti-A sensitization, unspecified trimester, not applicable or unspecified: Secondary | ICD-10-CM

## 2013-04-24 DIAGNOSIS — O0940 Supervision of pregnancy with grand multiparity, unspecified trimester: Secondary | ICD-10-CM

## 2013-04-24 DIAGNOSIS — O361990 Maternal care for other isoimmunization, unspecified trimester, not applicable or unspecified: Secondary | ICD-10-CM

## 2013-04-24 DIAGNOSIS — D649 Anemia, unspecified: Secondary | ICD-10-CM

## 2013-04-24 DIAGNOSIS — O09899 Supervision of other high risk pregnancies, unspecified trimester: Secondary | ICD-10-CM

## 2013-04-24 DIAGNOSIS — O0990 Supervision of high risk pregnancy, unspecified, unspecified trimester: Secondary | ICD-10-CM

## 2013-04-24 LAB — POCT URINALYSIS DIP (DEVICE)
Bilirubin Urine: NEGATIVE
Glucose, UA: NEGATIVE mg/dL
Hgb urine dipstick: NEGATIVE
KETONES UR: NEGATIVE mg/dL
Nitrite: NEGATIVE
Protein, ur: NEGATIVE mg/dL
Specific Gravity, Urine: 1.02 (ref 1.005–1.030)
Urobilinogen, UA: 0.2 mg/dL (ref 0.0–1.0)
pH: 7 (ref 5.0–8.0)

## 2013-04-24 MED ORDER — OXYCODONE-ACETAMINOPHEN 10-325 MG PO TABS: 1.0000 | ORAL_TABLET | ORAL | Status: DC | PRN

## 2013-04-24 NOTE — Progress Notes (Signed)
P= 86 Pt. Requests refills on all prescriptions. Iron, colace, flexeril and Percocet (not seen on med list but pt. States last ordered at last visit; pain contract signed per pt. Though not seen in media).  C/o of severe, chronic lower back pain.  Quad screen today. F/u ultrasound ordered and to be scheduled

## 2013-04-24 NOTE — Progress Notes (Signed)
Ortho referral for back pain.  Recommend PT as well as pain contract.  (Dr. Arther Abbott note stated pain contract was signed but I do not see one.  Will refer chart to K. Rassette).  Pt was given 30 percocet 10s.  Refill in 1 month. Need latest note from oncologist. BTL papers today.  Has f/u US with MFM scheduled.

## 2013-04-24 NOTE — Progress Notes (Signed)
Appointment scheduled with Dr. Nori Riis at [place] on 04/28/13 at 1045 am.

## 2013-04-25 LAB — AFP, QUAD SCREEN
AFP: 35.8 IU/mL
Age Alone: 1:521 {titer}
Curr Gest Age: 23.4 wks.days
Down Syndrome Scr Risk Est: 1:606 {titer}
HCG, Total: 3260 m[IU]/mL
INH: 434.5 pg/mL
INTERPRETATION-AFP: NEGATIVE
MOM FOR HCG: 0.35
MOM FOR INH: 2.64
MoM for AFP: 0.69
OPEN SPINA BIFIDA: NEGATIVE
Osb Risk: <1:27300 {titer}
Tri 18 Scr Risk Est: NEGATIVE
Trisomy 18 (Edward) Syndrome Interp.: 1:1530 {titer}
UE3 MOM: 0.87
uE3 Value: 1.7 ng/mL

## 2013-04-26 ENCOUNTER — Encounter: Payer: Self-pay | Admitting: Obstetrics & Gynecology

## 2013-04-27 ENCOUNTER — Ambulatory Visit (HOSPITAL_COMMUNITY)
Admission: RE | Admit: 2013-04-27 | Discharge: 2013-04-27 | Disposition: A | Discharge status: Home / Self Care | Payer: Medicaid Other | Source: Ambulatory Visit | Attending: Obstetrics & Gynecology | Admitting: Obstetrics & Gynecology

## 2013-04-27 VITALS — BP 133/52 | HR 90 | Wt 287.0 lb

## 2013-04-27 DIAGNOSIS — E669 Obesity, unspecified: Secondary | ICD-10-CM | POA: Insufficient documentation

## 2013-04-27 DIAGNOSIS — O0940 Supervision of pregnancy with grand multiparity, unspecified trimester: Secondary | ICD-10-CM

## 2013-04-27 DIAGNOSIS — O289 Unspecified abnormal findings on antenatal screening of mother: Secondary | ICD-10-CM | POA: Insufficient documentation

## 2013-04-27 DIAGNOSIS — O99330 Smoking (tobacco) complicating pregnancy, unspecified trimester: Secondary | ICD-10-CM | POA: Insufficient documentation

## 2013-04-27 DIAGNOSIS — O99210 Obesity complicating pregnancy, unspecified trimester: Secondary | ICD-10-CM

## 2013-04-27 DIAGNOSIS — Z6841 Body Mass Index (BMI) 40.0 and over, adult: Secondary | ICD-10-CM

## 2013-04-28 ENCOUNTER — Ambulatory Visit: Payer: Medicaid Other | Admitting: Family Medicine

## 2013-05-15 ENCOUNTER — Encounter: Payer: Self-pay | Admitting: Family Medicine

## 2013-05-15 ENCOUNTER — Encounter: Payer: Medicaid Other | Admitting: Obstetrics & Gynecology

## 2013-05-22 ENCOUNTER — Ambulatory Visit (INDEPENDENT_AMBULATORY_CARE_PROVIDER_SITE_OTHER): Payer: Medicaid Other | Admitting: Family Medicine

## 2013-05-22 VITALS — BP 136/72 | Temp 98.1°F | Wt 283.1 lb

## 2013-05-22 DIAGNOSIS — O0940 Supervision of pregnancy with grand multiparity, unspecified trimester: Secondary | ICD-10-CM

## 2013-05-22 DIAGNOSIS — O361990 Maternal care for other isoimmunization, unspecified trimester, not applicable or unspecified: Secondary | ICD-10-CM

## 2013-05-22 DIAGNOSIS — F1310 Sedative, hypnotic or anxiolytic abuse, uncomplicated: Secondary | ICD-10-CM

## 2013-05-22 DIAGNOSIS — Z302 Encounter for sterilization: Secondary | ICD-10-CM

## 2013-05-22 DIAGNOSIS — O99320 Drug use complicating pregnancy, unspecified trimester: Secondary | ICD-10-CM

## 2013-05-22 DIAGNOSIS — O99330 Smoking (tobacco) complicating pregnancy, unspecified trimester: Secondary | ICD-10-CM

## 2013-05-22 DIAGNOSIS — F1110 Opioid abuse, uncomplicated: Secondary | ICD-10-CM

## 2013-05-22 DIAGNOSIS — O0990 Supervision of high risk pregnancy, unspecified, unspecified trimester: Secondary | ICD-10-CM

## 2013-05-22 DIAGNOSIS — F1920 Other psychoactive substance dependence, uncomplicated: Secondary | ICD-10-CM

## 2013-05-22 DIAGNOSIS — Z8571 Personal history of Hodgkin lymphoma: Secondary | ICD-10-CM

## 2013-05-22 DIAGNOSIS — O361190 Maternal care for Anti-A sensitization, unspecified trimester, not applicable or unspecified: Secondary | ICD-10-CM

## 2013-05-22 DIAGNOSIS — IMO0002 Reserved for concepts with insufficient information to code with codable children: Secondary | ICD-10-CM

## 2013-05-22 LAB — POCT URINALYSIS DIP (DEVICE)
Bilirubin Urine: NEGATIVE
Glucose, UA: NEGATIVE mg/dL
Ketones, ur: NEGATIVE mg/dL
Leukocytes, UA: NEGATIVE
Nitrite: NEGATIVE
Protein, ur: NEGATIVE mg/dL
Specific Gravity, Urine: 1.02 (ref 1.005–1.030)
Urobilinogen, UA: 0.2 mg/dL (ref 0.0–1.0)
pH: 7.5 (ref 5.0–8.0)

## 2013-05-22 LAB — CBC
HCT: 34.6 % — ABNORMAL LOW (ref 36.0–46.0)
Hemoglobin: 11.4 g/dL — ABNORMAL LOW (ref 12.0–15.0)
MCH: 29 pg (ref 26.0–34.0)
MCHC: 32.9 g/dL (ref 30.0–36.0)
MCV: 88 fL (ref 78.0–100.0)
PLATELETS: 305 10*3/uL (ref 150–400)
RBC: 3.93 MIL/uL (ref 3.87–5.11)
RDW: 14.8 % (ref 11.5–15.5)
WBC: 8.9 10*3/uL (ref 4.0–10.5)

## 2013-05-22 MED ORDER — OXYCODONE-ACETAMINOPHEN 10-325 MG PO TABS: 1.0000 | ORAL_TABLET | ORAL | Status: DC | PRN

## 2013-05-22 MED ORDER — TETANUS-DIPHTH-ACELL PERTUSSIS 5-2.5-18.5 LF-MCG/0.5 IM SUSP: 0.5000 mL | Freq: Once | INTRAMUSCULAR | Status: DC

## 2013-05-22 NOTE — Patient Instructions (Signed)
Second Trimester of Pregnancy The second trimester is from week 13 through week 28, months 4 through 6. The second trimester is often a time when you feel your best. Your body has also adjusted to being pregnant, and you begin to feel better physically. Usually, morning sickness has lessened or quit completely, you may have more energy, and you may have an increase in appetite. The second trimester is also a time when the fetus is growing rapidly. At the end of the sixth month, the fetus is about 9 inches long and weighs about 1 pounds. You will likely begin to feel the baby move (quickening) between 18 and 20 weeks of the pregnancy. BODY CHANGES Your body goes through many changes during pregnancy. The changes vary from woman to woman.   Your weight will continue to increase. You will notice your lower abdomen bulging out.  You may begin to get stretch marks on your hips, abdomen, and breasts.  You may develop headaches that can be relieved by medicines approved by your caregiver.  You may urinate more often because the fetus is pressing on your bladder.  You may develop or continue to have heartburn as a result of your pregnancy.  You may develop constipation because certain hormones are causing the muscles that push waste through your intestines to slow down.  You may develop hemorrhoids or swollen, bulging veins (varicose veins).  You may have back pain because of the weight gain and pregnancy hormones relaxing your joints between the bones in your pelvis and as a result of a shift in weight and the muscles that support your balance.  Your breasts will continue to grow and be tender.  Your gums may bleed and may be sensitive to brushing and flossing.  Dark spots or blotches (chloasma, mask of pregnancy) may develop on your face. This will likely fade after the baby is born.  A dark line from your belly button to the pubic area (linea nigra) may appear. This will likely fade after the  baby is born. WHAT TO EXPECT AT YOUR PRENATAL VISITS During a routine prenatal visit:  You will be weighed to make sure you and the fetus are growing normally.  Your blood pressure will be taken.  Your abdomen will be measured to track your baby's growth.  The fetal heartbeat will be listened to.  Any test results from the previous visit will be discussed. Your caregiver may ask you:  How you are feeling.  If you are feeling the baby move.  If you have had any abnormal symptoms, such as leaking fluid, bleeding, severe headaches, or abdominal cramping.  If you have any questions. Other tests that may be performed during your second trimester include:  Blood tests that check for:  Low iron levels (anemia).  Gestational diabetes (between 24 and 28 weeks).  Rh antibodies.  Urine tests to check for infections, diabetes, or protein in the urine.  An ultrasound to confirm the proper growth and development of the baby.  An amniocentesis to check for possible genetic problems.  Fetal screens for spina bifida and Down syndrome. HOME CARE INSTRUCTIONS   Avoid all smoking, herbs, alcohol, and unprescribed drugs. These chemicals affect the formation and growth of the baby.  Follow your caregiver's instructions regarding medicine use. There are medicines that are either safe or unsafe to take during pregnancy.  Exercise only as directed by your caregiver. Experiencing uterine cramps is a good sign to stop exercising.  Continue to eat regular,   healthy meals.  Wear a good support bra for breast tenderness.  Do not use hot tubs, steam rooms, or saunas.  Wear your seat belt at all times when driving.  Avoid raw meat, uncooked cheese, cat litter boxes, and soil used by cats. These carry germs that can cause birth defects in the baby.  Take your prenatal vitamins.  Try taking a stool softener (if your caregiver approves) if you develop constipation. Eat more high-fiber foods,  such as fresh vegetables or fruit and whole grains. Drink plenty of fluids to keep your urine clear or pale yellow.  Take warm sitz baths to soothe any pain or discomfort caused by hemorrhoids. Use hemorrhoid cream if your caregiver approves.  If you develop varicose veins, wear support hose. Elevate your feet for 15 minutes, 3 4 times a day. Limit salt in your diet.  Avoid heavy lifting, wear low heel shoes, and practice good posture.  Rest with your legs elevated if you have leg cramps or low back pain.  Visit your dentist if you have not gone yet during your pregnancy. Use a soft toothbrush to brush your teeth and be gentle when you floss.  A sexual relationship may be continued unless your caregiver directs you otherwise.  Continue to go to all your prenatal visits as directed by your caregiver. SEEK MEDICAL CARE IF:   You have dizziness.  You have mild pelvic cramps, pelvic pressure, or nagging pain in the abdominal area.  You have persistent nausea, vomiting, or diarrhea.  You have a bad smelling vaginal discharge.  You have pain with urination. SEEK IMMEDIATE MEDICAL CARE IF:   You have a fever.  You are leaking fluid from your vagina.  You have spotting or bleeding from your vagina.  You have severe abdominal cramping or pain.  You have rapid weight gain or loss.  You have shortness of breath with chest pain.  You notice sudden or extreme swelling of your face, hands, ankles, feet, or legs.  You have not felt your baby move in over an hour.  You have severe headaches that do not go away with medicine.  You have vision changes. Document Released: 01/20/2001 Document Revised: 09/28/2012 Document Reviewed: 03/29/2012 Exit[employer] Patient Information 2014 [employer].  

## 2013-05-22 NOTE — Progress Notes (Signed)
P=100  Pt states she need refill on percocet, is on pain contract

## 2013-05-22 NOTE — Progress Notes (Signed)
S: 33 yo [id number] here for ROBV - hx of hodgkins lymphoma - bp up today - no ctx, lof, vb. +FM -BTL papers signed last week  O: see flowsheet  A/P - unable to do fundal heights due to maternal obesity - BP up but on recheck with manual at her baseline of 130s/70s.  - narcotic refill given- has signed narcotic contract as per both prior notes. Will fill today and not need x 1 month.  - 28 week labs today  - f/u for growth already ordered for 4/16 - f/u with provider in 2 weeks.

## 2013-05-23 LAB — HIV ANTIBODY (ROUTINE TESTING W REFLEX): HIV 1&2 Ab, 4th Generation: NONREACTIVE

## 2013-05-23 LAB — GLUCOSE TOLERANCE, 1 HOUR (50G) W/O FASTING: GLUCOSE 1 HOUR GTT: 124 mg/dL (ref 70–140)

## 2013-05-23 LAB — RPR

## 2013-05-24 ENCOUNTER — Encounter: Payer: Self-pay | Admitting: Family Medicine

## 2013-05-25 ENCOUNTER — Ambulatory Visit (HOSPITAL_COMMUNITY)
Admission: RE | Admit: 2013-05-25 | Discharge: 2013-05-25 | Disposition: A | Discharge status: Home / Self Care | Payer: Medicaid Other | Source: Ambulatory Visit | Attending: Obstetrics & Gynecology | Admitting: Obstetrics & Gynecology

## 2013-05-25 DIAGNOSIS — O99330 Smoking (tobacco) complicating pregnancy, unspecified trimester: Secondary | ICD-10-CM | POA: Diagnosis not present

## 2013-05-25 DIAGNOSIS — E669 Obesity, unspecified: Secondary | ICD-10-CM | POA: Diagnosis not present

## 2013-05-25 DIAGNOSIS — Z6841 Body Mass Index (BMI) 40.0 and over, adult: Secondary | ICD-10-CM

## 2013-05-25 DIAGNOSIS — O0940 Supervision of pregnancy with grand multiparity, unspecified trimester: Secondary | ICD-10-CM

## 2013-05-25 DIAGNOSIS — O99210 Obesity complicating pregnancy, unspecified trimester: Secondary | ICD-10-CM

## 2013-05-25 NOTE — Progress Notes (Signed)
Quick Note:  Called Dr. Nelda Severe (MFM) to discuss results of AFI of 25.6 cm and SDP of 8.01 cm on ultrasound today, to see if she needs to start antenatal testing for polyhydramnios. He feels that we can repeat a scan in 3-4 weeks, and then proceed accordingly. Will continue routine prenatal care. ______

## 2013-05-30 ENCOUNTER — Other Ambulatory Visit: Payer: Self-pay | Admitting: Obstetrics & Gynecology

## 2013-05-30 DIAGNOSIS — E669 Obesity, unspecified: Secondary | ICD-10-CM

## 2013-05-30 DIAGNOSIS — O0940 Supervision of pregnancy with grand multiparity, unspecified trimester: Secondary | ICD-10-CM

## 2013-05-30 DIAGNOSIS — O99210 Obesity complicating pregnancy, unspecified trimester: Secondary | ICD-10-CM

## 2013-05-30 DIAGNOSIS — O2690 Pregnancy related conditions, unspecified, unspecified trimester: Secondary | ICD-10-CM

## 2013-05-30 DIAGNOSIS — O99340 Other mental disorders complicating pregnancy, unspecified trimester: Secondary | ICD-10-CM

## 2013-05-30 DIAGNOSIS — O337XX0 Maternal care for disproportion due to other fetal deformities, not applicable or unspecified: Secondary | ICD-10-CM

## 2013-05-30 DIAGNOSIS — O289 Unspecified abnormal findings on antenatal screening of mother: Secondary | ICD-10-CM

## 2013-06-02 ENCOUNTER — Encounter: Payer: Self-pay | Admitting: *Deleted

## 2013-06-12 ENCOUNTER — Ambulatory Visit (INDEPENDENT_AMBULATORY_CARE_PROVIDER_SITE_OTHER): Payer: Medicaid Other | Admitting: Obstetrics & Gynecology

## 2013-06-12 VITALS — BP 116/71 | HR 95 | Temp 98.0°F | Wt 281.9 lb

## 2013-06-12 DIAGNOSIS — IMO0002 Reserved for concepts with insufficient information to code with codable children: Secondary | ICD-10-CM

## 2013-06-12 DIAGNOSIS — O361990 Maternal care for other isoimmunization, unspecified trimester, not applicable or unspecified: Secondary | ICD-10-CM

## 2013-06-12 DIAGNOSIS — O09899 Supervision of other high risk pregnancies, unspecified trimester: Secondary | ICD-10-CM

## 2013-06-12 DIAGNOSIS — O99330 Smoking (tobacco) complicating pregnancy, unspecified trimester: Secondary | ICD-10-CM

## 2013-06-12 DIAGNOSIS — D649 Anemia, unspecified: Secondary | ICD-10-CM

## 2013-06-12 DIAGNOSIS — O361190 Maternal care for Anti-A sensitization, unspecified trimester, not applicable or unspecified: Secondary | ICD-10-CM

## 2013-06-12 DIAGNOSIS — O0990 Supervision of high risk pregnancy, unspecified, unspecified trimester: Secondary | ICD-10-CM

## 2013-06-12 DIAGNOSIS — Z8571 Personal history of Hodgkin lymphoma: Secondary | ICD-10-CM

## 2013-06-12 LAB — POCT URINALYSIS DIP (DEVICE)
Bilirubin Urine: NEGATIVE
Glucose, UA: NEGATIVE mg/dL
Hgb urine dipstick: NEGATIVE
Ketones, ur: NEGATIVE mg/dL
Nitrite: NEGATIVE
PH: 7 (ref 5.0–8.0)
PROTEIN: 30 mg/dL — AB
SPECIFIC GRAVITY, URINE: 1.02 (ref 1.005–1.030)
Urobilinogen, UA: 0.2 mg/dL (ref 0.0–1.0)

## 2013-06-12 MED ORDER — OXYCODONE-ACETAMINOPHEN 10-325 MG PO TABS: 1.0000 | ORAL_TABLET | Freq: Four times a day (QID) | ORAL | Status: DC | PRN

## 2013-06-12 NOTE — Progress Notes (Signed)
Low back pain.[place] f/u 5/14 asks for refill percocet, will review pain contract and follow- 2 wk supply given 20 tabs

## 2013-06-12 NOTE — Progress Notes (Signed)
C/o of continuous headache; tylenol and percocet does not cut it.  C/o of burning with urination.

## 2013-06-12 NOTE — Patient Instructions (Signed)
Third Trimester of Pregnancy The third trimester is from week 29 through week 42, months 7 through 9. The third trimester is a time when the fetus is growing rapidly. At the end of the ninth month, the fetus is about 20 inches in length and weighs 6 10 pounds.  BODY CHANGES Your body goes through many changes during pregnancy. The changes vary from woman to woman.   Your weight will continue to increase. You can expect to gain 25 35 pounds (11 16 kg) by the end of the pregnancy.  You may begin to get stretch marks on your hips, abdomen, and breasts.  You may urinate more often because the fetus is moving lower into your pelvis and pressing on your bladder.  You may develop or continue to have heartburn as a result of your pregnancy.  You may develop constipation because certain hormones are causing the muscles that push waste through your intestines to slow down.  You may develop hemorrhoids or swollen, bulging veins (varicose veins).  You may have pelvic pain because of the weight gain and pregnancy hormones relaxing your joints between the bones in your pelvis. Back aches may result from over exertion of the muscles supporting your posture.  Your breasts will continue to grow and be tender. A yellow discharge may leak from your breasts called colostrum.  Your belly button may stick out.  You may feel short of breath because of your expanding uterus.  You may notice the fetus "dropping," or moving lower in your abdomen.  You may have a bloody mucus discharge. This usually occurs a few days to a week before labor begins.  Your cervix becomes thin and soft (effaced) near your due date. WHAT TO EXPECT AT YOUR PRENATAL EXAMS  You will have prenatal exams every 2 weeks until week 36. Then, you will have weekly prenatal exams. During a routine prenatal visit:  You will be weighed to make sure you and the fetus are growing normally.  Your blood pressure is taken.  Your abdomen will be  measured to track your baby's growth.  The fetal heartbeat will be listened to.  Any test results from the previous visit will be discussed.  You may have a cervical check near your due date to see if you have effaced. At around 36 weeks, your caregiver will check your cervix. At the same time, your caregiver will also perform a test on the secretions of the vaginal tissue. This test is to determine if a type of bacteria, Group B streptococcus, is present. Your caregiver will explain this further. Your caregiver may ask you:  What your birth plan is.  How you are feeling.  If you are feeling the baby move.  If you have had any abnormal symptoms, such as leaking fluid, bleeding, severe headaches, or abdominal cramping.  If you have any questions. Other tests or screenings that may be performed during your third trimester include:  Blood tests that check for low iron levels (anemia).  Fetal testing to check the health, activity level, and growth of the fetus. Testing is done if you have certain medical conditions or if there are problems during the pregnancy. FALSE LABOR You may feel small, irregular contractions that eventually go away. These are called Braxton Hicks contractions, or false labor. Contractions may last for hours, days, or even weeks before true labor sets in. If contractions come at regular intervals, intensify, or become painful, it is best to be seen by your caregiver.  SIGNS OF LABOR   Menstrual-like cramps.  Contractions that are 5 minutes apart or less.  Contractions that start on the top of the uterus and spread down to the lower abdomen and back.  A sense of increased pelvic pressure or back pain.  A watery or bloody mucus discharge that comes from the vagina. If you have any of these signs before the 37th week of pregnancy, call your caregiver right away. You need to go to the hospital to get checked immediately. HOME CARE INSTRUCTIONS   Avoid all  smoking, herbs, alcohol, and unprescribed drugs. These chemicals affect the formation and growth of the baby.  Follow your caregiver's instructions regarding medicine use. There are medicines that are either safe or unsafe to take during pregnancy.  Exercise only as directed by your caregiver. Experiencing uterine cramps is a good sign to stop exercising.  Continue to eat regular, healthy meals.  Wear a good support bra for breast tenderness.  Do not use hot tubs, steam rooms, or saunas.  Wear your seat belt at all times when driving.  Avoid raw meat, uncooked cheese, cat litter boxes, and soil used by cats. These carry germs that can cause birth defects in the baby.  Take your prenatal vitamins.  Try taking a stool softener (if your caregiver approves) if you develop constipation. Eat more high-fiber foods, such as fresh vegetables or fruit and whole grains. Drink plenty of fluids to keep your urine clear or pale yellow.  Take warm sitz baths to soothe any pain or discomfort caused by hemorrhoids. Use hemorrhoid cream if your caregiver approves.  If you develop varicose veins, wear support hose. Elevate your feet for 15 minutes, 3 4 times a day. Limit salt in your diet.  Avoid heavy lifting, wear low heal shoes, and practice good posture.  Rest a lot with your legs elevated if you have leg cramps or low back pain.  Visit your dentist if you have not gone during your pregnancy. Use a soft toothbrush to brush your teeth and be gentle when you floss.  A sexual relationship may be continued unless your caregiver directs you otherwise.  Do not travel far distances unless it is absolutely necessary and only with the approval of your caregiver.  Take prenatal classes to understand, practice, and ask questions about the labor and delivery.  Make a trial run to the hospital.  Pack your hospital bag.  Prepare the baby's nursery.  Continue to go to all your prenatal visits as directed  by your caregiver. SEEK MEDICAL CARE IF:  You are unsure if you are in labor or if your water has broken.  You have dizziness.  You have mild pelvic cramps, pelvic pressure, or nagging pain in your abdominal area.  You have persistent nausea, vomiting, or diarrhea.  You have a bad smelling vaginal discharge.  You have pain with urination. SEEK IMMEDIATE MEDICAL CARE IF:   You have a fever.  You are leaking fluid from your vagina.  You have spotting or bleeding from your vagina.  You have severe abdominal cramping or pain.  You have rapid weight loss or gain.  You have shortness of breath with chest pain.  You notice sudden or extreme swelling of your face, hands, ankles, feet, or legs.  You have not felt your baby move in over an hour.  You have severe headaches that do not go away with medicine.  You have vision changes. Document Released: 01/20/2001 Document Revised: 09/28/2012 Document Reviewed:   You have severe abdominal cramping or pain.   You have rapid weight loss or gain.   You have shortness of breath with chest pain.   You notice sudden or extreme swelling of your face, hands, ankles, feet, or legs.   You have not felt your baby move in over an hour.   You have severe headaches that do not go away with medicine.   You have vision changes.  Document Released: 01/20/2001 Document Revised: 09/28/2012 Document Reviewed: 03/29/2012  ExitCare Patient Information 2014 [employer].

## 2013-06-16 LAB — CULTURE, OB URINE

## 2013-06-22 ENCOUNTER — Ambulatory Visit (HOSPITAL_COMMUNITY)
Admission: RE | Admit: 2013-06-22 | Discharge: 2013-06-22 | Disposition: A | Discharge status: Home / Self Care | Payer: Medicaid Other | Source: Ambulatory Visit | Attending: Obstetrics & Gynecology | Admitting: Obstetrics & Gynecology

## 2013-06-22 ENCOUNTER — Other Ambulatory Visit: Payer: Self-pay | Admitting: Obstetrics & Gynecology

## 2013-06-22 DIAGNOSIS — E669 Obesity, unspecified: Secondary | ICD-10-CM

## 2013-06-22 DIAGNOSIS — O99340 Other mental disorders complicating pregnancy, unspecified trimester: Secondary | ICD-10-CM

## 2013-06-22 DIAGNOSIS — O2690 Pregnancy related conditions, unspecified, unspecified trimester: Secondary | ICD-10-CM

## 2013-06-22 DIAGNOSIS — O99210 Obesity complicating pregnancy, unspecified trimester: Secondary | ICD-10-CM

## 2013-06-22 DIAGNOSIS — O337XX0 Maternal care for disproportion due to other fetal deformities, not applicable or unspecified: Secondary | ICD-10-CM | POA: Diagnosis not present

## 2013-06-22 DIAGNOSIS — O0940 Supervision of pregnancy with grand multiparity, unspecified trimester: Secondary | ICD-10-CM

## 2013-06-22 DIAGNOSIS — O289 Unspecified abnormal findings on antenatal screening of mother: Secondary | ICD-10-CM | POA: Diagnosis not present

## 2013-06-24 ENCOUNTER — Encounter: Payer: Self-pay | Admitting: *Deleted

## 2013-06-26 ENCOUNTER — Encounter: Payer: Medicaid Other | Admitting: Family

## 2013-06-27 ENCOUNTER — Other Ambulatory Visit (HOSPITAL_COMMUNITY): Payer: Self-pay | Admitting: Maternal and Fetal Medicine

## 2013-06-27 ENCOUNTER — Encounter (HOSPITAL_COMMUNITY): Payer: Self-pay

## 2013-06-27 ENCOUNTER — Ambulatory Visit (HOSPITAL_COMMUNITY)
Admission: RE | Admit: 2013-06-27 | Discharge: 2013-06-27 | Disposition: A | Discharge status: Home / Self Care | Payer: Medicaid Other | Source: Ambulatory Visit | Attending: Family | Admitting: Family

## 2013-06-27 VITALS — BP 119/59 | HR 95

## 2013-06-27 DIAGNOSIS — O0990 Supervision of high risk pregnancy, unspecified, unspecified trimester: Secondary | ICD-10-CM

## 2013-06-27 DIAGNOSIS — O09899 Supervision of other high risk pregnancies, unspecified trimester: Secondary | ICD-10-CM

## 2013-06-27 DIAGNOSIS — O0940 Supervision of pregnancy with grand multiparity, unspecified trimester: Secondary | ICD-10-CM

## 2013-06-27 DIAGNOSIS — O99330 Smoking (tobacco) complicating pregnancy, unspecified trimester: Secondary | ICD-10-CM

## 2013-06-27 DIAGNOSIS — O361990 Maternal care for other isoimmunization, unspecified trimester, not applicable or unspecified: Secondary | ICD-10-CM

## 2013-06-27 DIAGNOSIS — O409XX0 Polyhydramnios, unspecified trimester, not applicable or unspecified: Secondary | ICD-10-CM | POA: Insufficient documentation

## 2013-06-27 DIAGNOSIS — O358XX0 Maternal care for other (suspected) fetal abnormality and damage, not applicable or unspecified: Secondary | ICD-10-CM

## 2013-06-27 DIAGNOSIS — O99210 Obesity complicating pregnancy, unspecified trimester: Secondary | ICD-10-CM

## 2013-06-27 DIAGNOSIS — Z302 Encounter for sterilization: Secondary | ICD-10-CM

## 2013-06-30 ENCOUNTER — Encounter (HOSPITAL_COMMUNITY): Payer: Self-pay

## 2013-06-30 ENCOUNTER — Other Ambulatory Visit (HOSPITAL_COMMUNITY): Payer: Self-pay | Admitting: Maternal and Fetal Medicine

## 2013-06-30 ENCOUNTER — Ambulatory Visit (HOSPITAL_COMMUNITY): Admission: RE | Admit: 2013-06-30 | Payer: Medicaid Other | Source: Ambulatory Visit

## 2013-06-30 ENCOUNTER — Ambulatory Visit (HOSPITAL_COMMUNITY)
Admission: RE | Admit: 2013-06-30 | Discharge: 2013-06-30 | Disposition: A | Discharge status: Home / Self Care | Payer: Medicaid Other | Source: Ambulatory Visit | Attending: Family | Admitting: Family

## 2013-06-30 VITALS — BP 112/64 | HR 106 | Wt 284.5 lb

## 2013-06-30 DIAGNOSIS — O409XX0 Polyhydramnios, unspecified trimester, not applicable or unspecified: Secondary | ICD-10-CM | POA: Insufficient documentation

## 2013-06-30 DIAGNOSIS — O0940 Supervision of pregnancy with grand multiparity, unspecified trimester: Secondary | ICD-10-CM

## 2013-06-30 DIAGNOSIS — O0990 Supervision of high risk pregnancy, unspecified, unspecified trimester: Secondary | ICD-10-CM

## 2013-06-30 DIAGNOSIS — O09899 Supervision of other high risk pregnancies, unspecified trimester: Secondary | ICD-10-CM

## 2013-06-30 DIAGNOSIS — O358XX0 Maternal care for other (suspected) fetal abnormality and damage, not applicable or unspecified: Secondary | ICD-10-CM | POA: Diagnosis not present

## 2013-06-30 DIAGNOSIS — O99330 Smoking (tobacco) complicating pregnancy, unspecified trimester: Secondary | ICD-10-CM

## 2013-06-30 DIAGNOSIS — O361990 Maternal care for other isoimmunization, unspecified trimester, not applicable or unspecified: Secondary | ICD-10-CM

## 2013-06-30 DIAGNOSIS — Z3689 Encounter for other specified antenatal screening: Secondary | ICD-10-CM | POA: Insufficient documentation

## 2013-06-30 DIAGNOSIS — O99210 Obesity complicating pregnancy, unspecified trimester: Secondary | ICD-10-CM

## 2013-06-30 DIAGNOSIS — Z302 Encounter for sterilization: Secondary | ICD-10-CM

## 2013-07-04 ENCOUNTER — Other Ambulatory Visit: Payer: Self-pay | Admitting: Obstetrics & Gynecology

## 2013-07-04 ENCOUNTER — Other Ambulatory Visit (HOSPITAL_COMMUNITY): Payer: Medicaid Other

## 2013-07-04 DIAGNOSIS — O0940 Supervision of pregnancy with grand multiparity, unspecified trimester: Secondary | ICD-10-CM

## 2013-07-04 DIAGNOSIS — O99210 Obesity complicating pregnancy, unspecified trimester: Secondary | ICD-10-CM

## 2013-07-04 DIAGNOSIS — O99340 Other mental disorders complicating pregnancy, unspecified trimester: Secondary | ICD-10-CM

## 2013-07-04 DIAGNOSIS — O337XX0 Maternal care for disproportion due to other fetal deformities, not applicable or unspecified: Secondary | ICD-10-CM

## 2013-07-04 DIAGNOSIS — E669 Obesity, unspecified: Secondary | ICD-10-CM

## 2013-07-04 DIAGNOSIS — O2690 Pregnancy related conditions, unspecified, unspecified trimester: Secondary | ICD-10-CM

## 2013-07-04 DIAGNOSIS — O289 Unspecified abnormal findings on antenatal screening of mother: Secondary | ICD-10-CM

## 2013-07-05 ENCOUNTER — Ambulatory Visit (HOSPITAL_COMMUNITY)
Admission: RE | Admit: 2013-07-05 | Discharge: 2013-07-05 | Disposition: A | Discharge status: Home / Self Care | Payer: Medicaid Other | Source: Ambulatory Visit | Attending: Family | Admitting: Family

## 2013-07-05 ENCOUNTER — Encounter (HOSPITAL_COMMUNITY): Payer: Self-pay

## 2013-07-05 ENCOUNTER — Ambulatory Visit (HOSPITAL_COMMUNITY)
Admission: RE | Admit: 2013-07-05 | Discharge: 2013-07-05 | Disposition: A | Discharge status: Home / Self Care | Payer: Medicaid Other | Source: Ambulatory Visit | Attending: Obstetrics & Gynecology | Admitting: Obstetrics & Gynecology

## 2013-07-05 ENCOUNTER — Encounter: Payer: Self-pay | Admitting: Obstetrics & Gynecology

## 2013-07-05 ENCOUNTER — Other Ambulatory Visit: Payer: Self-pay | Admitting: Obstetrics & Gynecology

## 2013-07-05 DIAGNOSIS — O99340 Other mental disorders complicating pregnancy, unspecified trimester: Secondary | ICD-10-CM | POA: Diagnosis not present

## 2013-07-05 DIAGNOSIS — O99210 Obesity complicating pregnancy, unspecified trimester: Secondary | ICD-10-CM

## 2013-07-05 DIAGNOSIS — O2690 Pregnancy related conditions, unspecified, unspecified trimester: Secondary | ICD-10-CM

## 2013-07-05 DIAGNOSIS — O289 Unspecified abnormal findings on antenatal screening of mother: Secondary | ICD-10-CM | POA: Insufficient documentation

## 2013-07-05 DIAGNOSIS — E669 Obesity, unspecified: Secondary | ICD-10-CM | POA: Insufficient documentation

## 2013-07-05 DIAGNOSIS — O403XX0 Polyhydramnios, third trimester, not applicable or unspecified: Secondary | ICD-10-CM | POA: Insufficient documentation

## 2013-07-05 DIAGNOSIS — O0940 Supervision of pregnancy with grand multiparity, unspecified trimester: Secondary | ICD-10-CM

## 2013-07-05 DIAGNOSIS — O337XX0 Maternal care for disproportion due to other fetal deformities, not applicable or unspecified: Secondary | ICD-10-CM | POA: Insufficient documentation

## 2013-07-05 DIAGNOSIS — O288 Other abnormal findings on antenatal screening of mother: Secondary | ICD-10-CM

## 2013-07-07 ENCOUNTER — Encounter (HOSPITAL_COMMUNITY): Payer: Self-pay

## 2013-07-07 ENCOUNTER — Other Ambulatory Visit: Payer: Self-pay | Admitting: Family

## 2013-07-07 ENCOUNTER — Ambulatory Visit (HOSPITAL_COMMUNITY)
Admission: RE | Admit: 2013-07-07 | Discharge: 2013-07-07 | Disposition: A | Discharge status: Home / Self Care | Payer: Medicaid Other | Source: Ambulatory Visit | Attending: Family | Admitting: Family

## 2013-07-07 ENCOUNTER — Encounter: Payer: Self-pay | Admitting: General Practice

## 2013-07-07 VITALS — BP 125/68 | HR 99 | Wt 286.0 lb

## 2013-07-07 DIAGNOSIS — O99330 Smoking (tobacco) complicating pregnancy, unspecified trimester: Secondary | ICD-10-CM

## 2013-07-07 DIAGNOSIS — E669 Obesity, unspecified: Secondary | ICD-10-CM | POA: Insufficient documentation

## 2013-07-07 DIAGNOSIS — O0940 Supervision of pregnancy with grand multiparity, unspecified trimester: Secondary | ICD-10-CM

## 2013-07-07 DIAGNOSIS — O289 Unspecified abnormal findings on antenatal screening of mother: Secondary | ICD-10-CM

## 2013-07-07 DIAGNOSIS — O99340 Other mental disorders complicating pregnancy, unspecified trimester: Secondary | ICD-10-CM | POA: Insufficient documentation

## 2013-07-07 DIAGNOSIS — O2690 Pregnancy related conditions, unspecified, unspecified trimester: Secondary | ICD-10-CM

## 2013-07-07 DIAGNOSIS — O403XX0 Polyhydramnios, third trimester, not applicable or unspecified: Secondary | ICD-10-CM

## 2013-07-07 DIAGNOSIS — O361990 Maternal care for other isoimmunization, unspecified trimester, not applicable or unspecified: Secondary | ICD-10-CM

## 2013-07-07 DIAGNOSIS — O337XX0 Maternal care for disproportion due to other fetal deformities, not applicable or unspecified: Secondary | ICD-10-CM | POA: Insufficient documentation

## 2013-07-07 DIAGNOSIS — O99210 Obesity complicating pregnancy, unspecified trimester: Secondary | ICD-10-CM

## 2013-07-07 DIAGNOSIS — O09899 Supervision of other high risk pregnancies, unspecified trimester: Secondary | ICD-10-CM

## 2013-07-07 DIAGNOSIS — O0990 Supervision of high risk pregnancy, unspecified, unspecified trimester: Secondary | ICD-10-CM

## 2013-07-07 DIAGNOSIS — Z302 Encounter for sterilization: Secondary | ICD-10-CM

## 2013-07-08 ENCOUNTER — Inpatient Hospital Stay (HOSPITAL_COMMUNITY)
Admission: AD | Admit: 2013-07-08 | Discharge: 2013-07-08 | Disposition: A | Discharge status: Home / Self Care | Payer: Medicaid Other | Source: Ambulatory Visit | Attending: Obstetrics and Gynecology | Admitting: Obstetrics and Gynecology

## 2013-07-08 ENCOUNTER — Encounter (HOSPITAL_COMMUNITY): Payer: Self-pay | Admitting: *Deleted

## 2013-07-08 DIAGNOSIS — O0940 Supervision of pregnancy with grand multiparity, unspecified trimester: Secondary | ICD-10-CM

## 2013-07-08 DIAGNOSIS — N898 Other specified noninflammatory disorders of vagina: Secondary | ICD-10-CM

## 2013-07-08 DIAGNOSIS — M545 Low back pain, unspecified: Secondary | ICD-10-CM | POA: Insufficient documentation

## 2013-07-08 DIAGNOSIS — G8929 Other chronic pain: Secondary | ICD-10-CM | POA: Insufficient documentation

## 2013-07-08 DIAGNOSIS — O99330 Smoking (tobacco) complicating pregnancy, unspecified trimester: Secondary | ICD-10-CM

## 2013-07-08 DIAGNOSIS — O361990 Maternal care for other isoimmunization, unspecified trimester, not applicable or unspecified: Secondary | ICD-10-CM

## 2013-07-08 DIAGNOSIS — O26893 Other specified pregnancy related conditions, third trimester: Secondary | ICD-10-CM

## 2013-07-08 DIAGNOSIS — O9989 Other specified diseases and conditions complicating pregnancy, childbirth and the puerperium: Secondary | ICD-10-CM

## 2013-07-08 DIAGNOSIS — O09899 Supervision of other high risk pregnancies, unspecified trimester: Secondary | ICD-10-CM

## 2013-07-08 DIAGNOSIS — O99891 Other specified diseases and conditions complicating pregnancy: Secondary | ICD-10-CM | POA: Insufficient documentation

## 2013-07-08 DIAGNOSIS — Z302 Encounter for sterilization: Secondary | ICD-10-CM

## 2013-07-08 DIAGNOSIS — O403XX0 Polyhydramnios, third trimester, not applicable or unspecified: Secondary | ICD-10-CM

## 2013-07-08 DIAGNOSIS — O99210 Obesity complicating pregnancy, unspecified trimester: Secondary | ICD-10-CM

## 2013-07-08 DIAGNOSIS — O0990 Supervision of high risk pregnancy, unspecified, unspecified trimester: Secondary | ICD-10-CM

## 2013-07-08 DIAGNOSIS — O409XX0 Polyhydramnios, unspecified trimester, not applicable or unspecified: Secondary | ICD-10-CM | POA: Insufficient documentation

## 2013-07-08 LAB — URINALYSIS, ROUTINE W REFLEX MICROSCOPIC
Bilirubin Urine: NEGATIVE
GLUCOSE, UA: NEGATIVE mg/dL
Hgb urine dipstick: NEGATIVE
KETONES UR: NEGATIVE mg/dL
LEUKOCYTES UA: NEGATIVE
Nitrite: NEGATIVE
PH: 6.5 (ref 5.0–8.0)
Protein, ur: NEGATIVE mg/dL
SPECIFIC GRAVITY, URINE: 1.02 (ref 1.005–1.030)
Urobilinogen, UA: 0.2 mg/dL (ref 0.0–1.0)

## 2013-07-08 LAB — WET PREP, GENITAL
Clue Cells Wet Prep HPF POC: NONE SEEN
Trich, Wet Prep: NONE SEEN
YEAST WET PREP: NONE SEEN

## 2013-07-08 LAB — POCT FERN TEST: POCT FERN TEST: NEGATIVE

## 2013-07-08 MED ORDER — OXYCODONE-ACETAMINOPHEN 10-325 MG PO TABS: 1.0000 | ORAL_TABLET | Freq: Four times a day (QID) | ORAL | Status: DC | PRN

## 2013-07-08 NOTE — Discharge Instructions (Signed)
Preterm Labor Information Preterm labor is when labor starts at less than 37 weeks of pregnancy. The normal length of a pregnancy is 39 to 41 weeks. CAUSES Often, there is no identifiable underlying cause as to why a woman goes into preterm labor. One of the most common known causes of preterm labor is infection. Infections of the uterus, cervix, vagina, amniotic sac, bladder, kidney, or even the lungs (pneumonia) can cause labor to start. Other suspected causes of preterm labor include:   Urogenital infections, such as yeast infections and bacterial vaginosis.   Uterine abnormalities (uterine shape, uterine septum, fibroids, or bleeding from the placenta).   A cervix that has been operated on (it may fail to stay closed).   Malformations in the fetus.   Multiple gestations (twins, triplets, and so on).   Breakage of the amniotic sac.  RISK FACTORS  Having a previous history of preterm labor.   Having premature rupture of membranes (PROM).   Having a placenta that covers the opening of the cervix (placenta previa).   Having a placenta that separates from the uterus (placental abruption).   Having a cervix that is too weak to hold the fetus in the uterus (incompetent cervix).   Having too much fluid in the amniotic sac (polyhydramnios).   Taking illegal drugs or smoking while pregnant.   Not gaining enough weight while pregnant.   Being younger than 18 and older than 33 years old.   Having a low socioeconomic status.   Being African American. SYMPTOMS Signs and symptoms of preterm labor include:   Menstrual-like cramps, abdominal pain, or back pain.  Uterine contractions that are regular, as frequent as six in an hour, regardless of their intensity (may be mild or painful).  Contractions that start on the top of the uterus and spread down to the lower abdomen and back.   A sense of increased pelvic pressure.   A watery or bloody mucus discharge that  comes from the vagina.  TREATMENT Depending on the length of the pregnancy and other circumstances, your health care provider may suggest bed rest. If necessary, there are medicines that can be given to stop contractions and to mature the fetal lungs. If labor happens before 34 weeks of pregnancy, a prolonged hospital stay may be recommended. Treatment depends on the condition of both you and the fetus.  WHAT SHOULD YOU DO IF YOU THINK YOU ARE IN PRETERM LABOR? Call your health care provider right away. You will need to go to the hospital to get checked immediately. HOW CAN YOU PREVENT PRETERM LABOR IN FUTURE PREGNANCIES? You should:   Stop smoking if you smoke.  Maintain healthy weight gain and avoid chemicals and drugs that are not necessary.  Be watchful for any type of infection.  Inform your health care provider if you have a known history of preterm labor. Document Released: 04/18/2003 Document Revised: 09/28/2012 Document Reviewed: 02/29/2012 [employer] Patient Information 2014 [employer].    

## 2013-07-08 NOTE — MAU Provider Note (Signed)
History  Chief Complaint:  Back Pain and Vaginal Discharge  Cynthia Scott is a 33 y.o. [id number] female at [redacted]w[redacted]d presenting w/ report of possible uc's and leaking greenish/brownish fluid since Monday.  Chronic LBP pain during pregnancy- has pain contract, has been taking oxycodone 10/325mg , last rx'd on 5/4 #20 w/ 0RF by Dr. Roselie Awkward, states she is out and needs more.  Reports active fetal movement, contractions: unsure- some pains in lower back and abd, vaginal bleeding: none, membranes: unsure. Denies uti s/s, abnormal/malodorous vag d/c or vulvovaginal itching/irritation.   Prenatal care at [facility].  Next visit 6/1. 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  Obstetrical History: OB History   Grav Para Term Preterm Abortions TAB SAB Ect Mult Living   11 6 6  4  0 4   6      Past Medical History: Past Medical History  Diagnosis Date  . Blood transfusion 01/25/2009  . Urinary tract infection   . 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 Anemia   . Non-Hodgkin lymphoma   . 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 Cancer   . Non Hodgkin's lymphoma 2012  . Hodgkins lymphoma     pt sttes she was supposed to get treatment but showed up pregnant  . Obesity complicating pregnancy in third trimester   . Pregnancy induced hypertension   . Blood transfusion without reported diagnosis   . Weight gain 12/21/2012  . Dysphagia 01/11/2013  . Anemia, unspecified 01/11/2013    Past Surgical History: Past Surgical History  Procedure Laterality Date  . Rhinoplasty    . Port-a-cath removal    . Chest wall tumor excision    . Tonsillectomy    . Wisdom tooth extraction  2010  . Appendectomy    . Cholecystectomy    . Tubes in ears    . Turmor in chest      Social History: History   Social History  . Marital  Status: Married    Spouse Name: N/A    Number of Children: N/A  . Years of Education: N/A   Social History Main Topics  . Smoking status: Current Every Day Smoker -- 0.50 packs/day for 13 years    Types: Cigarettes  . Smokeless tobacco: Never Used  . Alcohol Use: No  . Drug Use: No  . Sexual Activity: Not Currently    Birth Control/ Protection: None     Comment: post partum   Other Topics Concern  . None   Social History Narrative   ** Merged History Encounter **        Allergies: Allergies  Allergen Reactions  . Fish Allergy Swelling    Facility-administered medications prior to admission  Medication Dose Route Frequency Provider Last Rate Last Dose  . Tdap (BOOSTRIX) injection 0.5 mL  0.5 mL Intramuscular Once Kassie Mends, MD       Prescriptions prior to admission  Medication Sig Dispense Refill  . acetaminophen (TYLENOL) 500 MG tablet Take 1,000 mg by mouth every 6 (six) hours as needed for moderate pain.       . cyclobenzaprine (FLEXERIL) 10 MG tablet Take 1 tablet (10 mg total) by mouth 3 (three) times daily as needed for muscle spasms.  60 tablet  2  . diphenhydrAMINE (BENADRYL) spray Apply 1 application topically every 4 (four) hours as needed  for itching (insect bites).      Marland Kitchen docusate sodium (COLACE) 100 MG capsule Take 1 capsule (100 mg total) by mouth 2 (two) times daily as needed.  30 capsule  2  . ferrous sulfate (FERROUSUL) 325 (65 FE) MG tablet Take 1 tablet (325 mg total) by mouth 2 (two) times daily.  60 tablet  1  . flintstones complete (FLINTSTONES) 60 MG chewable tablet Chew 2 tablets by mouth daily.      Marland Kitchen oxyCODONE-acetaminophen (PERCOCET) 10-325 MG per tablet Take 1 tablet by mouth every 6 (six) hours as needed for pain.  20 tablet  0    Review of Systems  Pertinent pos/neg as indicated in HPI  Physical Exam  Blood pressure 123/59, pulse 106, temperature 98.8 F (37.1 C), temperature source Oral, resp. rate 20, height 5' 1.5" (1.562 m), weight  129.729 kg (286 lb). General appearance: alert, cooperative and no distress Lungs: clear to auscultation bilaterally, normal effort Heart: regular rate and rhythm Abdomen: gravid, soft, non-tender  Spec exam: mod amount thin creamy white nonodorous d/c, no pooling w/ valsalva Cultures/Specimens: wet prep, fern neg  SVE: LTC  Fetal monitoring: FHR: 130 bpm, variability: moderate,  Accelerations: Present,  decelerations:  Absent Uterine activity: none  MAU Course  NST Spec exam w/ neg pooling, neg fern, wet prep SVE UA, gc/ct  Labs:  Results for orders placed during the hospital encounter of 07/08/13 (from the past 24 hour(s))  URINALYSIS, ROUTINE W REFLEX MICROSCOPIC     Status: None   Collection Time    07/08/13  3:43 PM      Result Value Ref Range   Color, Urine YELLOW  YELLOW   APPearance CLEAR  CLEAR   Specific Gravity, Urine 1.020  1.005 - 1.030   pH 6.5  5.0 - 8.0   Glucose, UA NEGATIVE  NEGATIVE mg/dL   Hgb urine dipstick NEGATIVE  NEGATIVE   Bilirubin Urine NEGATIVE  NEGATIVE   Ketones, ur NEGATIVE  NEGATIVE mg/dL   Protein, ur NEGATIVE  NEGATIVE mg/dL   Urobilinogen, UA 0.2  0.0 - 1.0 mg/dL   Nitrite NEGATIVE  NEGATIVE   Leukocytes, UA NEGATIVE  NEGATIVE  WET PREP, GENITAL     Status: Abnormal   Collection Time    07/08/13  4:00 PM      Result Value Ref Range   Yeast Wet Prep HPF POC NONE SEEN  NONE SEEN   Trich, Wet Prep NONE SEEN  NONE SEEN   Clue Cells Wet Prep HPF POC NONE SEEN  NONE SEEN   WBC, Wet Prep HPF POC MODERATE (*) NONE SEEN    Assessment and Plan  A:  [redacted]w[redacted]d SIUP  [id number]  Vag d/c in pregnancy  LBP  Intact membranes  Polyhydramnios   Cat 1 FHR P:  D/C home  Rx oxycodone 10/325mg  1 po q 6hr prn #5 w/ 0RF to get through until visit on Monday  Reviewed ptl s/s, fkc  Keep next appt at [facility] on Mon 6/1 as scheduled  Gc/ct pending   Tawnya Crook CNM,WHNP-BC 5/30/20154:34 PM

## 2013-07-08 NOTE — MAU Provider Note (Signed)
Attestation of Attending Supervision of Advanced Practitioner (CNM/NP): Evaluation and management procedures were performed by the Advanced Practitioner under my supervision and collaboration.  I have reviewed the Advanced Practitioner's note and chart, and I agree with the management and plan.  Monseratt Ledin 07/08/2013 5:39 PM

## 2013-07-08 NOTE — MAU Note (Signed)
High risk preg, (poly/ LGA)  Is having bad pain , in low back - sometimes in abd and is leaking some sort of fluid

## 2013-07-10 ENCOUNTER — Ambulatory Visit (INDEPENDENT_AMBULATORY_CARE_PROVIDER_SITE_OTHER): Payer: Medicaid Other | Admitting: Family Medicine

## 2013-07-10 VITALS — BP 127/64 | HR 103 | Wt 282.7 lb

## 2013-07-10 DIAGNOSIS — E669 Obesity, unspecified: Secondary | ICD-10-CM

## 2013-07-10 DIAGNOSIS — O0940 Supervision of pregnancy with grand multiparity, unspecified trimester: Secondary | ICD-10-CM

## 2013-07-10 DIAGNOSIS — Z6841 Body Mass Index (BMI) 40.0 and over, adult: Secondary | ICD-10-CM

## 2013-07-10 DIAGNOSIS — O409XX0 Polyhydramnios, unspecified trimester, not applicable or unspecified: Secondary | ICD-10-CM

## 2013-07-10 DIAGNOSIS — O0990 Supervision of high risk pregnancy, unspecified, unspecified trimester: Secondary | ICD-10-CM

## 2013-07-10 DIAGNOSIS — O403XX0 Polyhydramnios, third trimester, not applicable or unspecified: Secondary | ICD-10-CM

## 2013-07-10 DIAGNOSIS — O99210 Obesity complicating pregnancy, unspecified trimester: Secondary | ICD-10-CM

## 2013-07-10 LAB — POCT URINALYSIS DIP (DEVICE)
BILIRUBIN URINE: NEGATIVE
Glucose, UA: NEGATIVE mg/dL
KETONES UR: NEGATIVE mg/dL
Nitrite: NEGATIVE
PH: 7 (ref 5.0–8.0)
Protein, ur: 30 mg/dL — AB
SPECIFIC GRAVITY, URINE: 1.02 (ref 1.005–1.030)
Urobilinogen, UA: 0.2 mg/dL (ref 0.0–1.0)

## 2013-07-10 LAB — GC/CHLAMYDIA PROBE AMP
CT Probe RNA: NEGATIVE
GC Probe RNA: NEGATIVE

## 2013-07-10 MED ORDER — DIPHENHYDRAMINE-ZINC ACETATE 2-0.1 % EX LIQD: 1.0000 "application " | CUTANEOUS | Status: DC | PRN

## 2013-07-10 MED ORDER — OXYCODONE-ACETAMINOPHEN 10-325 MG PO TABS: 1.0000 | ORAL_TABLET | Freq: Two times a day (BID) | ORAL | Status: DC | PRN

## 2013-07-10 NOTE — Progress Notes (Signed)
S: 33 yo [id number] who presents for ROBV Has polyhydramnios- unclear etiology.  - doing well. NST today.    O: see flowsheet  A/P - cont twice weekly testing - NST reviewed and cat I tracing - 120 baseline, mod var, +accels.  - f/u here in 2 weeks for cultures and visit  - chronic pain from hx of hodgkins lymphoma- on narcotic contract- refill provided as per contract  Kassie Mends, MD

## 2013-07-10 NOTE — Patient Instructions (Signed)
Third Trimester of Pregnancy The third trimester is from week 29 through week 42, months 7 through 9. The third trimester is a time when the fetus is growing rapidly. At the end of the ninth month, the fetus is about 20 inches in length and weighs 6 10 pounds.  BODY CHANGES Your body goes through many changes during pregnancy. The changes vary from woman to woman.   Your weight will continue to increase. You can expect to gain 25 35 pounds (11 16 kg) by the end of the pregnancy.  You may begin to get stretch marks on your hips, abdomen, and breasts.  You may urinate more often because the fetus is moving lower into your pelvis and pressing on your bladder.  You may develop or continue to have heartburn as a result of your pregnancy.  You may develop constipation because certain hormones are causing the muscles that push waste through your intestines to slow down.  You may develop hemorrhoids or swollen, bulging veins (varicose veins).  You may have pelvic pain because of the weight gain and pregnancy hormones relaxing your joints between the bones in your pelvis. Back aches may result from over exertion of the muscles supporting your posture.  Your breasts will continue to grow and be tender. A yellow discharge may leak from your breasts called colostrum.  Your belly button may stick out.  You may feel short of breath because of your expanding uterus.  You may notice the fetus "dropping," or moving lower in your abdomen.  You may have a bloody mucus discharge. This usually occurs a few days to a week before labor begins.  Your cervix becomes thin and soft (effaced) near your due date. WHAT TO EXPECT AT YOUR PRENATAL EXAMS  You will have prenatal exams every 2 weeks until week 36. Then, you will have weekly prenatal exams. During a routine prenatal visit:  You will be weighed to make sure you and the fetus are growing normally.  Your blood pressure is taken.  Your abdomen will be  measured to track your baby's growth.  The fetal heartbeat will be listened to.  Any test results from the previous visit will be discussed.  You may have a cervical check near your due date to see if you have effaced. At around 36 weeks, your caregiver will check your cervix. At the same time, your caregiver will also perform a test on the secretions of the vaginal tissue. This test is to determine if a type of bacteria, Group B streptococcus, is present. Your caregiver will explain this further. Your caregiver may ask you:  What your birth plan is.  How you are feeling.  If you are feeling the baby move.  If you have had any abnormal symptoms, such as leaking fluid, bleeding, severe headaches, or abdominal cramping.  If you have any questions. Other tests or screenings that may be performed during your third trimester include:  Blood tests that check for low iron levels (anemia).  Fetal testing to check the health, activity level, and growth of the fetus. Testing is done if you have certain medical conditions or if there are problems during the pregnancy. FALSE LABOR You may feel small, irregular contractions that eventually go away. These are called Braxton Hicks contractions, or false labor. Contractions may last for hours, days, or even weeks before true labor sets in. If contractions come at regular intervals, intensify, or become painful, it is best to be seen by your caregiver.  SIGNS OF LABOR   Menstrual-like cramps.  Contractions that are 5 minutes apart or less.  Contractions that start on the top of the uterus and spread down to the lower abdomen and back.  A sense of increased pelvic pressure or back pain.  A watery or bloody mucus discharge that comes from the vagina. If you have any of these signs before the 37th week of pregnancy, call your caregiver right away. You need to go to the hospital to get checked immediately. HOME CARE INSTRUCTIONS   Avoid all  smoking, herbs, alcohol, and unprescribed drugs. These chemicals affect the formation and growth of the baby.  Follow your caregiver's instructions regarding medicine use. There are medicines that are either safe or unsafe to take during pregnancy.  Exercise only as directed by your caregiver. Experiencing uterine cramps is a good sign to stop exercising.  Continue to eat regular, healthy meals.  Wear a good support bra for breast tenderness.  Do not use hot tubs, steam rooms, or saunas.  Wear your seat belt at all times when driving.  Avoid raw meat, uncooked cheese, cat litter boxes, and soil used by cats. These carry germs that can cause birth defects in the baby.  Take your prenatal vitamins.  Try taking a stool softener (if your caregiver approves) if you develop constipation. Eat more high-fiber foods, such as fresh vegetables or fruit and whole grains. Drink plenty of fluids to keep your urine clear or pale yellow.  Take warm sitz baths to soothe any pain or discomfort caused by hemorrhoids. Use hemorrhoid cream if your caregiver approves.  If you develop varicose veins, wear support hose. Elevate your feet for 15 minutes, 3 4 times a day. Limit salt in your diet.  Avoid heavy lifting, wear low heal shoes, and practice good posture.  Rest a lot with your legs elevated if you have leg cramps or low back pain.  Visit your dentist if you have not gone during your pregnancy. Use a soft toothbrush to brush your teeth and be gentle when you floss.  A sexual relationship may be continued unless your caregiver directs you otherwise.  Do not travel far distances unless it is absolutely necessary and only with the approval of your caregiver.  Take prenatal classes to understand, practice, and ask questions about the labor and delivery.  Make a trial run to the hospital.  Pack your hospital bag.  Prepare the baby's nursery.  Continue to go to all your prenatal visits as directed  by your caregiver. SEEK MEDICAL CARE IF:  You are unsure if you are in labor or if your water has broken.  You have dizziness.  You have mild pelvic cramps, pelvic pressure, or nagging pain in your abdominal area.  You have persistent nausea, vomiting, or diarrhea.  You have a bad smelling vaginal discharge.  You have pain with urination. SEEK IMMEDIATE MEDICAL CARE IF:   You have a fever.  You are leaking fluid from your vagina.  You have spotting or bleeding from your vagina.  You have severe abdominal cramping or pain.  You have rapid weight loss or gain.  You have shortness of breath with chest pain.  You notice sudden or extreme swelling of your face, hands, ankles, feet, or legs.  You have not felt your baby move in over an hour.  You have severe headaches that do not go away with medicine.  You have vision changes. Document Released: 01/20/2001 Document Revised: 09/28/2012 Document Reviewed:   You have severe abdominal cramping or pain.   You have rapid weight loss or gain.   You have shortness of breath with chest pain.   You notice sudden or extreme swelling of your face, hands, ankles, feet, or legs.   You have not felt your baby move in over an hour.   You have severe headaches that do not go away with medicine.   You have vision changes.  Document Released: 01/20/2001 Document Revised: 09/28/2012 Document Reviewed: 03/29/2012  ExitCare Patient Information 2014 [employer].

## 2013-07-13 ENCOUNTER — Ambulatory Visit (HOSPITAL_COMMUNITY)
Admission: RE | Admit: 2013-07-13 | Discharge: 2013-07-13 | Disposition: A | Discharge status: Home / Self Care | Payer: Medicaid Other | Source: Ambulatory Visit | Attending: Family | Admitting: Family

## 2013-07-13 VITALS — BP 129/58 | HR 104 | Wt 285.2 lb

## 2013-07-13 DIAGNOSIS — O99340 Other mental disorders complicating pregnancy, unspecified trimester: Secondary | ICD-10-CM

## 2013-07-13 DIAGNOSIS — O99210 Obesity complicating pregnancy, unspecified trimester: Secondary | ICD-10-CM

## 2013-07-13 DIAGNOSIS — O09899 Supervision of other high risk pregnancies, unspecified trimester: Secondary | ICD-10-CM

## 2013-07-13 DIAGNOSIS — O289 Unspecified abnormal findings on antenatal screening of mother: Secondary | ICD-10-CM | POA: Diagnosis not present

## 2013-07-13 DIAGNOSIS — O99330 Smoking (tobacco) complicating pregnancy, unspecified trimester: Secondary | ICD-10-CM

## 2013-07-13 DIAGNOSIS — O2690 Pregnancy related conditions, unspecified, unspecified trimester: Secondary | ICD-10-CM

## 2013-07-13 DIAGNOSIS — O0940 Supervision of pregnancy with grand multiparity, unspecified trimester: Secondary | ICD-10-CM

## 2013-07-13 DIAGNOSIS — E669 Obesity, unspecified: Secondary | ICD-10-CM

## 2013-07-13 DIAGNOSIS — O337XX0 Maternal care for disproportion due to other fetal deformities, not applicable or unspecified: Secondary | ICD-10-CM

## 2013-07-13 DIAGNOSIS — Z302 Encounter for sterilization: Secondary | ICD-10-CM

## 2013-07-13 DIAGNOSIS — O0990 Supervision of high risk pregnancy, unspecified, unspecified trimester: Secondary | ICD-10-CM

## 2013-07-13 DIAGNOSIS — O361990 Maternal care for other isoimmunization, unspecified trimester, not applicable or unspecified: Secondary | ICD-10-CM

## 2013-07-13 DIAGNOSIS — O403XX0 Polyhydramnios, third trimester, not applicable or unspecified: Secondary | ICD-10-CM

## 2013-07-17 ENCOUNTER — Ambulatory Visit (INDEPENDENT_AMBULATORY_CARE_PROVIDER_SITE_OTHER): Payer: Medicaid Other | Admitting: Obstetrics & Gynecology

## 2013-07-17 ENCOUNTER — Encounter: Payer: Self-pay | Admitting: *Deleted

## 2013-07-17 VITALS — BP 121/80 | HR 99 | Wt 282.0 lb

## 2013-07-17 DIAGNOSIS — O409XX0 Polyhydramnios, unspecified trimester, not applicable or unspecified: Secondary | ICD-10-CM

## 2013-07-17 DIAGNOSIS — O0990 Supervision of high risk pregnancy, unspecified, unspecified trimester: Secondary | ICD-10-CM

## 2013-07-17 DIAGNOSIS — O403XX0 Polyhydramnios, third trimester, not applicable or unspecified: Secondary | ICD-10-CM

## 2013-07-17 DIAGNOSIS — Z23 Encounter for immunization: Secondary | ICD-10-CM

## 2013-07-17 LAB — POCT URINALYSIS DIP (DEVICE)
Bilirubin Urine: NEGATIVE
Glucose, UA: NEGATIVE mg/dL
HGB URINE DIPSTICK: NEGATIVE
Ketones, ur: NEGATIVE mg/dL
Leukocytes, UA: NEGATIVE
Nitrite: NEGATIVE
PROTEIN: NEGATIVE mg/dL
Specific Gravity, Urine: 1.015 (ref 1.005–1.030)
UROBILINOGEN UA: 0.2 mg/dL (ref 0.0–1.0)
pH: 7 (ref 5.0–8.0)

## 2013-07-17 LAB — FETAL NONSTRESS TEST

## 2013-07-17 NOTE — Progress Notes (Signed)
Patient reports decreased fetal movement. Also has increased pain.

## 2013-07-17 NOTE — Progress Notes (Signed)
U/S scheduled 08/03/13 at 1015 am.

## 2013-07-17 NOTE — Progress Notes (Signed)
Needs Tdap.  Followed for polyhydramnios.  History of 9+ pound babies but not diabetic.  LGA currently and rpt [place] ordered around June 25th.  Continue testing 2x week.  Cultures next visit.Marland Kitchen

## 2013-07-20 ENCOUNTER — Encounter (HOSPITAL_COMMUNITY): Payer: Self-pay

## 2013-07-20 ENCOUNTER — Ambulatory Visit (HOSPITAL_COMMUNITY)
Admission: RE | Admit: 2013-07-20 | Discharge: 2013-07-20 | Disposition: A | Discharge status: Home / Self Care | Payer: Medicaid Other | Source: Ambulatory Visit | Attending: Family | Admitting: Family

## 2013-07-20 ENCOUNTER — Other Ambulatory Visit: Payer: Self-pay | Admitting: Family

## 2013-07-20 VITALS — BP 141/64 | HR 107

## 2013-07-20 DIAGNOSIS — O3660X0 Maternal care for excessive fetal growth, unspecified trimester, not applicable or unspecified: Secondary | ICD-10-CM | POA: Diagnosis not present

## 2013-07-20 DIAGNOSIS — O99210 Obesity complicating pregnancy, unspecified trimester: Secondary | ICD-10-CM

## 2013-07-20 DIAGNOSIS — O337XX0 Maternal care for disproportion due to other fetal deformities, not applicable or unspecified: Secondary | ICD-10-CM

## 2013-07-20 DIAGNOSIS — E669 Obesity, unspecified: Secondary | ICD-10-CM | POA: Diagnosis not present

## 2013-07-20 DIAGNOSIS — O09899 Supervision of other high risk pregnancies, unspecified trimester: Secondary | ICD-10-CM

## 2013-07-20 DIAGNOSIS — O361990 Maternal care for other isoimmunization, unspecified trimester, not applicable or unspecified: Secondary | ICD-10-CM

## 2013-07-20 DIAGNOSIS — Z302 Encounter for sterilization: Secondary | ICD-10-CM

## 2013-07-20 DIAGNOSIS — O0990 Supervision of high risk pregnancy, unspecified, unspecified trimester: Secondary | ICD-10-CM

## 2013-07-20 DIAGNOSIS — O0940 Supervision of pregnancy with grand multiparity, unspecified trimester: Secondary | ICD-10-CM

## 2013-07-20 DIAGNOSIS — O2690 Pregnancy related conditions, unspecified, unspecified trimester: Secondary | ICD-10-CM

## 2013-07-20 DIAGNOSIS — O403XX0 Polyhydramnios, third trimester, not applicable or unspecified: Secondary | ICD-10-CM

## 2013-07-20 DIAGNOSIS — O289 Unspecified abnormal findings on antenatal screening of mother: Secondary | ICD-10-CM

## 2013-07-20 DIAGNOSIS — O409XX0 Polyhydramnios, unspecified trimester, not applicable or unspecified: Secondary | ICD-10-CM | POA: Insufficient documentation

## 2013-07-20 DIAGNOSIS — O99340 Other mental disorders complicating pregnancy, unspecified trimester: Secondary | ICD-10-CM

## 2013-07-20 DIAGNOSIS — Z8571 Personal history of Hodgkin lymphoma: Secondary | ICD-10-CM | POA: Diagnosis not present

## 2013-07-20 DIAGNOSIS — O99330 Smoking (tobacco) complicating pregnancy, unspecified trimester: Secondary | ICD-10-CM

## 2013-07-20 NOTE — Progress Notes (Addendum)
[facility] ultrasound  Indication: 33 yr old [id number] at [redacted]w[redacted]d with obesity, polyhydramnios, fetal macrosomia, history of hodgkin's lymphoma, and narcotic use for fetal growth and AFI.  Findings: 1. Single intrauterine pregnancy. 2. Estimated fetal weight is in the >90th%. 3. Anterior placenta without evidence of previa. 4. Polyhydramnios with AFI of 27.9cm. 5. The limited anatomy survey is normal.  Recommendations: 1. Macrosomia: - previously counseled - normal 1 hour glucola - caution in delivery as there is an increased risk of shoulder dystocia 2. Polyhydramnios: - previously counseled - recommend labor precautions - recommend continue antenatal testing - recommend delivery by estimated due date but not prior to 39 weeks in the absence of other complications 3. History of Hodgkin's lymphoma: - previously counseled 4. Narcotic use: - previously counseled 5. Elevated inhibin: - recommend close surveillance for signs/symptoms of preeclampsia  [place], MD

## 2013-07-24 ENCOUNTER — Encounter: Payer: Self-pay | Admitting: Obstetrics & Gynecology

## 2013-07-24 ENCOUNTER — Ambulatory Visit (INDEPENDENT_AMBULATORY_CARE_PROVIDER_SITE_OTHER): Payer: Medicaid Other | Admitting: Obstetrics & Gynecology

## 2013-07-24 VITALS — BP 128/74 | HR 96 | Wt 283.6 lb

## 2013-07-24 DIAGNOSIS — O0990 Supervision of high risk pregnancy, unspecified, unspecified trimester: Secondary | ICD-10-CM

## 2013-07-24 DIAGNOSIS — O0940 Supervision of pregnancy with grand multiparity, unspecified trimester: Secondary | ICD-10-CM

## 2013-07-24 DIAGNOSIS — O403XX0 Polyhydramnios, third trimester, not applicable or unspecified: Secondary | ICD-10-CM

## 2013-07-24 DIAGNOSIS — O409XX0 Polyhydramnios, unspecified trimester, not applicable or unspecified: Secondary | ICD-10-CM

## 2013-07-24 LAB — POCT URINALYSIS DIP (DEVICE)
BILIRUBIN URINE: NEGATIVE
Glucose, UA: NEGATIVE mg/dL
Hgb urine dipstick: NEGATIVE
LEUKOCYTES UA: NEGATIVE
Nitrite: NEGATIVE
Protein, ur: 30 mg/dL — AB
Specific Gravity, Urine: >=1.03 (ref 1.005–1.030)
Urobilinogen, UA: 0.2 mg/dL (ref 0.0–1.0)
pH: 6 (ref 5.0–8.0)

## 2013-07-24 LAB — OB RESULTS CONSOLE GC/CHLAMYDIA
Chlamydia: NEGATIVE
Gonorrhea: NEGATIVE

## 2013-07-24 LAB — OB RESULTS CONSOLE GBS: GBS: NEGATIVE

## 2013-07-24 NOTE — Progress Notes (Signed)
Pt reports having brown/green vaginal discharge

## 2013-07-24 NOTE — Progress Notes (Signed)
Routine visit. Good FM. Cervical cultures obtained. Cervix appears closed.

## 2013-07-25 ENCOUNTER — Encounter: Payer: Self-pay | Admitting: Family

## 2013-07-25 DIAGNOSIS — O3660X0 Maternal care for excessive fetal growth, unspecified trimester, not applicable or unspecified: Secondary | ICD-10-CM | POA: Insufficient documentation

## 2013-07-26 ENCOUNTER — Encounter (HOSPITAL_COMMUNITY): Payer: Self-pay | Admitting: *Deleted

## 2013-07-26 ENCOUNTER — Inpatient Hospital Stay (HOSPITAL_COMMUNITY)
Admission: AD | Admit: 2013-07-26 | Discharge: 2013-07-26 | Disposition: A | Discharge status: Home / Self Care | Payer: Medicaid Other | Source: Ambulatory Visit | Attending: Obstetrics & Gynecology | Admitting: Obstetrics & Gynecology

## 2013-07-26 DIAGNOSIS — O9989 Other specified diseases and conditions complicating pregnancy, childbirth and the puerperium: Secondary | ICD-10-CM

## 2013-07-26 DIAGNOSIS — O99891 Other specified diseases and conditions complicating pregnancy: Secondary | ICD-10-CM | POA: Insufficient documentation

## 2013-07-26 DIAGNOSIS — O3660X0 Maternal care for excessive fetal growth, unspecified trimester, not applicable or unspecified: Secondary | ICD-10-CM | POA: Insufficient documentation

## 2013-07-26 DIAGNOSIS — O409XX0 Polyhydramnios, unspecified trimester, not applicable or unspecified: Secondary | ICD-10-CM | POA: Insufficient documentation

## 2013-07-26 DIAGNOSIS — R109 Unspecified abdominal pain: Secondary | ICD-10-CM

## 2013-07-26 DIAGNOSIS — Z8571 Personal history of Hodgkin lymphoma: Secondary | ICD-10-CM | POA: Insufficient documentation

## 2013-07-26 DIAGNOSIS — O26899 Other specified pregnancy related conditions, unspecified trimester: Secondary | ICD-10-CM

## 2013-07-26 LAB — URINALYSIS, ROUTINE W REFLEX MICROSCOPIC
Bilirubin Urine: NEGATIVE
Glucose, UA: NEGATIVE mg/dL
Hgb urine dipstick: NEGATIVE
KETONES UR: 15 mg/dL — AB
NITRITE: NEGATIVE
PROTEIN: NEGATIVE mg/dL
Specific Gravity, Urine: 1.02 (ref 1.005–1.030)
UROBILINOGEN UA: 0.2 mg/dL (ref 0.0–1.0)
pH: 7 (ref 5.0–8.0)

## 2013-07-26 LAB — URINE MICROSCOPIC-ADD ON

## 2013-07-26 MED ORDER — GI COCKTAIL ~~LOC~~: 30.0000 mL | Freq: Once | ORAL | Status: DC

## 2013-07-26 NOTE — MAU Note (Signed)
Patient presents to MAU with c/o upper left abdominal pain that is sharp; 9/10. Also reports increased vaginal pressure that is worse with ambulation; 9/10. S/S started this am. Denies LOF or VB. Reports irregular contractions. Reports good fetal movement.

## 2013-07-26 NOTE — MAU Provider Note (Signed)
Chief Complaint:  Abdominal Pain   Cynthia Scott is a 33 y.o.  [id number] with IUP at [redacted]w[redacted]d presenting for Abdominal Pain The pain started two weeks ago. It is ongoing, constant and getting worse. Rated as a 7/10. Sharp in nature with no relief with tylenol or once daily percocet. No radiation with regular bowel movements. She's had normal voiding and no nausea or vomiting. She has a PSHx of appendectomy and cholecystectomy. History of Hodgkins lymphoma having associated back pain for which she has a pain contract to take one percocet daily.   Pregnancy complicated with LGA >83%, polyhydramnios, BMI >50, tobacco use (1 ppd), and short interval pregnancy.   She denies any LOF or VB.     Menstrual History: OB History   Grav Para Term Preterm Abortions TAB SAB Ect Mult Living   11 6 6  4  0 4   6      No LMP recorded. Patient is pregnant.      Past Medical History  Diagnosis Date  . Blood transfusion 01/25/2009  . Urinary tract infection   . 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 Anemia   . Non-Hodgkin lymphoma   . 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 Cancer   . Non Hodgkin's lymphoma 2012  . Hodgkins lymphoma     pt sttes she was supposed to get treatment but showed up pregnant  . Obesity complicating pregnancy in third trimester   . Pregnancy induced hypertension   . Blood transfusion without reported diagnosis   . Weight gain 12/21/2012  . Dysphagia 01/11/2013  . Anemia, unspecified 01/11/2013    Past Surgical History  Procedure Laterality Date  . Rhinoplasty    . Port-a-cath removal    . Chest wall tumor excision    . Tonsillectomy    . Wisdom tooth extraction  2010  . Appendectomy    . Cholecystectomy    . Tubes in ears    . Turmor in chest      Family History  Problem Relation Age of Onset  . Anesthesia problems Neg Hx   . Heart disease Mother   . COPD Mother   . Diabetes Mother   . Heart disease Maternal Aunt   . Diabetes Maternal Aunt   . Cancer Maternal  Uncle     colon cancer  . Heart disease Maternal Grandmother     History  Substance Use Topics  . Smoking status: Current Every Day Smoker -- 0.50 packs/day for 13 years    Types: Cigarettes  . Smokeless tobacco: Never Used  . Alcohol Use: No      No Active Allergies  Prescriptions prior to admission  Medication Sig Dispense Refill  . cyclobenzaprine (FLEXERIL) 10 MG tablet Take 1 tablet (10 mg total) by mouth 3 (three) times daily as needed for muscle spasms.  60 tablet  2  . diphenhydrAMINE (BENADRYL) spray Apply 1 application topically every 4 (four) hours as needed for itching (insect bites).  60 mL  0  . flintstones complete (FLINTSTONES) 60 MG chewable tablet Chew 2 tablets by mouth daily.      Marland Kitchen oxyCODONE-acetaminophen (PERCOCET) 10-325 MG per tablet Take 1 tablet by mouth 2 (two) times daily as needed for pain.  30 tablet  0    Review of Systems - Negative except for what is mentioned in HPI.  Physical Exam  Blood pressure 140/60, pulse 112, temperature 98.2 F (36.8 C), temperature source Oral, resp. rate 20, height  5\' 2"  (1.575 m), weight 129.729 kg (286 lb), SpO2 99.00%. GENERAL: Well-developed, well-nourished female in no acute distress.  LUNGS: Clear to auscultation bilaterally.  HEART: Regular rate and rhythm. ABDOMEN: Soft, nontender, nondistended, gravid, no reproducible pain on palpation EXTREMITIES: Nontender, no edema, 2+ distal pulses. FHT:  Baseline rate 135 bpm   Variability moderate  Accelerations present   Decelerations none Contractions: none   Labs: Results for orders placed during the hospital encounter of 07/26/13 (from the past 24 hour(s))  URINALYSIS, ROUTINE W REFLEX MICROSCOPIC   Collection Time    07/26/13  1:35 PM      Result Value Ref Range   Color, Urine YELLOW  YELLOW   APPearance HAZY (*) CLEAR   Specific Gravity, Urine 1.020  1.005 - 1.030   pH 7.0  5.0 - 8.0   Glucose, UA NEGATIVE  NEGATIVE mg/dL   Hgb urine dipstick NEGATIVE   NEGATIVE   Bilirubin Urine NEGATIVE  NEGATIVE   Ketones, ur 15 (*) NEGATIVE mg/dL   Protein, ur NEGATIVE  NEGATIVE mg/dL   Urobilinogen, UA 0.2  0.0 - 1.0 mg/dL   Nitrite NEGATIVE  NEGATIVE   Leukocytes, UA TRACE (*) NEGATIVE  URINE MICROSCOPIC-ADD ON   Collection Time    07/26/13  1:35 PM      Result Value Ref Range   Squamous Epithelial / LPF MANY (*) RARE   WBC, UA 0-2  <3 WBC/hpf   RBC / HPF 0-2  <3 RBC/hpf   Bacteria, UA MANY (*) RARE    Imaging Studies:  US Ob Limited  06/30/2013   OBSTETRICAL ULTRASOUND: This exam was performed within a [place] [facility]trasound Department. The OB US report was generated in the AS system, and faxed to the ordering physician.   This report is also available in [employer] and in the [employer]. See AS Obstetric US report.  US Ob Follow Up  07/20/2013   OBSTETRICAL ULTRASOUND: This exam was performed within a [place] Ultrasound Department. The OB US report was generated in the AS system, and faxed to the ordering physician.   This report is available in the [employer]. See the AS Obstetric US report via the [employer].  US Fetal Bpp W/o Non Stress  07/13/2013   OBSTETRICAL ULTRASOUND: This exam was performed within a [place] [facility]. The OB US report was generated in the AS system, and faxed to the ordering physician.   This report is available in the [employer]. See the AS Obstetric US report via the Image Link.  US Fetal Bpp W/o Non Stress  07/05/2013   OBSTETRICAL ULTRASOUND: This exam was performed within a [place] [facility]trasound Department. The OB US report was generated in the AS system, and faxed to the ordering physician.   This report is also available in [employer] and in the [employer]. See AS Obstetric US report.  US Fetal Bpp W/o Non Stress  06/30/2013   OBSTETRICAL ULTRASOUND: This exam was performed within a [place] Ultrasound Department. The OB US report was  generated in the AS system, and faxed to the ordering physician.   This report is also available in [employer] and in the [employer]. See AS Obstetric US report.   Assessment: Cynthia Scott is  33 y.o. [id number] at [redacted]w[redacted]d presents with Abdominal Pain   Plan: Discharge to home.   #Ab pain: not suggestive of reflux and having regular bowel movements. Possibly associated with  polyhydramnios. She has flexeril at home and is on a pain contract taking 1 percocet daily. Nothing to add in term of medications.  - may be beneficial to try maternity belt  - f/u as scheduled.   #FWB: cat 1   Rosemarie Ax 6/17/20152:33 PM  I have seen and examined this patient and agree with above documentation in the resident's note. LUQ with no reproducible tenderness but baby buttock on that side could also be adding to tenderness. Suspect all related to LGA baby and polyhydramnios. Reassurance and labor precautions discussed. FWB cat I tracing.    Ebbie Latus, M.D. [facility] Fellow 07/26/2013 4:13 PM

## 2013-07-26 NOTE — Discharge Instructions (Signed)
Third Trimester of Pregnancy The third trimester is from week 29 through week 42, months 7 through 9. The third trimester is a time when the fetus is growing rapidly. At the end of the ninth month, the fetus is about 20 inches in length and weighs 6-10 pounds.  BODY CHANGES Your body goes through many changes during pregnancy. The changes vary from woman to woman.   Your weight will continue to increase. You can expect to gain 25-35 pounds (11-16 kg) by the end of the pregnancy.  You may begin to get stretch marks on your hips, abdomen, and breasts.  You may urinate more often because the fetus is moving lower into your pelvis and pressing on your bladder.  You may develop or continue to have heartburn as a result of your pregnancy.  You may develop constipation because certain hormones are causing the muscles that push waste through your intestines to slow down.  You may develop hemorrhoids or swollen, bulging veins (varicose veins).  You may have pelvic pain because of the weight gain and pregnancy hormones relaxing your joints between the bones in your pelvis. Backaches may result from overexertion of the muscles supporting your posture.  You may have changes in your hair. These can include thickening of your hair, rapid growth, and changes in texture. Some women also have hair loss during or after pregnancy, or hair that feels dry or thin. Your hair will most likely return to normal after your baby is born.  Your breasts will continue to grow and be tender. A yellow discharge may leak from your breasts called colostrum.  Your belly button may stick out.  You may feel short of breath because of your expanding uterus.  You may notice the fetus "dropping," or moving lower in your abdomen.  You may have a bloody mucus discharge. This usually occurs a few days to a week before labor begins.  Your cervix becomes thin and soft (effaced) near your due date. WHAT TO EXPECT AT YOUR PRENATAL  EXAMS  You will have prenatal exams every 2 weeks until week 36. Then, you will have weekly prenatal exams. During a routine prenatal visit:  You will be weighed to make sure you and the fetus are growing normally.  Your blood pressure is taken.  Your abdomen will be measured to track your baby's growth.  The fetal heartbeat will be listened to.  Any test results from the previous visit will be discussed.  You may have a cervical check near your due date to see if you have effaced. At around 36 weeks, your caregiver will check your cervix. At the same time, your caregiver will also perform a test on the secretions of the vaginal tissue. This test is to determine if a type of bacteria, Group B streptococcus, is present. Your caregiver will explain this further. Your caregiver may ask you:  What your birth plan is.  How you are feeling.  If you are feeling the baby move.  If you have had any abnormal symptoms, such as leaking fluid, bleeding, severe headaches, or abdominal cramping.  If you have any questions. Other tests or screenings that may be performed during your third trimester include:  Blood tests that check for low iron levels (anemia).  Fetal testing to check the health, activity level, and growth of the fetus. Testing is done if you have certain medical conditions or if there are problems during the pregnancy. FALSE LABOR You may feel small, irregular contractions that   eventually go away. These are called Braxton Hicks contractions, or false labor. Contractions may last for hours, days, or even weeks before true labor sets in. If contractions come at regular intervals, intensify, or become painful, it is best to be seen by your caregiver.  SIGNS OF LABOR   Menstrual-like cramps.  Contractions that are 5 minutes apart or less.  Contractions that start on the top of the uterus and spread down to the lower abdomen and back.  A sense of increased pelvic pressure or back  pain.  A watery or bloody mucus discharge that comes from the vagina. If you have any of these signs before the 37th week of pregnancy, call your caregiver right away. You need to go to the hospital to get checked immediately. HOME CARE INSTRUCTIONS   Avoid all smoking, herbs, alcohol, and unprescribed drugs. These chemicals affect the formation and growth of the baby.  Follow your caregiver's instructions regarding medicine use. There are medicines that are either safe or unsafe to take during pregnancy.  Exercise only as directed by your caregiver. Experiencing uterine cramps is a good sign to stop exercising.  Continue to eat regular, healthy meals.  Wear a good support bra for breast tenderness.  Do not use hot tubs, steam rooms, or saunas.  Wear your seat belt at all times when driving.  Avoid raw meat, uncooked cheese, cat litter boxes, and soil used by cats. These carry germs that can cause birth defects in the baby.  Take your prenatal vitamins.  Try taking a stool softener (if your caregiver approves) if you develop constipation. Eat more high-fiber foods, such as fresh vegetables or fruit and whole grains. Drink plenty of fluids to keep your urine clear or pale yellow.  Take warm sitz baths to soothe any pain or discomfort caused by hemorrhoids. Use hemorrhoid cream if your caregiver approves.  If you develop varicose veins, wear support hose. Elevate your feet for 15 minutes, 3-4 times a day. Limit salt in your diet.  Avoid heavy lifting, wear low heal shoes, and practice good posture.  Rest a lot with your legs elevated if you have leg cramps or low back pain.  Visit your dentist if you have not gone during your pregnancy. Use a soft toothbrush to brush your teeth and be gentle when you floss.  A sexual relationship may be continued unless your caregiver directs you otherwise.  Do not travel far distances unless it is absolutely necessary and only with the approval  of your caregiver.  Take prenatal classes to understand, practice, and ask questions about the labor and delivery.  Make a trial run to the hospital.  Pack your hospital bag.  Prepare the baby's nursery.  Continue to go to all your prenatal visits as directed by your caregiver. SEEK MEDICAL CARE IF:  You are unsure if you are in labor or if your water has broken.  You have dizziness.  You have mild pelvic cramps, pelvic pressure, or nagging pain in your abdominal area.  You have persistent nausea, vomiting, or diarrhea.  You have a bad smelling vaginal discharge.  You have pain with urination. SEEK IMMEDIATE MEDICAL CARE IF:   You have a fever.  You are leaking fluid from your vagina.  You have spotting or bleeding from your vagina.  You have severe abdominal cramping or pain.  You have rapid weight loss or gain.  You have shortness of breath with chest pain.  You notice sudden or extreme swelling   of your face, hands, ankles, feet, or legs.  You have not felt your baby move in over an hour.  You have severe headaches that do not go away with medicine.  You have vision changes. Document Released: 01/20/2001 Document Revised: 01/31/2013 Document Reviewed: 03/29/2012 [employer] Patient Information 2015 [employer]. This information is not intended to replace advice given to you by your health care provider. Make sure you discuss any questions you have with your health care provider.  

## 2013-07-27 ENCOUNTER — Ambulatory Visit (HOSPITAL_COMMUNITY)
Admission: RE | Admit: 2013-07-27 | Discharge: 2013-07-27 | Disposition: A | Discharge status: Home / Self Care | Payer: Medicaid Other | Source: Ambulatory Visit | Attending: Family | Admitting: Family

## 2013-07-27 ENCOUNTER — Encounter (HOSPITAL_COMMUNITY): Payer: Self-pay

## 2013-07-27 VITALS — BP 132/75 | HR 114 | Wt 286.0 lb

## 2013-07-27 DIAGNOSIS — O289 Unspecified abnormal findings on antenatal screening of mother: Secondary | ICD-10-CM

## 2013-07-27 DIAGNOSIS — O361990 Maternal care for other isoimmunization, unspecified trimester, not applicable or unspecified: Secondary | ICD-10-CM

## 2013-07-27 DIAGNOSIS — O337XX0 Maternal care for disproportion due to other fetal deformities, not applicable or unspecified: Secondary | ICD-10-CM | POA: Insufficient documentation

## 2013-07-27 DIAGNOSIS — E669 Obesity, unspecified: Secondary | ICD-10-CM | POA: Insufficient documentation

## 2013-07-27 DIAGNOSIS — O99340 Other mental disorders complicating pregnancy, unspecified trimester: Secondary | ICD-10-CM

## 2013-07-27 DIAGNOSIS — O99350 Diseases of the nervous system complicating pregnancy, unspecified trimester: Secondary | ICD-10-CM

## 2013-07-27 DIAGNOSIS — G40909 Epilepsy, unspecified, not intractable, without status epilepticus: Secondary | ICD-10-CM | POA: Diagnosis not present

## 2013-07-27 DIAGNOSIS — O0990 Supervision of high risk pregnancy, unspecified, unspecified trimester: Secondary | ICD-10-CM

## 2013-07-27 DIAGNOSIS — O99210 Obesity complicating pregnancy, unspecified trimester: Secondary | ICD-10-CM | POA: Diagnosis not present

## 2013-07-27 DIAGNOSIS — Z302 Encounter for sterilization: Secondary | ICD-10-CM

## 2013-07-27 DIAGNOSIS — O3660X0 Maternal care for excessive fetal growth, unspecified trimester, not applicable or unspecified: Secondary | ICD-10-CM

## 2013-07-27 DIAGNOSIS — O2690 Pregnancy related conditions, unspecified, unspecified trimester: Secondary | ICD-10-CM

## 2013-07-27 DIAGNOSIS — O0940 Supervision of pregnancy with grand multiparity, unspecified trimester: Secondary | ICD-10-CM

## 2013-07-27 DIAGNOSIS — O99330 Smoking (tobacco) complicating pregnancy, unspecified trimester: Secondary | ICD-10-CM

## 2013-07-27 DIAGNOSIS — O403XX0 Polyhydramnios, third trimester, not applicable or unspecified: Secondary | ICD-10-CM

## 2013-07-27 DIAGNOSIS — O09899 Supervision of other high risk pregnancies, unspecified trimester: Secondary | ICD-10-CM

## 2013-07-27 LAB — CULTURE, BETA STREP (GROUP B ONLY)

## 2013-07-27 LAB — GC/CHLAMYDIA PROBE AMP
CT PROBE, AMP APTIMA: NEGATIVE
GC PROBE AMP APTIMA: NEGATIVE

## 2013-07-31 ENCOUNTER — Ambulatory Visit (INDEPENDENT_AMBULATORY_CARE_PROVIDER_SITE_OTHER): Payer: Medicaid Other | Admitting: Family Medicine

## 2013-07-31 VITALS — BP 132/74 | HR 110 | Wt 284.0 lb

## 2013-07-31 DIAGNOSIS — O403XX0 Polyhydramnios, third trimester, not applicable or unspecified: Secondary | ICD-10-CM

## 2013-07-31 DIAGNOSIS — O3090 Multiple gestation, unspecified, unspecified trimester: Secondary | ICD-10-CM

## 2013-07-31 DIAGNOSIS — O409XX0 Polyhydramnios, unspecified trimester, not applicable or unspecified: Secondary | ICD-10-CM

## 2013-07-31 LAB — POCT URINALYSIS DIP (DEVICE)
Glucose, UA: NEGATIVE mg/dL
HGB URINE DIPSTICK: NEGATIVE
KETONES UR: NEGATIVE mg/dL
Leukocytes, UA: NEGATIVE
Nitrite: NEGATIVE
PH: 7 (ref 5.0–8.0)
PROTEIN: 30 mg/dL — AB
SPECIFIC GRAVITY, URINE: 1.025 (ref 1.005–1.030)
UROBILINOGEN UA: 0.2 mg/dL (ref 0.0–1.0)

## 2013-07-31 MED ORDER — OXYCODONE-ACETAMINOPHEN 10-325 MG PO TABS: 1.0000 | ORAL_TABLET | Freq: Two times a day (BID) | ORAL | Status: DC | PRN

## 2013-07-31 NOTE — Patient Instructions (Signed)
Third Trimester of Pregnancy The third trimester is from week 29 through week 42, months 7 through 9. The third trimester is a time when the fetus is growing rapidly. At the end of the ninth month, the fetus is about 20 inches in length and weighs 6-10 pounds.  BODY CHANGES Your body goes through many changes during pregnancy. The changes vary from woman to woman.   Your weight will continue to increase. You can expect to gain 25-35 pounds (11-16 kg) by the end of the pregnancy.  You may begin to get stretch marks on your hips, abdomen, and breasts.  You may urinate more often because the fetus is moving lower into your pelvis and pressing on your bladder.  You may develop or continue to have heartburn as a result of your pregnancy.  You may develop constipation because certain hormones are causing the muscles that push waste through your intestines to slow down.  You may develop hemorrhoids or swollen, bulging veins (varicose veins).  You may have pelvic pain because of the weight gain and pregnancy hormones relaxing your joints between the bones in your pelvis. Backaches may result from overexertion of the muscles supporting your posture.  You may have changes in your hair. These can include thickening of your hair, rapid growth, and changes in texture. Some women also have hair loss during or after pregnancy, or hair that feels dry or thin. Your hair will most likely return to normal after your baby is born.  Your breasts will continue to grow and be tender. A yellow discharge may leak from your breasts called colostrum.  Your belly button may stick out.  You may feel short of breath because of your expanding uterus.  You may notice the fetus "dropping," or moving lower in your abdomen.  You may have a bloody mucus discharge. This usually occurs a few days to a week before labor begins.  Your cervix becomes thin and soft (effaced) near your due date. WHAT TO EXPECT AT YOUR PRENATAL  EXAMS  You will have prenatal exams every 2 weeks until week 36. Then, you will have weekly prenatal exams. During a routine prenatal visit:  You will be weighed to make sure you and the fetus are growing normally.  Your blood pressure is taken.  Your abdomen will be measured to track your baby's growth.  The fetal heartbeat will be listened to.  Any test results from the previous visit will be discussed.  You may have a cervical check near your due date to see if you have effaced. At around 36 weeks, your caregiver will check your cervix. At the same time, your caregiver will also perform a test on the secretions of the vaginal tissue. This test is to determine if a type of bacteria, Group B streptococcus, is present. Your caregiver will explain this further. Your caregiver may ask you:  What your birth plan is.  How you are feeling.  If you are feeling the baby move.  If you have had any abnormal symptoms, such as leaking fluid, bleeding, severe headaches, or abdominal cramping.  If you have any questions. Other tests or screenings that may be performed during your third trimester include:  Blood tests that check for low iron levels (anemia).  Fetal testing to check the health, activity level, and growth of the fetus. Testing is done if you have certain medical conditions or if there are problems during the pregnancy. FALSE LABOR You may feel small, irregular contractions that   eventually go away. These are called Braxton Hicks contractions, or false labor. Contractions may last for hours, days, or even weeks before true labor sets in. If contractions come at regular intervals, intensify, or become painful, it is best to be seen by your caregiver.  SIGNS OF LABOR   Menstrual-like cramps.  Contractions that are 5 minutes apart or less.  Contractions that start on the top of the uterus and spread down to the lower abdomen and back.  A sense of increased pelvic pressure or back  pain.  A watery or bloody mucus discharge that comes from the vagina. If you have any of these signs before the 37th week of pregnancy, call your caregiver right away. You need to go to the hospital to get checked immediately. HOME CARE INSTRUCTIONS   Avoid all smoking, herbs, alcohol, and unprescribed drugs. These chemicals affect the formation and growth of the baby.  Follow your caregiver's instructions regarding medicine use. There are medicines that are either safe or unsafe to take during pregnancy.  Exercise only as directed by your caregiver. Experiencing uterine cramps is a good sign to stop exercising.  Continue to eat regular, healthy meals.  Wear a good support bra for breast tenderness.  Do not use hot tubs, steam rooms, or saunas.  Wear your seat belt at all times when driving.  Avoid raw meat, uncooked cheese, cat litter boxes, and soil used by cats. These carry germs that can cause birth defects in the baby.  Take your prenatal vitamins.  Try taking a stool softener (if your caregiver approves) if you develop constipation. Eat more high-fiber foods, such as fresh vegetables or fruit and whole grains. Drink plenty of fluids to keep your urine clear or pale yellow.  Take warm sitz baths to soothe any pain or discomfort caused by hemorrhoids. Use hemorrhoid cream if your caregiver approves.  If you develop varicose veins, wear support hose. Elevate your feet for 15 minutes, 3-4 times a day. Limit salt in your diet.  Avoid heavy lifting, wear low heal shoes, and practice good posture.  Rest a lot with your legs elevated if you have leg cramps or low back pain.  Visit your dentist if you have not gone during your pregnancy. Use a soft toothbrush to brush your teeth and be gentle when you floss.  A sexual relationship may be continued unless your caregiver directs you otherwise.  Do not travel far distances unless it is absolutely necessary and only with the approval  of your caregiver.  Take prenatal classes to understand, practice, and ask questions about the labor and delivery.  Make a trial run to the hospital.  Pack your hospital bag.  Prepare the baby's nursery.  Continue to go to all your prenatal visits as directed by your caregiver. SEEK MEDICAL CARE IF:  You are unsure if you are in labor or if your water has broken.  You have dizziness.  You have mild pelvic cramps, pelvic pressure, or nagging pain in your abdominal area.  You have persistent nausea, vomiting, or diarrhea.  You have a bad smelling vaginal discharge.  You have pain with urination. SEEK IMMEDIATE MEDICAL CARE IF:   You have a fever.  You are leaking fluid from your vagina.  You have spotting or bleeding from your vagina.  You have severe abdominal cramping or pain.  You have rapid weight loss or gain.  You have shortness of breath with chest pain.  You notice sudden or extreme swelling   of your face, hands, ankles, feet, or legs.  You have not felt your baby move in over an hour.  You have severe headaches that do not go away with medicine.  You have vision changes. Document Released: 01/20/2001 Document Revised: 01/31/2013 Document Reviewed: 03/29/2012 [employer] Patient Information 2015 [employer]. This information is not intended to replace advice given to you by your health care provider. Make sure you discuss any questions you have with your health care provider.  

## 2013-07-31 NOTE — Progress Notes (Signed)
S: 33 yo [id number] here for ROBV - doing well.  - +FM, no ctx, lof, vb.   O: see flowsheet  A/P Cont twice weekly testing for poly IOL set for 39 weeks F/u on Monday for NST CAT I tracing today Refill of chronic percocet given as per contract  BECK, Eugenia Pancoast, MD

## 2013-07-31 NOTE — Progress Notes (Signed)
Pt reports dally H/A's - has had throughout pregnancy. She denies visual disturbances. [place] for growth on 6/25 @ MFM.  IOL scheduled 7/2 @ 0730 per protocol.

## 2013-08-02 ENCOUNTER — Inpatient Hospital Stay (HOSPITAL_COMMUNITY)
Admission: AD | Admit: 2013-08-02 | Discharge: 2013-08-03 | Disposition: A | Discharge status: Home / Self Care | Payer: Medicaid Other | Source: Ambulatory Visit | Attending: Obstetrics & Gynecology | Admitting: Obstetrics & Gynecology

## 2013-08-02 ENCOUNTER — Encounter (HOSPITAL_COMMUNITY): Payer: Self-pay | Admitting: *Deleted

## 2013-08-02 DIAGNOSIS — O409XX0 Polyhydramnios, unspecified trimester, not applicable or unspecified: Secondary | ICD-10-CM | POA: Insufficient documentation

## 2013-08-02 DIAGNOSIS — O368390 Maternal care for abnormalities of the fetal heart rate or rhythm, unspecified trimester, not applicable or unspecified: Secondary | ICD-10-CM | POA: Insufficient documentation

## 2013-08-02 DIAGNOSIS — O99210 Obesity complicating pregnancy, unspecified trimester: Secondary | ICD-10-CM

## 2013-08-02 DIAGNOSIS — O3660X0 Maternal care for excessive fetal growth, unspecified trimester, not applicable or unspecified: Secondary | ICD-10-CM | POA: Insufficient documentation

## 2013-08-02 DIAGNOSIS — R109 Unspecified abdominal pain: Secondary | ICD-10-CM | POA: Insufficient documentation

## 2013-08-02 DIAGNOSIS — O479 False labor, unspecified: Secondary | ICD-10-CM

## 2013-08-02 DIAGNOSIS — E669 Obesity, unspecified: Secondary | ICD-10-CM | POA: Insufficient documentation

## 2013-08-02 DIAGNOSIS — O99330 Smoking (tobacco) complicating pregnancy, unspecified trimester: Secondary | ICD-10-CM | POA: Insufficient documentation

## 2013-08-02 NOTE — MAU Note (Addendum)
Patient presents to MAU with c/o contractions every 10 minutes that started at 4pm today. Cynthia Scott LOF or VB at this time. Reports Fetal movement. States scheduled for an induction on July 2nd.

## 2013-08-02 NOTE — Progress Notes (Signed)
Lynnda Child CNM called by RN to inform of FHR tracing and Late Decel; informed of RN interventions. CNM reviewed strip on phone with RN. No new orders received at this time. Per CNM recheck at 2230 and call with patient status.

## 2013-08-03 ENCOUNTER — Ambulatory Visit (HOSPITAL_COMMUNITY)
Admission: RE | Admit: 2013-08-03 | Discharge: 2013-08-03 | Disposition: A | Discharge status: Home / Self Care | Payer: Medicaid Other | Source: Ambulatory Visit | Attending: Obstetrics & Gynecology | Admitting: Obstetrics & Gynecology

## 2013-08-03 ENCOUNTER — Inpatient Hospital Stay (HOSPITAL_COMMUNITY): Payer: Medicaid Other

## 2013-08-03 ENCOUNTER — Ambulatory Visit (HOSPITAL_COMMUNITY): Payer: Medicaid Other

## 2013-08-03 ENCOUNTER — Other Ambulatory Visit: Payer: Self-pay | Admitting: Obstetrics & Gynecology

## 2013-08-03 DIAGNOSIS — Z3689 Encounter for other specified antenatal screening: Secondary | ICD-10-CM | POA: Diagnosis present

## 2013-08-03 DIAGNOSIS — R109 Unspecified abdominal pain: Secondary | ICD-10-CM | POA: Diagnosis not present

## 2013-08-03 DIAGNOSIS — O0990 Supervision of high risk pregnancy, unspecified, unspecified trimester: Secondary | ICD-10-CM

## 2013-08-03 DIAGNOSIS — O09899 Supervision of other high risk pregnancies, unspecified trimester: Secondary | ICD-10-CM | POA: Diagnosis not present

## 2013-08-03 DIAGNOSIS — O409XX0 Polyhydramnios, unspecified trimester, not applicable or unspecified: Secondary | ICD-10-CM | POA: Diagnosis not present

## 2013-08-03 DIAGNOSIS — O479 False labor, unspecified: Secondary | ICD-10-CM

## 2013-08-03 DIAGNOSIS — E669 Obesity, unspecified: Secondary | ICD-10-CM | POA: Diagnosis not present

## 2013-08-03 DIAGNOSIS — O368390 Maternal care for abnormalities of the fetal heart rate or rhythm, unspecified trimester, not applicable or unspecified: Secondary | ICD-10-CM | POA: Diagnosis not present

## 2013-08-03 DIAGNOSIS — O3660X0 Maternal care for excessive fetal growth, unspecified trimester, not applicable or unspecified: Secondary | ICD-10-CM | POA: Diagnosis not present

## 2013-08-03 DIAGNOSIS — O99330 Smoking (tobacco) complicating pregnancy, unspecified trimester: Secondary | ICD-10-CM | POA: Diagnosis not present

## 2013-08-03 DIAGNOSIS — Z302 Encounter for sterilization: Secondary | ICD-10-CM

## 2013-08-03 MED ORDER — ZOLPIDEM TARTRATE 5 MG PO TABS
5.0000 mg | ORAL_TABLET | Freq: Once | ORAL | Status: AC
  Administered 2013-08-03: 5 mg via ORAL
  Filled 2013-08-03: qty 1

## 2013-08-03 NOTE — MAU Provider Note (Signed)
History     CSN: [id number]  Arrival date and time: 08/02/13 2149   First Provider Initiated Contact with Patient 08/03/13 0228      Chief Complaint  Patient presents with  . Labor Eval   HPI Cynthia Scott is a 33yo G11 [id number] @ 38.0wks who presents for eval of abd cramping. Denies leaking or bldg. Her preg has been followed by the [facility] and has been remarkable for 1) morbidly obese 2) poly 3) LGA >90% 4) grandmultip. She is scheduled for an IOL on 08/10/13.  OB History   Grav Para Term Preterm Abortions TAB SAB Ect Mult Living   11 6 6  4  0 4   6      Past Medical History  Diagnosis Date  . Blood transfusion 01/25/2009  . Urinary tract infection   . 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 Anemia   . Non-Hodgkin lymphoma   . 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 Cancer   . Non Hodgkin's lymphoma 2012  . Hodgkins lymphoma     pt sttes she was supposed to get treatment but showed up pregnant  . Obesity complicating pregnancy in third trimester   . Pregnancy induced hypertension   . Blood transfusion without reported diagnosis   . Weight gain 12/21/2012  . Dysphagia 01/11/2013  . Anemia, unspecified 01/11/2013    Past Surgical History  Procedure Laterality Date  . Rhinoplasty    . Port-a-cath removal    . Chest wall tumor excision    . Tonsillectomy    . Wisdom tooth extraction  2010  . Appendectomy    . Cholecystectomy    . Tubes in ears    . Turmor in chest      Family History  Problem Relation Age of Onset  . Anesthesia problems Neg Hx   . Heart disease Mother   . COPD Mother   . Diabetes Mother   . Heart disease Maternal Aunt   . Diabetes Maternal Aunt   . Cancer Maternal Uncle     colon cancer  . Heart disease Maternal Grandmother     History  Substance Use Topics  . Smoking status: Current Every Day Smoker -- 0.50 packs/day for 13 years    Types: Cigarettes  . Smokeless tobacco: Never Used  . Alcohol Use: No    Allergies: No Known  Allergies  Prescriptions prior to admission  Medication Sig Dispense Refill  . cyclobenzaprine (FLEXERIL) 10 MG tablet Take 1 tablet (10 mg total) by mouth 3 (three) times daily as needed for muscle spasms.  60 tablet  2  . diphenhydrAMINE (BENADRYL) spray Apply 1 application topically every 4 (four) hours as needed for itching (insect bites).  60 mL  0  . flintstones complete (FLINTSTONES) 60 MG chewable tablet Chew 2 tablets by mouth daily.      Marland Kitchen oxyCODONE-acetaminophen (PERCOCET) 10-325 MG per tablet Take 1 tablet by mouth 2 (two) times daily as needed for pain.  30 tablet  0    ROS Physical Exam   Blood pressure 120/65, pulse 103, temperature 98.2 F (36.8 C), temperature source Oral, resp. rate 20, height 5\' 2"  (1.575 m), weight 128.822 kg (284 lb), SpO2 99.00%.  Physical Exam  Constitutional: She is oriented to person, place, and time.  Morbidly obese  HENT:  Head: Normocephalic.  Neck: Normal range of motion.  Cardiovascular: Normal rate.   Respiratory: Effort normal.  Genitourinary: Vagina normal.  Cx anterior 1-2/50/-3 (unchanged over  hours)   Musculoskeletal: Normal range of motion.  Neurological: She is alert and oriented to person, place, and time.  Skin: Skin is warm and dry.  Psychiatric: She has a normal mood and affect. Her behavior is normal. Thought content normal.   BPP 6/8 (no breathing)  MAU Course  Procedures  Pt was observed over a few hours; initially to r/o labor, and then once ctx spaced out there was a need to verify fetal status. Fetal testing is reassuring.  Assessment and Plan  IUP @ 38.0wks Braxton Hicks ctx Polyhydramnios LGA >90%  D/C home- rev'd with Dr Elonda Husky Has appt later this AM Given Ambien 10mg  prior to d/c IOL for 7/2  SHAW, Malcolm 08/03/2013, 2:32 AM

## 2013-08-03 NOTE — Discharge Instructions (Signed)
Braxton Hicks Contractions °Contractions of the uterus can occur throughout pregnancy. Contractions are not always a sign that you are in labor.  °WHAT ARE BRAXTON HICKS CONTRACTIONS?  °Contractions that occur before labor are called Braxton Hicks contractions, or false labor. Toward the end of pregnancy (32-34 weeks), these contractions can develop more often and may become more forceful. This is not true labor because these contractions do not result in opening (dilatation) and thinning of the cervix. They are sometimes difficult to tell apart from true labor because these contractions can be forceful and people have different pain tolerances. You should not feel embarrassed if you go to the hospital with false labor. Sometimes, the only way to tell if you are in true labor is for your health care provider to look for changes in the cervix. °If there are no prenatal problems or other health problems associated with the pregnancy, it is completely safe to be sent home with false labor and await the onset of true labor. °HOW CAN YOU TELL THE DIFFERENCE BETWEEN TRUE AND FALSE LABOR? °False Labor °· The contractions of false labor are usually shorter and not as hard as those of true labor.   °· The contractions are usually irregular.   °· The contractions are often felt in the front of the lower abdomen and in the groin.   °· The contractions may go away when you walk around or change positions while lying down.   °· The contractions get weaker and are shorter lasting as time goes on.   °· The contractions do not usually become progressively stronger, regular, and closer together as with true labor.   °True Labor °· Contractions in true labor last 30-70 seconds, become very regular, usually become more intense, and increase in frequency.   °· The contractions do not go away with walking.   °· The discomfort is usually felt in the top of the uterus and spreads to the lower abdomen and low back.   °· True labor can be  determined by your health care provider with an exam. This will show that the cervix is dilating and getting thinner.   °WHAT TO REMEMBER °· Keep up with your usual exercises and follow other instructions given by your health care provider.   °· Take medicines as directed by your health care provider.   °· Keep your regular prenatal appointments.   °· Eat and drink lightly if you think you are going into labor.   °· If Braxton Hicks contractions are making you uncomfortable:   °¨ Change your position from lying down or resting to walking, or from walking to resting.   °¨ Sit and rest in a tub of warm water.   °¨ Drink 2-3 glasses of water. Dehydration may cause these contractions.   °¨ Do slow and deep breathing several times an hour.   °WHEN SHOULD I SEEK IMMEDIATE MEDICAL CARE? °Seek immediate medical care if: °· Your contractions become stronger, more regular, and closer together.   °· You have fluid leaking or gushing from your vagina.   °· You have a fever.   °· You pass blood-tinged mucus.   °· You have vaginal bleeding.   °· You have continuous abdominal pain.   °· You have low back pain that you never had before.   °· You feel your baby's head pushing down and causing pelvic pressure.   °· Your baby is not moving as much as it used to.   °Document Released: 01/26/2005 Document Revised: 01/31/2013 Document Reviewed: 11/07/2012 °Exit[employer]® Patient Information ©2015 [employer]. This information is not intended to replace advice given to you by your health care   provider. Make sure you discuss any questions you have with your health care provider. ° °

## 2013-08-04 ENCOUNTER — Telehealth (HOSPITAL_COMMUNITY): Payer: Self-pay | Admitting: *Deleted

## 2013-08-04 NOTE — Telephone Encounter (Signed)
Preadmission screen  

## 2013-08-07 ENCOUNTER — Ambulatory Visit (INDEPENDENT_AMBULATORY_CARE_PROVIDER_SITE_OTHER): Payer: Medicaid Other | Admitting: Family Medicine

## 2013-08-07 VITALS — BP 120/76 | HR 108 | Wt 287.6 lb

## 2013-08-07 DIAGNOSIS — O409XX0 Polyhydramnios, unspecified trimester, not applicable or unspecified: Secondary | ICD-10-CM

## 2013-08-07 DIAGNOSIS — O3090 Multiple gestation, unspecified, unspecified trimester: Secondary | ICD-10-CM

## 2013-08-07 DIAGNOSIS — O403XX1 Polyhydramnios, third trimester, fetus 1: Secondary | ICD-10-CM

## 2013-08-07 NOTE — Progress Notes (Signed)
IOL scheduled 7/2.  [place] for growth done 6/25.

## 2013-08-07 NOTE — Patient Instructions (Signed)
Third Trimester of Pregnancy The third trimester is from week 29 through week 42, months 7 through 9. The third trimester is a time when the fetus is growing rapidly. At the end of the ninth month, the fetus is about 20 inches in length and weighs 6-10 pounds.  BODY CHANGES Your body goes through many changes during pregnancy. The changes vary from woman to woman.   Your weight will continue to increase. You can expect to gain 25-35 pounds (11-16 kg) by the end of the pregnancy.  You may begin to get stretch marks on your hips, abdomen, and breasts.  You may urinate more often because the fetus is moving lower into your pelvis and pressing on your bladder.  You may develop or continue to have heartburn as a result of your pregnancy.  You may develop constipation because certain hormones are causing the muscles that push waste through your intestines to slow down.  You may develop hemorrhoids or swollen, bulging veins (varicose veins).  You may have pelvic pain because of the weight gain and pregnancy hormones relaxing your joints between the bones in your pelvis. Backaches may result from overexertion of the muscles supporting your posture.  You may have changes in your hair. These can include thickening of your hair, rapid growth, and changes in texture. Some women also have hair loss during or after pregnancy, or hair that feels dry or thin. Your hair will most likely return to normal after your baby is born.  Your breasts will continue to grow and be tender. A yellow discharge may leak from your breasts called colostrum.  Your belly button may stick out.  You may feel short of breath because of your expanding uterus.  You may notice the fetus "dropping," or moving lower in your abdomen.  You may have a bloody mucus discharge. This usually occurs a few days to a week before labor begins.  Your cervix becomes thin and soft (effaced) near your due date. WHAT TO EXPECT AT YOUR PRENATAL  EXAMS  You will have prenatal exams every 2 weeks until week 36. Then, you will have weekly prenatal exams. During a routine prenatal visit:  You will be weighed to make sure you and the fetus are growing normally.  Your blood pressure is taken.  Your abdomen will be measured to track your baby's growth.  The fetal heartbeat will be listened to.  Any test results from the previous visit will be discussed.  You may have a cervical check near your due date to see if you have effaced. At around 36 weeks, your caregiver will check your cervix. At the same time, your caregiver will also perform a test on the secretions of the vaginal tissue. This test is to determine if a type of bacteria, Group B streptococcus, is present. Your caregiver will explain this further. Your caregiver may ask you:  What your birth plan is.  How you are feeling.  If you are feeling the baby move.  If you have had any abnormal symptoms, such as leaking fluid, bleeding, severe headaches, or abdominal cramping.  If you have any questions. Other tests or screenings that may be performed during your third trimester include:  Blood tests that check for low iron levels (anemia).  Fetal testing to check the health, activity level, and growth of the fetus. Testing is done if you have certain medical conditions or if there are problems during the pregnancy. FALSE LABOR You may feel small, irregular contractions that   eventually go away. These are called Braxton Hicks contractions, or false labor. Contractions may last for hours, days, or even weeks before true labor sets in. If contractions come at regular intervals, intensify, or become painful, it is best to be seen by your caregiver.  SIGNS OF LABOR   Menstrual-like cramps.  Contractions that are 5 minutes apart or less.  Contractions that start on the top of the uterus and spread down to the lower abdomen and back.  A sense of increased pelvic pressure or back  pain.  A watery or bloody mucus discharge that comes from the vagina. If you have any of these signs before the 37th week of pregnancy, call your caregiver right away. You need to go to the hospital to get checked immediately. HOME CARE INSTRUCTIONS   Avoid all smoking, herbs, alcohol, and unprescribed drugs. These chemicals affect the formation and growth of the baby.  Follow your caregiver's instructions regarding medicine use. There are medicines that are either safe or unsafe to take during pregnancy.  Exercise only as directed by your caregiver. Experiencing uterine cramps is a good sign to stop exercising.  Continue to eat regular, healthy meals.  Wear a good support bra for breast tenderness.  Do not use hot tubs, steam rooms, or saunas.  Wear your seat belt at all times when driving.  Avoid raw meat, uncooked cheese, cat litter boxes, and soil used by cats. These carry germs that can cause birth defects in the baby.  Take your prenatal vitamins.  Try taking a stool softener (if your caregiver approves) if you develop constipation. Eat more high-fiber foods, such as fresh vegetables or fruit and whole grains. Drink plenty of fluids to keep your urine clear or pale yellow.  Take warm sitz baths to soothe any pain or discomfort caused by hemorrhoids. Use hemorrhoid cream if your caregiver approves.  If you develop varicose veins, wear support hose. Elevate your feet for 15 minutes, 3-4 times a day. Limit salt in your diet.  Avoid heavy lifting, wear low heal shoes, and practice good posture.  Rest a lot with your legs elevated if you have leg cramps or low back pain.  Visit your dentist if you have not gone during your pregnancy. Use a soft toothbrush to brush your teeth and be gentle when you floss.  A sexual relationship may be continued unless your caregiver directs you otherwise.  Do not travel far distances unless it is absolutely necessary and only with the approval  of your caregiver.  Take prenatal classes to understand, practice, and ask questions about the labor and delivery.  Make a trial run to the hospital.  Pack your hospital bag.  Prepare the baby's nursery.  Continue to go to all your prenatal visits as directed by your caregiver. SEEK MEDICAL CARE IF:  You are unsure if you are in labor or if your water has broken.  You have dizziness.  You have mild pelvic cramps, pelvic pressure, or nagging pain in your abdominal area.  You have persistent nausea, vomiting, or diarrhea.  You have a bad smelling vaginal discharge.  You have pain with urination. SEEK IMMEDIATE MEDICAL CARE IF:   You have a fever.  You are leaking fluid from your vagina.  You have spotting or bleeding from your vagina.  You have severe abdominal cramping or pain.  You have rapid weight loss or gain.  You have shortness of breath with chest pain.  You notice sudden or extreme swelling   of your face, hands, ankles, feet, or legs.  You have not felt your baby move in over an hour.  You have severe headaches that do not go away with medicine.  You have vision changes. Document Released: 01/20/2001 Document Revised: 01/31/2013 Document Reviewed: 03/29/2012 [employer] Patient Information 2015 [employer]. This information is not intended to replace advice given to you by your health care provider. Make sure you discuss any questions you have with your health care provider.  

## 2013-08-07 NOTE — Progress Notes (Signed)
S: 33 yo [id number] @ [redacted]w[redacted]d here for ROBV.  - uncomfortable.  - scheduled for induction on Thursday for poly.  - doing well.    O: see flowsheet  A/P - NST reviewed and cat I - pregnancy complicated by LGA, poly, chronic opiate use and multiparity.   - keep scheduled induction appt on Thursday at 39 weeks.

## 2013-08-10 ENCOUNTER — Encounter (HOSPITAL_COMMUNITY): Payer: Self-pay

## 2013-08-10 ENCOUNTER — Inpatient Hospital Stay (HOSPITAL_COMMUNITY)
Admission: RE | Admit: 2013-08-10 | Discharge: 2013-08-12 | DRG: 767 | Disposition: A | Discharge status: Home / Self Care | Payer: Medicaid Other | Source: Ambulatory Visit | Attending: Obstetrics & Gynecology | Admitting: Obstetrics & Gynecology

## 2013-08-10 VITALS — BP 124/77 | HR 88 | Temp 98.0°F | Resp 20 | Ht 62.0 in | Wt 288.0 lb

## 2013-08-10 DIAGNOSIS — Z302 Encounter for sterilization: Secondary | ICD-10-CM

## 2013-08-10 DIAGNOSIS — E669 Obesity, unspecified: Secondary | ICD-10-CM | POA: Diagnosis present

## 2013-08-10 DIAGNOSIS — O409XX0 Polyhydramnios, unspecified trimester, not applicable or unspecified: Principal | ICD-10-CM | POA: Diagnosis present

## 2013-08-10 DIAGNOSIS — Z833 Family history of diabetes mellitus: Secondary | ICD-10-CM

## 2013-08-10 DIAGNOSIS — Z800 Family history of malignant neoplasm of digestive organs: Secondary | ICD-10-CM

## 2013-08-10 DIAGNOSIS — D649 Anemia, unspecified: Secondary | ICD-10-CM | POA: Diagnosis present

## 2013-08-10 DIAGNOSIS — O99214 Obesity complicating childbirth: Secondary | ICD-10-CM

## 2013-08-10 DIAGNOSIS — O99334 Smoking (tobacco) complicating childbirth: Secondary | ICD-10-CM | POA: Diagnosis present

## 2013-08-10 DIAGNOSIS — O1002 Pre-existing essential hypertension complicating childbirth: Secondary | ICD-10-CM | POA: Diagnosis present

## 2013-08-10 DIAGNOSIS — O3660X0 Maternal care for excessive fetal growth, unspecified trimester, not applicable or unspecified: Secondary | ICD-10-CM | POA: Diagnosis present

## 2013-08-10 DIAGNOSIS — O9902 Anemia complicating childbirth: Secondary | ICD-10-CM | POA: Diagnosis present

## 2013-08-10 DIAGNOSIS — Z87898 Personal history of other specified conditions: Secondary | ICD-10-CM

## 2013-08-10 DIAGNOSIS — O0940 Supervision of pregnancy with grand multiparity, unspecified trimester: Secondary | ICD-10-CM

## 2013-08-10 DIAGNOSIS — Z6841 Body Mass Index (BMI) 40.0 and over, adult: Secondary | ICD-10-CM | POA: Diagnosis not present

## 2013-08-10 DIAGNOSIS — Z8249 Family history of ischemic heart disease and other diseases of the circulatory system: Secondary | ICD-10-CM

## 2013-08-10 DIAGNOSIS — Z79899 Other long term (current) drug therapy: Secondary | ICD-10-CM

## 2013-08-10 LAB — CBC
HCT: 35.4 % — ABNORMAL LOW (ref 36.0–46.0)
Hemoglobin: 11.6 g/dL — ABNORMAL LOW (ref 12.0–15.0)
MCH: 28.6 pg (ref 26.0–34.0)
MCHC: 32.8 g/dL (ref 30.0–36.0)
MCV: 87.4 fL (ref 78.0–100.0)
PLATELETS: 313 10*3/uL (ref 150–400)
RBC: 4.05 MIL/uL (ref 3.87–5.11)
RDW: 15 % (ref 11.5–15.5)
WBC: 9.7 10*3/uL (ref 4.0–10.5)

## 2013-08-10 LAB — PREPARE RBC (CROSSMATCH)

## 2013-08-10 LAB — RPR

## 2013-08-10 MED ORDER — ONDANSETRON HCL 4 MG/2ML IJ SOLN
4.0000 mg | Freq: Four times a day (QID) | INTRAMUSCULAR | Status: DC | PRN
Start: 2013-08-10 — End: 2013-08-11

## 2013-08-10 MED ORDER — LACTATED RINGERS IV SOLN
INTRAVENOUS | Status: DC
  Administered 2013-08-10 (×2): via INTRAVENOUS

## 2013-08-10 MED ORDER — OXYTOCIN 40 UNITS IN LACTATED RINGERS INFUSION - SIMPLE MED
1.0000 m[IU]/min | INTRAVENOUS | Status: DC
  Administered 2013-08-10: 2 m[IU]/min via INTRAVENOUS

## 2013-08-10 MED ORDER — FENTANYL CITRATE 0.05 MG/ML IJ SOLN
100.0000 ug | INTRAMUSCULAR | Status: DC | PRN
  Administered 2013-08-10 (×5): 100 ug via INTRAVENOUS
  Filled 2013-08-10 (×5): qty 2

## 2013-08-10 MED ORDER — OXYTOCIN BOLUS FROM INFUSION: 500.0000 mL | INTRAVENOUS | Status: DC

## 2013-08-10 MED ORDER — LACTATED RINGERS IV SOLN: 500.0000 mL | INTRAVENOUS | Status: DC | PRN

## 2013-08-10 MED ORDER — OXYTOCIN 40 UNITS IN LACTATED RINGERS INFUSION - SIMPLE MED
62.5000 mL/h | INTRAVENOUS | Status: DC
  Administered 2013-08-10: 62.5 mL/h via INTRAVENOUS

## 2013-08-10 MED ORDER — IBUPROFEN 600 MG PO TABS
600.0000 mg | ORAL_TABLET | Freq: Four times a day (QID) | ORAL | Status: DC | PRN
Start: 2013-08-10 — End: 2013-08-11
  Administered 2013-08-10: 600 mg via ORAL
  Filled 2013-08-10: qty 1

## 2013-08-10 MED ORDER — LIDOCAINE HCL (PF) 1 % IJ SOLN
30.0000 mL | INTRAMUSCULAR | Status: DC | PRN
  Filled 2013-08-10: qty 30

## 2013-08-10 MED ORDER — ACETAMINOPHEN 325 MG PO TABS: 650.0000 mg | ORAL_TABLET | ORAL | Status: DC | PRN

## 2013-08-10 MED ORDER — OXYCODONE-ACETAMINOPHEN 5-325 MG PO TABS
1.0000 | ORAL_TABLET | ORAL | Status: DC | PRN
  Administered 2013-08-10: 2 via ORAL
  Filled 2013-08-10: qty 2

## 2013-08-10 MED ORDER — OXYTOCIN 40 UNITS IN LACTATED RINGERS INFUSION - SIMPLE MED
INTRAVENOUS | Status: AC
  Filled 2013-08-10: qty 1000

## 2013-08-10 MED ORDER — CITRIC ACID-SODIUM CITRATE 334-500 MG/5ML PO SOLN
30.0000 mL | ORAL | Status: DC | PRN
Start: 2013-08-10 — End: 2013-08-11

## 2013-08-10 MED ORDER — FLEET ENEMA 7-19 GM/118ML RE ENEM
1.0000 | ENEMA | RECTAL | Status: DC | PRN
Start: 2013-08-10 — End: 2013-08-11

## 2013-08-10 MED ORDER — TERBUTALINE SULFATE 1 MG/ML IJ SOLN: 0.2500 mg | Freq: Once | INTRAMUSCULAR | Status: AC | PRN

## 2013-08-10 NOTE — Progress Notes (Signed)
Cynthia Scott is a 33 y.o. [id number] at [redacted]w[redacted]d admitted for induction of labor due to poly.  Subjective:  Wanting her water broken. +FM.    Objective: BP 125/69  Pulse 88  Temp(Src) 97.7 F (36.5 C) (Oral)  Resp 18  Ht 5\' 2"  (1.575 m)  Wt 130.636 kg (288 lb)  BMI 52.66 kg/m2      FHT:  FHR: 135 bpm, variability: moderate,  accelerations:  Present,  decelerations:  Absent UC:   regular, every 2-4 minutes SVE:   Dilation: 4 Effacement (%): 50 Station: -3 Exam by:: Dr. Olevia Bowens  Labs: Lab Results  Component Value Date   WBC 9.7 08/10/2013   HGB 11.6* 08/10/2013   HCT 35.4* 08/10/2013   MCV 87.4 08/10/2013   PLT 313 08/10/2013    Assessment / Plan: IOL for poly  Labor: AROM performed with copious amounts of clear fluid.  cont to increase pit Fetal Wellbeing:  Category I Pain Control:  Fentanyl I/D:  n/a Anticipated MOD:  NSVD  Chirsty Armistead L 08/10/2013, 2:59 PM

## 2013-08-10 NOTE — Progress Notes (Signed)
Cynthia Scott is a 33 y.o. [id number] at [redacted]w[redacted]d admitted for induction of labor due to poly.  Subjective:  Doing well. Starting to hurt more with ctx. +FM   Objective: BP 113/76  Pulse 76  Temp(Src) 97.7 F (36.5 C) (Axillary)  Resp 18  Ht 5\' 2"  (1.575 m)  Wt 130.636 kg (288 lb)  BMI 52.66 kg/m2      FHT:  FHR: 125 bpm, variability: moderate,  accelerations:  Present,  decelerations:  Present early UC:   Difficult to trace  SVE:   Dilation: 4 Effacement (%): 50 Station: -2 Exam by:: Cynthia Web, md  Labs: Lab Results  Component Value Date   WBC 9.7 08/10/2013   HGB 11.6* 08/10/2013   HCT 35.4* 08/10/2013   MCV 87.4 08/10/2013   PLT 313 08/10/2013    Assessment / Plan: IOL for poly.   Labor: progressing slowly. FSE and IUPC placed without difficulty Fetal Wellbeing:  Category I Pain Control:  Epidural I/D:  n/a Anticipated MOD:  NSVD  Cynthia Scott L 08/10/2013, 8:29 PM

## 2013-08-10 NOTE — Progress Notes (Signed)
Patient ambulated without difficulty to bathroom

## 2013-08-10 NOTE — Progress Notes (Signed)
BP cuff continuously inflating, asked to remove.

## 2013-08-10 NOTE — H&P (Signed)
Cynthia Scott is a 33 y.o. female [phone] with IUP at [redacted]w[redacted]d presenting for Induction of Labor due to polyhydramnios. Pt states she has been having no contractions, associated with none vaginal bleeding.  Membranes are intact, with active fetal movement.    [facility] at [facility] since  13 wks  Prenatal History/Complications: Pt. Is a 33 y/o F [id number] with hx of non-hodgkin's lymphoma dx 2010, [place] multiparity, and IOL with all of her previous pregnancies. She states that she has previously been induced for elevated blood pressures without progression to pre-eclampsia. She currently has polyhydramnios in this pregnancy, and is here for induction due to this complication. As far as her hx of Non-hodgkin's lymphoma, she was diagnosed in 2010 and subsequently received chemotherapy, however her therapy has been abbreviated due to frequent pregnancy. She is a current 1ppd smoker, no hx of alcohol use during pregnancy. She denies LOF, vaginal bleeding, contractions, or pain at this time. She has no other complaints.   Past Medical History: Past Medical History  Diagnosis Date  . Blood transfusion 01/25/2009  . Urinary tract infection   . 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 Anemia   . Non-Hodgkin lymphoma   . 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 Cancer   . Non Hodgkin's lymphoma 2012  . Hodgkins lymphoma     pt sttes she was supposed to get treatment but showed up pregnant  . Obesity complicating pregnancy in third trimester   . Pregnancy induced hypertension   . Blood transfusion without reported diagnosis   . Weight gain 12/21/2012  . Dysphagia 01/11/2013  . Anemia, unspecified 01/11/2013    Past Surgical History: Past Surgical History  Procedure Laterality Date  . Rhinoplasty    . Port-a-cath removal    . Chest wall tumor excision    . Tonsillectomy    . Wisdom tooth extraction  2010  . Appendectomy    . Cholecystectomy    . Tubes in ears    . Turmor in chest      Obstetrical  History: OB History   Grav Para Term Preterm Abortions TAB SAB Ect Mult Living   11 6 6  4  0 4   6     G1- term, NSVD, 7lb14oz G2- term, NSVD, 9lb 12oz G3- term, NSVD G4- term, NSVD G5- term, NSVD G6-G9- SAB G10- term, NSVD G11- current   Gynecological History: OB History   Grav Para Term Preterm Abortions TAB SAB Ect Mult Living   11 6 6  4  0 4   6      Social History: History   Social History  . Marital Status: Married    Spouse Name: N/A    Number of Children: N/A  . Years of Education: N/A   Social History Main Topics  . Smoking status: Current Every Day Smoker -- 0.50 packs/day for 13 years    Types: Cigarettes  . Smokeless tobacco: Never Used  . Alcohol Use: No  . Drug Use: No  . Sexual Activity: Not Currently    Birth Control/ Protection: None     Comment: post partum   Other Topics Concern  . None   Social History Narrative   ** Merged History Encounter **        Family History: Family History  Problem Relation Age of Onset  . Anesthesia problems Neg Hx   . Heart disease Mother   . COPD Mother   . Diabetes Mother   .  Heart disease Maternal Aunt   . Diabetes Maternal Aunt   . Cancer Maternal Uncle     colon cancer  . Heart disease Maternal Grandmother     Allergies: No Known Allergies  Prescriptions prior to admission  Medication Sig Dispense Refill  . cyclobenzaprine (FLEXERIL) 10 MG tablet Take 1 tablet (10 mg total) by mouth 3 (three) times daily as needed for muscle spasms.  60 tablet  2  . diphenhydrAMINE (BENADRYL) spray Apply 1 application topically every 4 (four) hours as needed for itching (insect bites).  60 mL  0  . flintstones complete (FLINTSTONES) 60 MG chewable tablet Chew 2 tablets by mouth daily.      Marland Kitchen oxyCODONE-acetaminophen (PERCOCET) 10-325 MG per tablet Take 1 tablet by mouth 2 (two) times daily as needed for pain.  30 tablet  0     Review of Systems   Constitutional: See HPI above.   Blood pressure 110/61,  pulse 94, temperature 97.7 F (36.5 C), temperature source Oral, resp. rate 87. General appearance: alert, cooperative and no distress Lungs: clear to auscultation bilaterally Heart: regular rate and rhythm Abdomen: soft, non-tender; bowel sounds normal, Appropriate for Gestational Age.  Pelvic: 3cm, 50% Effacement, -3 Extremities: Homans sign is negative, no sign of DVT Neurologically: grossly intact.  Presentation: cephalic Fetal monitoringBaseline: 130's bpm, Variability: Good {> 6 bpm) and Accelerations: Reactive Uterine activityFrequency: 1 times per hour Dilation: 3 Effacement (%): 50 Station: -3 Exam by:: Dr. Minda Ditto   Prenatal labs: ABO, Rh: --/--/A POS (07/02 0740) Antibody: PENDING (07/02 0740) Rubella:   RPR: NON REAC (04/13 1145)  HBsAg: NEGATIVE (11/13 1603)  HIV: NONREACTIVE (04/13 1145)  GBS: Negative (06/15 0000)    Clinic HRC  Dating Ultrasound:  6 weeks        Genetic Screen Quad is negative  Anatomic [place] Normal except for inadequate spine view [x ] Rescan at 24 weeks  GTT Early:    88           Third trimester: 124  TDaP vaccine 07/17/13  Flu vaccine refused  GBS neg  Baby Food  bottle  Contraception  BTL  Circumcision  n/a  Pediatrician  [facility]   [facility]    Prenatal Transfer Tool  Maternal Diabetes: No Genetic Screening: Normal Maternal Ultrasounds/Referrals: Normal- LGA Fetal Ultrasounds or other Referrals:  None Maternal Substance Abuse:  Yes:  Type: Smoker Significant Maternal Medications:  Meds include: Other: Percocet 10mg  qd.  Significant Maternal Lab Results: Lab values include: Group B Strep negative     Results for orders placed during the hospital encounter of 08/10/13 (from the past 24 hour(s))  CBC   Collection Time    08/10/13  7:20 AM      Result Value Ref Range   WBC 9.7  4.0 - 10.5 K/uL   RBC 4.05  3.87 - 5.11 MIL/uL   Hemoglobin 11.6 (*) 12.0 - 15.0 g/dL   HCT 35.4 (*) 36.0 - 46.0 %   MCV 87.4  78.0 -  100.0 fL   MCH 28.6  26.0 - 34.0 pg   MCHC 32.8  30.0 - 36.0 g/dL   RDW 15.0  11.5 - 15.5 %   Platelets 313  150 - 400 K/uL  TYPE AND SCREEN   Collection Time    08/10/13  7:40 AM      Result Value Ref Range   ABO/RH(D) A POS     Antibody Screen PENDING     Sample Expiration 08/13/2013  Assessment: Cynthia Scott is a 33 y.o. [id number] at [redacted]w[redacted]d by 6wk [place] here for IOL for polyhydramnios and with a suspected LGA fetus.   #Labor: IOL with pitocin with expectation of NSVD. #Pain: Fentanyl, does not desire epidural #FWB: Cat I #ID:  No prophylaxis required at this time.  #MOF: Breast feeding #MOC: BTL #Circ:  Will discuss  Melancon, Caleb G 08/10/2013, 8:55 AM  I have seen and examined this patient and agree with above documentation in the resident's note. 33 yo [id number] who is here for IOL at  [redacted]w[redacted]d for polyhydramnios.  Pregnancy complicated by chronic narcotic use and LGA baby (EFW 9lb15oz at 38wks). Anticipated SVD.   Ebbie Latus, M.D. [facility] Fellow 08/10/2013 9:52 AM

## 2013-08-10 NOTE — Progress Notes (Signed)
Patient requested no vaginal exam.

## 2013-08-11 ENCOUNTER — Encounter (HOSPITAL_COMMUNITY)
Admission: RE | Disposition: A | Discharge status: Home / Self Care | Payer: Self-pay | Source: Ambulatory Visit | Attending: Obstetrics & Gynecology

## 2013-08-11 ENCOUNTER — Inpatient Hospital Stay (HOSPITAL_COMMUNITY): Payer: Medicaid Other | Admitting: Anesthesiology

## 2013-08-11 ENCOUNTER — Encounter (HOSPITAL_COMMUNITY): Payer: Medicaid Other | Admitting: Anesthesiology

## 2013-08-11 ENCOUNTER — Encounter (HOSPITAL_COMMUNITY): Payer: Self-pay

## 2013-08-11 DIAGNOSIS — Z302 Encounter for sterilization: Secondary | ICD-10-CM

## 2013-08-11 HISTORY — PX: TUBAL LIGATION: SHX77

## 2013-08-11 LAB — SURGICAL PCR SCREEN
MRSA, PCR: NEGATIVE
Staphylococcus aureus: POSITIVE — AB

## 2013-08-11 SURGERY — LIGATION, FALLOPIAN TUBE, POSTPARTUM
Anesthesia: Spinal | Site: Abdomen

## 2013-08-11 MED ORDER — SENNOSIDES-DOCUSATE SODIUM 8.6-50 MG PO TABS
2.0000 | ORAL_TABLET | ORAL | Status: DC
  Administered 2013-08-11: 2 via ORAL
  Filled 2013-08-11: qty 2

## 2013-08-11 MED ORDER — ONDANSETRON HCL 4 MG/2ML IJ SOLN
INTRAMUSCULAR | Status: DC | PRN
  Administered 2013-08-11: 4 mg via INTRAVENOUS

## 2013-08-11 MED ORDER — METOCLOPRAMIDE HCL 10 MG PO TABS
10.0000 mg | ORAL_TABLET | Freq: Once | ORAL | Status: AC
  Administered 2013-08-11: 10 mg via ORAL
  Filled 2013-08-11: qty 1

## 2013-08-11 MED ORDER — ONDANSETRON HCL 4 MG PO TABS: 4.0000 mg | ORAL_TABLET | ORAL | Status: DC | PRN

## 2013-08-11 MED ORDER — MIDAZOLAM HCL 2 MG/2ML IJ SOLN
INTRAMUSCULAR | Status: AC
  Filled 2013-08-11: qty 2

## 2013-08-11 MED ORDER — ZOLPIDEM TARTRATE 5 MG PO TABS: 5.0000 mg | ORAL_TABLET | Freq: Every evening | ORAL | Status: DC | PRN

## 2013-08-11 MED ORDER — SIMETHICONE 80 MG PO CHEW: 80.0000 mg | CHEWABLE_TABLET | ORAL | Status: DC | PRN

## 2013-08-11 MED ORDER — KETOROLAC TROMETHAMINE 30 MG/ML IJ SOLN: 15.0000 mg | Freq: Once | INTRAMUSCULAR | Status: AC | PRN

## 2013-08-11 MED ORDER — PROMETHAZINE HCL 25 MG/ML IJ SOLN: 6.2500 mg | INTRAMUSCULAR | Status: DC | PRN

## 2013-08-11 MED ORDER — FAMOTIDINE 20 MG PO TABS
40.0000 mg | ORAL_TABLET | Freq: Once | ORAL | Status: AC
  Administered 2013-08-11: 40 mg via ORAL
  Filled 2013-08-11: qty 2

## 2013-08-11 MED ORDER — 0.9 % SODIUM CHLORIDE (POUR BTL) OPTIME
TOPICAL | Status: DC | PRN
  Administered 2013-08-11: 1000 mL

## 2013-08-11 MED ORDER — LACTATED RINGERS IV SOLN
INTRAVENOUS | Status: DC
  Administered 2013-08-11: 10 mL/h via INTRAVENOUS
  Administered 2013-08-11: 11:00:00 via INTRAVENOUS

## 2013-08-11 MED ORDER — ONDANSETRON HCL 4 MG/2ML IJ SOLN: 4.0000 mg | INTRAMUSCULAR | Status: DC | PRN

## 2013-08-11 MED ORDER — BUPIVACAINE HCL (PF) 0.25 % IJ SOLN
INTRAMUSCULAR | Status: AC
Start: 2013-08-11 — End: 2013-08-11
  Filled 2013-08-11: qty 30

## 2013-08-11 MED ORDER — DIPHENHYDRAMINE HCL 25 MG PO CAPS: 25.0000 mg | ORAL_CAPSULE | Freq: Four times a day (QID) | ORAL | Status: DC | PRN

## 2013-08-11 MED ORDER — MEPERIDINE HCL 25 MG/ML IJ SOLN: 6.2500 mg | INTRAMUSCULAR | Status: DC | PRN

## 2013-08-11 MED ORDER — FENTANYL CITRATE 0.05 MG/ML IJ SOLN
INTRAMUSCULAR | Status: DC | PRN
  Administered 2013-08-11 (×5): 50 ug via INTRAVENOUS

## 2013-08-11 MED ORDER — ONDANSETRON HCL 4 MG/2ML IJ SOLN
INTRAMUSCULAR | Status: AC
Start: 2013-08-11 — End: 2013-08-11
  Filled 2013-08-11: qty 2

## 2013-08-11 MED ORDER — TETANUS-DIPHTH-ACELL PERTUSSIS 5-2.5-18.5 LF-MCG/0.5 IM SUSP: 0.5000 mL | Freq: Once | INTRAMUSCULAR | Status: DC

## 2013-08-11 MED ORDER — MIDAZOLAM HCL 5 MG/5ML IJ SOLN
INTRAMUSCULAR | Status: DC | PRN
Start: 2013-08-11 — End: 2013-08-11
  Administered 2013-08-11: 2 mg via INTRAVENOUS

## 2013-08-11 MED ORDER — MIDAZOLAM HCL 2 MG/2ML IJ SOLN: 0.5000 mg | Freq: Once | INTRAMUSCULAR | Status: AC | PRN

## 2013-08-11 MED ORDER — IBUPROFEN 600 MG PO TABS
600.0000 mg | ORAL_TABLET | Freq: Four times a day (QID) | ORAL | Status: DC
  Administered 2013-08-11 – 2013-08-12 (×4): 600 mg via ORAL
  Filled 2013-08-11 (×5): qty 1

## 2013-08-11 MED ORDER — BUPIVACAINE IN DEXTROSE 0.75-8.25 % IT SOLN
INTRATHECAL | Status: DC | PRN
  Administered 2013-08-11: 1.4 mL via INTRATHECAL

## 2013-08-11 MED ORDER — OXYCODONE-ACETAMINOPHEN 5-325 MG PO TABS
1.0000 | ORAL_TABLET | ORAL | Status: DC | PRN
  Administered 2013-08-11 – 2013-08-12 (×3): 1 via ORAL
  Filled 2013-08-11 (×3): qty 1

## 2013-08-11 MED ORDER — DIBUCAINE 1 % RE OINT: 1.0000 "application " | TOPICAL_OINTMENT | RECTAL | Status: DC | PRN

## 2013-08-11 MED ORDER — PROPOFOL 10 MG/ML IV EMUL
INTRAVENOUS | Status: AC
  Filled 2013-08-11: qty 20

## 2013-08-11 MED ORDER — PRENATAL MULTIVITAMIN CH
1.0000 | ORAL_TABLET | Freq: Every day | ORAL | Status: DC
  Filled 2013-08-11: qty 1

## 2013-08-11 MED ORDER — FENTANYL CITRATE 0.05 MG/ML IJ SOLN
INTRAMUSCULAR | Status: AC
  Filled 2013-08-11: qty 5

## 2013-08-11 MED ORDER — WITCH HAZEL-GLYCERIN EX PADS: 1.0000 "application " | MEDICATED_PAD | CUTANEOUS | Status: DC | PRN

## 2013-08-11 MED ORDER — BUPIVACAINE HCL (PF) 0.25 % IJ SOLN
INTRAMUSCULAR | Status: DC | PRN
  Administered 2013-08-11: 10 mL

## 2013-08-11 MED ORDER — KETOROLAC TROMETHAMINE 30 MG/ML IJ SOLN
INTRAMUSCULAR | Status: AC
  Filled 2013-08-11: qty 1

## 2013-08-11 MED ORDER — PROPOFOL 10 MG/ML IV BOLUS
INTRAVENOUS | Status: DC | PRN
  Administered 2013-08-11 (×10): 20 mg via INTRAVENOUS

## 2013-08-11 MED ORDER — BENZOCAINE-MENTHOL 20-0.5 % EX AERO
1.0000 "application " | INHALATION_SPRAY | CUTANEOUS | Status: DC | PRN
  Filled 2013-08-11: qty 56

## 2013-08-11 MED ORDER — FENTANYL CITRATE 0.05 MG/ML IJ SOLN: 25.0000 ug | INTRAMUSCULAR | Status: DC | PRN

## 2013-08-11 MED ORDER — KETOROLAC TROMETHAMINE 30 MG/ML IJ SOLN
INTRAMUSCULAR | Status: DC | PRN
  Administered 2013-08-11: 30 mg via INTRAVENOUS

## 2013-08-11 MED ORDER — LANOLIN HYDROUS EX OINT: TOPICAL_OINTMENT | CUTANEOUS | Status: DC | PRN

## 2013-08-11 SURGICAL SUPPLY — 19 items
CHLORAPREP W/TINT 26ML (MISCELLANEOUS) ×3 IMPLANT
CONTAINER PREFILL 10% NBF 15ML (MISCELLANEOUS) ×6 IMPLANT
DRSG OPSITE POSTOP 3X4 (GAUZE/BANDAGES/DRESSINGS) ×3 IMPLANT
GLOVE BIOGEL PI IND STRL 6.5 (GLOVE) ×1 IMPLANT
GLOVE BIOGEL PI INDICATOR 6.5 (GLOVE) ×2
GLOVE SURG SS PI 6.0 STRL IVOR (GLOVE) ×3 IMPLANT
GOWN STRL REUS W/TWL LRG LVL3 (GOWN DISPOSABLE) ×6 IMPLANT
NEEDLE HYPO 25X1 1.5 SAFETY (NEEDLE) IMPLANT
NS IRRIG 1000ML POUR BTL (IV SOLUTION) ×3 IMPLANT
PACK ABDOMINAL MINOR (CUSTOM PROCEDURE TRAY) ×3 IMPLANT
SPONGE LAP 4X18 X RAY DECT (DISPOSABLE) IMPLANT
SUT PLAIN 0 NONE (SUTURE) ×3 IMPLANT
SUT VIC AB 0 CT1 27 (SUTURE) ×2
SUT VIC AB 0 CT1 27XBRD ANBCTR (SUTURE) ×1 IMPLANT
SUT VIC AB 3-0 PS2 18 (SUTURE) ×3 IMPLANT
SYR CONTROL 10ML LL (SYRINGE) IMPLANT
TOWEL OR 17X24 6PK STRL BLUE (TOWEL DISPOSABLE) ×6 IMPLANT
TRAY FOLEY CATH 14FR (SET/KITS/TRAYS/PACK) ×3 IMPLANT
WATER STERILE IRR 1000ML POUR (IV SOLUTION) ×3 IMPLANT

## 2013-08-11 NOTE — Op Note (Signed)
NAME@ 08/10/2013 - 08/11/2013  PREOPERATIVE DIAGNOSIS:  Undesired fertility  POSTOPERATIVE DIAGNOSIS:  Undesired fertility  PROCEDURE:  Postpartum Bilateral Tubal Sterilization using Pomeroy method   ANESTHESIA:  Epidural  COMPLICATIONS:  None immediate.  ESTIMATED BLOOD LOSS:  Less than 20cc.  FLUIDS: 1400 cc LR.  INDICATIONS: 33 y.o. yo [id number]  with undesired fertility,status post vaginal delivery, desires permanent sterilization. Risks and benefits of procedure discussed with patient including permanence of method, bleeding, infection, injury to surrounding organs and need for additional procedures. Risk failure of 0.5-1% with increased risk of ectopic gestation if pregnancy occurs was also discussed with patient.   FINDINGS:  Normal uterus, tubes, and ovaries.  TECHNIQUE: After informed consent was obtained, the patient was taken to the operating room where anesthesia was induced and found to be adequate. A small transverse, infraumbilical skin incision was made with the scalpel. This incision was carried down to the underlying layer of fascia. The fascia was grasped with Kocher clamps tented up and entered sharply with Mayo scissors. Underlying peritoneum was then identified tented up and entered sharply with Metzenbaum scissors. The fascia was tagged with 0 Vicryl. The patient's left fallopian tube was then identified, brought to the incision, and grasped with a Babcock clamp. The tube was then followed out to the fimbria. The Babcock clamp was then used to grasp the tube approximately 4 cm from the cornual region. A 3 cm segment of the tube was then ligated with free tie of plain gut suture, transected and excised. Good hemostasis was noted and the tube was returned to the abdomen. The right fallopian tube was then identified to its fimbriated end, ligated, and a 3 cm segment excised in a similar fashion. Excellent hemostasis was noted, and the tube returned to the abdomen. The fascia was  re-approximated with 0 Vicryl. The skin was closed in a subcuticular fashion with 3-0 Vicryl. Quarter percent Marcaine solution was then injected at the incision site. The patient tolerated the procedure well. Sponge, lap, and needle count were correct x2. The patient was taken to recovery room in stable condition.

## 2013-08-11 NOTE — Addendum Note (Signed)
Addendum created 08/11/13 1845 by Flossie Dibble, CRNA   Modules edited: Notes Section   Notes Section:  File: [id number]

## 2013-08-11 NOTE — Progress Notes (Signed)
Patient requesting epidural. State,  RN needs to check vaginal exam.

## 2013-08-11 NOTE — Transfer of Care (Signed)
Immediate Anesthesia Transfer of Care Note  Patient: Cynthia Scott  Procedure(s) Performed: Procedure(s): POST PARTUM TUBAL LIGATION (N/A)  Patient Location: PACU  Anesthesia Type:Spinal  Level of Consciousness: awake, alert  and oriented  Airway & Oxygen Therapy: Patient Spontanous Breathing  Post-op Assessment: Report given to PACU RN  Post vital signs: Reviewed  Complications: No apparent anesthesia complications

## 2013-08-11 NOTE — Anesthesia Postprocedure Evaluation (Signed)
  Anesthesia Post Note  Patient: Cynthia Scott  Procedure(s) Performed: Procedure(s) (LRB): POST PARTUM TUBAL LIGATION (N/A)  Anesthesia type: Spinal  Patient location: PACU  Post pain: Pain level controlled  Post assessment: Post-op Vital signs reviewed  Last Vitals:  Filed Vitals:   08/11/13 1300  BP: 109/48  Pulse: 77  Temp: 36.9 C  Resp: 16    Post vital signs: Reviewed  Level of consciousness: awake  Complications: No apparent anesthesia complications

## 2013-08-11 NOTE — Progress Notes (Signed)
Post Partum Day #1  Subjective: voiding, having a fairly significant amt of discomfort with ambulation (is on chronic perc at home) NPO for procedure today  Objective: Blood pressure 109/63, pulse 86, temperature 97.7 F (36.5 C), temperature source Axillary, resp. rate 18, height 5\' 2"  (1.575 m), weight 130.636 kg (288 lb), unknown if currently breastfeeding.  Physical Exam:  General: alert and cooperative  Lochia: appropriate Uterine Fundus: firm Incision: n/a DVT Evaluation: No evidence of DVT seen on physical exam. No cords or calf tenderness.   Recent Labs  08/10/13 0720  HGB 11.6*  HCT 35.4*    Assessment/Plan: Ms Cynthia Scott is a 33 y.o [id number] who presents s/p SVD yesterday (IOL 2/2 poly, LGA baby) Doing well, likely needs perc for pain control as chronically on at home  Bottle feeding  Contraception Going for PPBTL today   LOS: 1 day   [name] 08/11/2013, 1:42 AM  Seen also by me Agree with note Seabron Spates, CNM

## 2013-08-11 NOTE — Progress Notes (Signed)
33 y.o. yo Cynthia Scott  with undesired fertility,status post vaginal delivery, desires permanent sterilization. Risks and benefits of procedure discussed with patient including permanence of method, bleeding, infection, injury to surrounding organs and need for additional procedures. Risk failure of 0.5-1% with increased risk of ectopic gestation if pregnancy occurs was also discussed with patient. Patient very anxious regarding spinal anesthesia. Anesthesiologist will counsel patient regarding risks and benefits.

## 2013-08-11 NOTE — Anesthesia Preprocedure Evaluation (Signed)
Anesthesia Evaluation  Patient identified by MRN, date of birth, ID band Patient awake    Reviewed: Allergy & Precautions, H&P , Patient's Chart, lab work & pertinent test results  Airway Mallampati: IV      Dental   Pulmonary Current Smoker,  breath sounds clear to auscultation        Cardiovascular Exercise Tolerance: Good hypertension, Rhythm:regular Rate:Normal     Neuro/Psych negative psych ROS   GI/Hepatic   Endo/Other  Morbid obesity  Renal/GU      Musculoskeletal   Abdominal   Peds  Hematology  (+) anemia ,   Anesthesia Other Findings   Reproductive/Obstetrics                           Anesthesia Physical Anesthesia Plan  ASA: III  Anesthesia Plan: Spinal   Post-op Pain Management:    Induction:   Airway Management Planned:   Additional Equipment:   Intra-op Plan:   Post-operative Plan:   Informed Consent: I have reviewed the patients History and Physical, chart, labs and discussed the procedure including the risks, benefits and alternatives for the proposed anesthesia with the patient or authorized representative who has indicated his/her understanding and acceptance.     Plan Discussed with: Anesthesiologist, CRNA and Surgeon  Anesthesia Plan Comments:         Anesthesia Quick Evaluation

## 2013-08-11 NOTE — Anesthesia Postprocedure Evaluation (Signed)
Anesthesia Post Note  Patient: Cynthia Scott  Procedure(s) Performed: Procedure(s) (LRB): POST PARTUM TUBAL LIGATION (N/A)  Anesthesia type: Spinal  Patient location: Mother/Baby  Post pain: Pain level controlled  Post assessment: Post-op Vital signs reviewed  Last Vitals:  Filed Vitals:   08/11/13 1740  BP: 99/60  Pulse: 85  Temp: 36.8 C  Resp: 16    Post vital signs: Reviewed  Level of consciousness: awake  Complications: No apparent anesthesia complications

## 2013-08-11 NOTE — Anesthesia Procedure Notes (Signed)
Spinal  Patient location during procedure: OR Start time: 08/11/2013 10:50 AM Staffing Anesthesiologist: Rudean Curt Performed by: anesthesiologist  Preanesthetic Checklist Completed: patient identified, site marked, surgical consent, pre-op evaluation, timeout performed, IV checked, risks and benefits discussed and monitors and equipment checked Spinal Block Patient position: sitting Prep: DuraPrep Patient monitoring: heart rate, cardiac monitor, continuous pulse ox and blood pressure Approach: midline Location: L3-4 Injection technique: single-shot Needle Needle type: Sprotte  Needle gauge: 24 G Needle length: 9 cm Assessment Sensory level: T4 Additional Notes Patient identified.  Risk benefits discussed including failed block, incomplete pain control, headache, nerve damage, paralysis, blood pressure changes, nausea, vomiting, reactions to medication both toxic or allergic, and postpartum back pain.  Patient expressed understanding and wished to proceed.  All questions were answered.  Sterile technique used throughout procedure.  CSF was clear.  No parasthesia or other complications.  Please see nursing notes for vital signs.

## 2013-08-11 NOTE — Progress Notes (Signed)
Patient to bay 5 of Short Stay.  Consent and 30-day papers signed and on chart.  Flushed PIV in R arm to patent.

## 2013-08-12 MED ORDER — OXYCODONE-ACETAMINOPHEN 5-325 MG PO TABS: 1.0000 | ORAL_TABLET | ORAL | Status: DC | PRN

## 2013-08-12 MED ORDER — IBUPROFEN 600 MG PO TABS: 600.0000 mg | ORAL_TABLET | Freq: Four times a day (QID) | ORAL | Status: DC

## 2013-08-12 MED ORDER — SENNOSIDES-DOCUSATE SODIUM 8.6-50 MG PO TABS: 2.0000 | ORAL_TABLET | ORAL | Status: AC

## 2013-08-12 NOTE — Discharge Instructions (Signed)
Vaginal Delivery Care After Refer to this sheet in the next few weeks. These discharge instructions provide you with information on caring for yourself after delivery. Your caregiver may also give you specific instructions. Your treatment has been planned according to the most current medical practices available, but problems sometimes occur. Call your caregiver if you have any problems or questions after you go home. HOME CARE INSTRUCTIONS  Take over-the-counter or prescription medicines only as directed by your caregiver or pharmacist.  Do not drink alcohol, especially if you are breastfeeding or taking medicine to relieve pain.  Do not chew or smoke tobacco.  Do not use illegal drugs.  Continue to use good perineal care. Good perineal care includes:  Wiping your perineum from front to back.  Keeping your perineum clean.  Do not use tampons or douche until your caregiver says it is okay.  Shower, wash your hair, and take tub baths as directed by your caregiver.  Wear a well-fitting bra that provides breast support.  Eat healthy foods.  Drink enough fluids to keep your urine clear or pale yellow.  Eat high-fiber foods such as whole grain cereals and breads, brown rice, beans, and fresh fruits and vegetables every day. These foods may help prevent or relieve constipation.  Follow your cargiver's recommendations regarding resumption of activities such as climbing stairs, driving, lifting, exercising, or traveling.  Talk to your caregiver about resuming sexual activities. Resumption of sexual activities is dependent upon your risk of infection, your rate of healing, and your comfort and desire to resume sexual activity.  Try to have someone help you with your household activities and your newborn for at least a few days after you leave the hospital.  Rest as much as possible. Try to rest or take a nap when your newborn is sleeping.  Increase your activities gradually.  Keep all  of your scheduled postpartum appointments. It is very important to keep your scheduled follow-up appointments. At these appointments, your caregiver will be checking to make sure that you are healing physically and emotionally. SEEK MEDICAL CARE IF:   You are passing large clots from your vagina. Save any clots to show your caregiver.  You have a foul smelling discharge from your vagina.  You have trouble urinating.  You are urinating frequently.  You have pain when you urinate.  You have a change in your bowel movements.  You have increasing redness, pain, or swelling near your vaginal incision (episiotomy) or vaginal tear.  You have pus draining from your episiotomy or vaginal tear.  Your episiotomy or vaginal tear is separating.  You have painful, hard, or reddened breasts.  You have a severe headache.  You have blurred vision or see spots.  You feel sad or depressed.  You have thoughts of hurting yourself or your newborn.  You have questions about your care, the care of your newborn, or medicines.  You are dizzy or lightheaded.  You have a rash.  You have nausea or vomiting.  You were breastfeeding and have not had a menstrual period within 12 weeks after you stopped breastfeeding.  You are not breastfeeding and have not had a menstrual period by the 12th week after delivery.  You have a fever. SEEK IMMEDIATE MEDICAL CARE IF:   You have persistent pain.  You have chest pain.  You have shortness of breath.  You faint.  You have leg pain.  You have stomach pain.  Your vaginal bleeding saturates two or more sanitary pads  in 1 hour. MAKE SURE YOU:   Understand these instructions.  Will watch your condition.  Will get help right away if you are not doing well or get worse. Document Released: 01/24/2000 Document Revised: 10/21/2011 Document Reviewed: 09/23/2011 [facility] Patient Information 2015 [place], [place]. This information is not intended to  replace advice given to you by your health care provider. Make sure you discuss any questions you have with your health care provider.

## 2013-08-12 NOTE — Discharge Summary (Signed)
Obstetric Discharge Summary Reason for Admission: induction of labor 2/2 polyhydramnios Prenatal Procedures: none Intrapartum Procedures: spontaneous vaginal delivery Postpartum Procedures: none Complications-Operative and Postpartum: none HGB  Date Value Ref Range Status  12/21/2012 11.2* 11.6 - 15.9 g/dL Final     Hemoglobin  Date Value Ref Range Status  08/10/2013 11.6* 12.0 - 15.0 g/dL Final     HCT  Date Value Ref Range Status  08/10/2013 35.4* 36.0 - 46.0 % Final  12/21/2012 35.1  34.8 - 46.6 % Final    Physical Exam:  General: alert and cooperative Lochia: appropriate Uterine Fundus: firm Incision: healing well DVT Evaluation: No evidence of DVT seen on physical exam. No cords or calf tenderness.  Discharge Diagnoses: Term Pregnancy-delivered Cynthia Scott is a 33 y.o. female [phone] with IUP at [redacted]w[redacted]d who presented for Induction of Labor due to polyhydramnios. Pt progressed quickly to dilation. And delivered a baby girl with no complications. EBL approx 400, no tears. Mother and baby did well post partum. Mother underwent BTL and tolerated procedure well. Discharged home on percocet (chronic X1 a day med). Currently bottle feeding, will f/up at [facility] in 4-6 weeks.    Discharge Information: Date: 08/12/2013 Activity: pelvic rest Diet: routine Medications: Ibuprofen and Percocet Condition: stable Instructions: refer to practice specific booklet Discharge to: home   Newborn Data: Live born female  Birth Weight: 8 lb 3.2 oz (3720 g) APGAR: 9, 9  Home with mother.  Cynthia Scott 08/12/2013, 6:42 AM  I spoke with and examined patient and agree with resident's note and plan of care.  Fredrik Rigger, MD OB Fellow 08/12/2013 7:55 AM

## 2013-08-13 LAB — TYPE AND SCREEN
ABO/RH(D): A POS
Antibody Screen: NEGATIVE
Donor AG Type: NEGATIVE
Donor AG Type: NEGATIVE
PT AG Type: NEGATIVE
Unit division: 0
Unit division: 0

## 2013-08-13 NOTE — Discharge Summary (Signed)
Attestation of Attending Supervision of Advanced Practitioner (CNM/NP): Evaluation and management procedures were performed by the Advanced Practitioner under my supervision and collaboration.  I have reviewed the Advanced Practitioner's note and chart, and I agree with the management and plan.  Cynthia Scott 08/13/2013 5:53 PM

## 2013-08-14 ENCOUNTER — Encounter (HOSPITAL_COMMUNITY): Payer: Self-pay | Admitting: Obstetrics and Gynecology

## 2013-08-15 NOTE — Progress Notes (Signed)
Post discharge chart review completed.  

## 2013-08-16 ENCOUNTER — Telehealth: Payer: Self-pay

## 2013-08-16 NOTE — Telephone Encounter (Signed)
Patient called stating she had a baby on 08/10/13 and is having complications. Would like a call back.

## 2013-08-16 NOTE — Telephone Encounter (Signed)
Called patient who states she was having some swelling in her legs but it has since gone down and is not worried. Reports burning with urination and thinks she may have a UTI. Informed patient she can come into clinic and drop off UA that we will send for culture and treat if necessary. Patient verbalized understanding and gratitude and states she will come in tomorrow. No further questions or concerns.

## 2013-09-01 ENCOUNTER — Encounter: Payer: Self-pay | Admitting: General Practice

## 2013-09-11 ENCOUNTER — Telehealth: Payer: Self-pay

## 2013-09-11 ENCOUNTER — Other Ambulatory Visit: Payer: Self-pay | Admitting: Obstetrics & Gynecology

## 2013-09-11 DIAGNOSIS — M549 Dorsalgia, unspecified: Secondary | ICD-10-CM

## 2013-09-11 DIAGNOSIS — O26899 Other specified pregnancy related conditions, unspecified trimester: Principal | ICD-10-CM

## 2013-09-11 MED ORDER — OXYCODONE-ACETAMINOPHEN 5-325 MG PO TABS: 1.0000 | ORAL_TABLET | ORAL | Status: DC | PRN

## 2013-09-11 MED ORDER — CYCLOBENZAPRINE HCL 10 MG PO TABS: 10.0000 mg | ORAL_TABLET | Freq: Three times a day (TID) | ORAL | Status: AC | PRN

## 2013-09-11 NOTE — Telephone Encounter (Signed)
Spoke to Dr. Roselie Awkward who agreed to reorder patient's percocet-- 15 tabs prescribed. Flexeril refilled as well. No more refills until PP, during which patient will then need to seek primary care for management. Attempted to call and inform patient-- mailbox full-- unable to leave message. Percocet RX ready for pick up in front office.

## 2013-09-11 NOTE — Telephone Encounter (Signed)
Patient called requesting pain RX refill--- did not specify which medication.

## 2013-09-11 NOTE — Telephone Encounter (Signed)
Called patient who is requesting refill of percocet and flexeril for chronic back pain-- states she was on it throughout pregnancy. Patient also states she needs to reschedule PP appointment this Wednesday as she has court. Informed patient I would let front staff know, explained it may be difficult to find her another appointment but that she would receive a call from them with rescheduled appointment. Informed patient I would speak to provider regarding refills and call her back when I have information. Patient verbalized understanding.

## 2013-09-12 ENCOUNTER — Other Ambulatory Visit: Payer: Self-pay

## 2013-09-12 MED ORDER — OXYCODONE-ACETAMINOPHEN 10-325 MG PO TABS: 1.0000 | ORAL_TABLET | ORAL | Status: DC | PRN

## 2013-09-12 NOTE — Telephone Encounter (Addendum)
Called patient on both numbers. Left messages on both lines stating your prescriptions have been refilled per your request-- one is waiting at your pharmacy the other is a written RX that needs to be picked up here in the clinic and you may pick it up at any time between now and 430 today-- call clinic with questions.   8/4  1045  Pt returned call to clinic and I informed her that her Rx for flexeril has been sent to her pharmacy. The rx for Percocet will need to be picked up from our clinic - we are here until 4:30 today. Future refills will be discussed at her PP visit.  She has appt on 8/5 @ 1400 for PP visit.  Pt voiced understanding.   Diane Day RNC

## 2013-09-13 ENCOUNTER — Ambulatory Visit: Payer: Medicaid Other | Admitting: Obstetrics and Gynecology

## 2013-09-15 ENCOUNTER — Encounter: Payer: Self-pay | Admitting: *Deleted

## 2013-09-28 ENCOUNTER — Encounter: Payer: Self-pay | Admitting: *Deleted

## 2013-10-12 ENCOUNTER — Encounter: Payer: Self-pay | Admitting: Nurse Practitioner

## 2013-10-12 ENCOUNTER — Ambulatory Visit: Payer: Medicaid Other | Admitting: Nurse Practitioner

## 2013-10-17 ENCOUNTER — Encounter: Payer: Self-pay | Admitting: General Practice

## 2013-12-11 ENCOUNTER — Encounter (HOSPITAL_COMMUNITY): Payer: Self-pay | Admitting: Obstetrics and Gynecology

## 2014-01-11 ENCOUNTER — Other Ambulatory Visit: Payer: Medicaid Other

## 2014-01-11 ENCOUNTER — Ambulatory Visit: Payer: Medicaid Other | Admitting: Hematology and Oncology

## 2014-01-15 ENCOUNTER — Emergency Department (HOSPITAL_COMMUNITY): Payer: Medicaid Other

## 2014-01-15 ENCOUNTER — Encounter (HOSPITAL_COMMUNITY): Payer: Self-pay | Admitting: Emergency Medicine

## 2014-01-15 ENCOUNTER — Emergency Department (HOSPITAL_COMMUNITY)
Admission: EM | Admit: 2014-01-15 | Discharge: 2014-01-16 | Disposition: A | Discharge status: Home / Self Care | Payer: Medicaid Other | Attending: Emergency Medicine | Admitting: Emergency Medicine

## 2014-01-15 DIAGNOSIS — Z720 Tobacco use: Secondary | ICD-10-CM | POA: Diagnosis not present

## 2014-01-15 DIAGNOSIS — R000 Tachycardia, unspecified: Secondary | ICD-10-CM | POA: Diagnosis not present

## 2014-01-15 DIAGNOSIS — Z8571 Personal history of Hodgkin lymphoma: Secondary | ICD-10-CM | POA: Insufficient documentation

## 2014-01-15 DIAGNOSIS — Z8572 Personal history of non-Hodgkin lymphomas: Secondary | ICD-10-CM | POA: Insufficient documentation

## 2014-01-15 DIAGNOSIS — M791 Myalgia, unspecified site: Secondary | ICD-10-CM

## 2014-01-15 DIAGNOSIS — Z87440 Personal history of urinary (tract) infections: Secondary | ICD-10-CM | POA: Diagnosis not present

## 2014-01-15 DIAGNOSIS — Z79899 Other long term (current) drug therapy: Secondary | ICD-10-CM | POA: Diagnosis not present

## 2014-01-15 DIAGNOSIS — M549 Dorsalgia, unspecified: Secondary | ICD-10-CM | POA: Diagnosis present

## 2014-01-15 DIAGNOSIS — Z862 Personal history of diseases of the blood and blood-forming organs and certain disorders involving the immune mechanism: Secondary | ICD-10-CM | POA: Diagnosis not present

## 2014-01-15 LAB — COMPREHENSIVE METABOLIC PANEL
ALT: 27 U/L (ref 0–35)
ANION GAP: 14 (ref 5–15)
AST: 20 U/L (ref 0–37)
Albumin: 3.9 g/dL (ref 3.5–5.2)
Alkaline Phosphatase: 87 U/L (ref 39–117)
BILIRUBIN TOTAL: 0.2 mg/dL — AB (ref 0.3–1.2)
BUN: 22 mg/dL (ref 6–23)
CO2: 23 meq/L (ref 19–32)
CREATININE: 0.8 mg/dL (ref 0.50–1.10)
Calcium: 9.2 mg/dL (ref 8.4–10.5)
Chloride: 102 mEq/L (ref 96–112)
GFR calc Af Amer: >90 mL/min (ref >90)
Glucose, Bld: 92 mg/dL (ref 70–99)
Potassium: 4.2 mEq/L (ref 3.7–5.3)
Sodium: 139 mEq/L (ref 137–147)
Total Protein: 7.6 g/dL (ref 6.0–8.3)

## 2014-01-15 LAB — I-STAT TROPONIN, ED: Troponin i, poc: 0.01 ng/mL (ref 0.00–0.08)

## 2014-01-15 LAB — CBC
HEMATOCRIT: 39.5 % (ref 36.0–46.0)
Hemoglobin: 12.4 g/dL (ref 12.0–15.0)
MCH: 26.1 pg (ref 26.0–34.0)
MCHC: 31.4 g/dL (ref 30.0–36.0)
MCV: 83.2 fL (ref 78.0–100.0)
Platelets: 313 10*3/uL (ref 150–400)
RBC: 4.75 MIL/uL (ref 3.87–5.11)
RDW: 16.4 % — ABNORMAL HIGH (ref 11.5–15.5)
WBC: 12 10*3/uL — ABNORMAL HIGH (ref 4.0–10.5)

## 2014-01-15 LAB — PROTIME-INR
INR: 0.96 (ref 0.00–1.49)
PROTHROMBIN TIME: 12.9 s (ref 11.6–15.2)

## 2014-01-15 LAB — D-DIMER, QUANTITATIVE: D-Dimer, Quant: 0.28 ug/mL-FEU (ref 0.00–0.48)

## 2014-01-15 LAB — APTT: aPTT: 29 seconds (ref 24–37)

## 2014-01-15 MED ORDER — ASPIRIN 81 MG PO CHEW
324.0000 mg | CHEWABLE_TABLET | Freq: Once | ORAL | Status: AC
  Administered 2014-01-15: 324 mg via ORAL
  Filled 2014-01-15: qty 4

## 2014-01-15 MED ORDER — SODIUM CHLORIDE 0.9 % IV SOLN
1000.0000 mL | INTRAVENOUS | Status: DC
  Administered 2014-01-15: 1000 mL via INTRAVENOUS

## 2014-01-15 MED ORDER — TRAMADOL HCL 50 MG PO TABS: 50.0000 mg | ORAL_TABLET | Freq: Four times a day (QID) | ORAL | Status: AC | PRN

## 2014-01-15 NOTE — ED Provider Notes (Signed)
CSN: [id number]     Arrival date & time 01/15/14  1945 History   First MD Initiated Contact with Patient 01/15/14 2100     Chief Complaint  Patient presents with  . Leg Swelling  . Back Pain   HPI The patient has a complex medical history including non-Hodgkin's lymphoma. The patient underwent chemotherapy treatments for 6 months. She was lost to follow-up back in 2013. The patient in the interim had pregnancies and has not followed up with her oncologist. Patient comes to the emergency room complaining of several weeks of pain on the right side of her chest. It is worse when she lays down and when one of her children lies on the right side of her chest. The pain is sharp and radiates to her back. The patient also has noticed some swelling in her legs. She is also felt some soft tissue lumps in her thighs. She denies any weight loss or fevers. No shortness of breath. Past Medical History  Diagnosis Date  . Blood transfusion 01/25/2009  . Urinary tract infection   . 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 Anemia   . Non-Hodgkin lymphoma   . 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 Cancer   . Non Hodgkin's lymphoma 2012  . Hodgkins lymphoma     pt sttes she was supposed to get treatment but showed up pregnant  . Obesity complicating pregnancy in third trimester   . Pregnancy induced hypertension   . Blood transfusion without reported diagnosis   . Weight gain 12/21/2012  . Dysphagia 01/11/2013  . Anemia, unspecified 01/11/2013   Past Surgical History  Procedure Laterality Date  . Rhinoplasty    . Port-a-cath removal    . Chest wall tumor excision    . Tonsillectomy    . Wisdom tooth extraction  2010  . Appendectomy    . Cholecystectomy    . Tubes in ears    . Turmor in chest    . Tubal ligation N/A 08/11/2013    Procedure: POST PARTUM TUBAL LIGATION;  Surgeon: Mora Bellman, MD;  Location: [facility];  Service: Gynecology;  Laterality: N/A;   Family History  Problem Relation Age of Onset  .  Anesthesia problems Neg Hx   . Heart disease Mother   . COPD Mother   . Diabetes Mother   . Heart disease Maternal Aunt   . Diabetes Maternal Aunt   . Cancer Maternal Uncle     colon cancer  . Heart disease Maternal Grandmother    History  Substance Use Topics  . Smoking status: Current Every Day Smoker -- 0.50 packs/day for 13 years    Types: Cigarettes  . Smokeless tobacco: Never Used  . Alcohol Use: No   OB History    Gravida Para Term Preterm AB TAB SAB Ectopic Multiple Living   11 7 7  4  0 4   7     Review of Systems  All other systems reviewed and are negative.     Allergies  Review of patient's allergies indicates no known allergies.  Home Medications   Prior to Admission medications   Medication Sig Start Date End Date Taking? Authorizing Provider  cyclobenzaprine (FLEXERIL) 10 MG tablet Take 1 tablet (10 mg total) by mouth 3 (three) times daily as needed for muscle spasms. Patient not taking: Reported on 01/15/2014 09/11/13   Woodroe Mode, MD  flintstones complete (FLINTSTONES) 60 MG chewable tablet Chew 2 tablets by mouth daily.  Historical Provider, MD  IRON PO Take 1 tablet by mouth daily.    Historical Provider, MD  senna-docusate (SENOKOT-S) 8.6-50 MG per tablet Take 2 tablets by mouth daily. Patient not taking: Reported on 01/15/2014 08/12/13   Bernadene Bell, MD  traMADol (ULTRAM) 50 MG tablet Take 1 tablet (50 mg total) by mouth every 6 (six) hours as needed. 01/15/14   Dorie Rank, MD   BP 96/36 mmHg  Pulse 78  Temp(Src) 97.7 F (36.5 C) (Oral)  Resp 20  SpO2 97%  LMP 01/06/2014 (Approximate) Physical Exam  Constitutional: No distress.  Morbidly obese  HENT:  Head: Normocephalic and atraumatic.  Right Ear: External ear normal.  Left Ear: External ear normal.  Eyes: Conjunctivae are normal. Right eye exhibits no discharge. Left eye exhibits no discharge. No scleral icterus.  Neck: Neck supple. No tracheal deviation present.  Cardiovascular:  Normal rate, regular rhythm and intact distal pulses.   Pulmonary/Chest: Effort normal and breath sounds normal. No stridor. No respiratory distress. She has no wheezes. She has no rales.  Abdominal: Soft. Bowel sounds are normal. She exhibits no distension. There is no tenderness. There is no rebound and no guarding.  Musculoskeletal: She exhibits no edema or tenderness.  No edema noted in the ankles and feet, mobile approximately 1-2 cm soft tissue masses palpable in the right thigh  Neurological: She is alert. She has normal strength. No cranial nerve deficit (no facial droop, extraocular movements intact, no slurred speech) or sensory deficit. She exhibits normal muscle tone. She displays no seizure activity. Coordination normal.  Skin: Skin is warm and dry. No rash noted.  Psychiatric: She has a normal mood and affect.  Nursing note and vitals reviewed.   ED Course  Procedures (including critical care time) Labs Review Labs Reviewed  CBC - Abnormal; Notable for the following:    WBC 12.0 (*)    RDW 16.4 (*)    All other components within normal limits  COMPREHENSIVE METABOLIC PANEL - Abnormal; Notable for the following:    Total Bilirubin 0.2 (*)    All other components within normal limits  APTT  PROTIME-INR  D-DIMER, QUANTITATIVE  I-STAT TROPOININ, ED    Imaging Review Dg Chest 2 View  01/15/2014   CLINICAL DATA:  Chest pain  EXAM: CHEST  2 VIEW  COMPARISON:  08/17/2012  FINDINGS: The heart size and mediastinal contours are within normal limits. Both lungs are clear. The visualized skeletal structures are unremarkable.  IMPRESSION: No active cardiopulmonary disease.   Electronically Signed   By: Inez Catalina M.D.   On: 01/15/2014 21:55     EKG Interpretation   Date/Time:  Monday January 15 2014 21:27:27 EST Ventricular Rate:  71 PR Interval:  151 QRS Duration: 70 QT Interval:  409 QTC Calculation: 444 R Axis:   27 Text Interpretation:  Sinus rhythm No significant  change since last  tracing Confirmed by Jaaziah Schulke  MD-J, Gaynelle Pastrana ([id number]) on 01/15/2014 9:35:55 PM      MDM   Final diagnoses:  Myalgia    No pna on cxr.  No mediastinal lymphadenopathy noted on CXR.  D dimer negative.  Doubt PE.  No focal swelling noted on my exam to suggest DVT.  Rec follow up with her oncologist.  Soft tissue masses in thighs could be lipomas but they do warrant close follow up    Dorie Rank, MD 01/15/14 2349

## 2014-01-15 NOTE — ED Notes (Signed)
Pt states she has pain in her right upper chest and in her lower back when she breathes  Pt states pain is worse at night when she lays down  Pt states she also has swelling in her lower extremities  Pt states she has had this for some time  Pt states when you press on her chest on the right side it feels like something is stabbing her in her back  Pt states she is a pt at [facility] for hodgkins lymphoma  Pt states she had a baby 5 mths ago so has not been able to do treatments

## 2014-02-16 ENCOUNTER — Emergency Department (HOSPITAL_COMMUNITY)
Admission: EM | Admit: 2014-02-16 | Discharge: 2014-02-17 | Disposition: A | Discharge status: Home / Self Care | Payer: Medicaid Other | Attending: Emergency Medicine | Admitting: Emergency Medicine

## 2014-02-16 ENCOUNTER — Encounter (HOSPITAL_COMMUNITY): Payer: Self-pay | Admitting: Emergency Medicine

## 2014-02-16 ENCOUNTER — Emergency Department (HOSPITAL_COMMUNITY): Payer: Medicaid Other

## 2014-02-16 DIAGNOSIS — Z859 Personal history of malignant neoplasm, unspecified: Secondary | ICD-10-CM | POA: Insufficient documentation

## 2014-02-16 DIAGNOSIS — Z862 Personal history of diseases of the blood and blood-forming organs and certain disorders involving the immune mechanism: Secondary | ICD-10-CM | POA: Diagnosis not present

## 2014-02-16 DIAGNOSIS — Z8572 Personal history of non-Hodgkin lymphomas: Secondary | ICD-10-CM | POA: Diagnosis not present

## 2014-02-16 DIAGNOSIS — Z3202 Encounter for pregnancy test, result negative: Secondary | ICD-10-CM | POA: Insufficient documentation

## 2014-02-16 DIAGNOSIS — M791 Myalgia, unspecified site: Secondary | ICD-10-CM

## 2014-02-16 DIAGNOSIS — E669 Obesity, unspecified: Secondary | ICD-10-CM | POA: Diagnosis not present

## 2014-02-16 DIAGNOSIS — Z8579 Personal history of other malignant neoplasms of lymphoid, hematopoietic and related tissues: Secondary | ICD-10-CM | POA: Insufficient documentation

## 2014-02-16 DIAGNOSIS — Z79899 Other long term (current) drug therapy: Secondary | ICD-10-CM | POA: Insufficient documentation

## 2014-02-16 DIAGNOSIS — Z87440 Personal history of urinary (tract) infections: Secondary | ICD-10-CM | POA: Insufficient documentation

## 2014-02-16 DIAGNOSIS — R011 Cardiac murmur, unspecified: Secondary | ICD-10-CM | POA: Insufficient documentation

## 2014-02-16 DIAGNOSIS — R079 Chest pain, unspecified: Secondary | ICD-10-CM | POA: Diagnosis not present

## 2014-02-16 DIAGNOSIS — Z720 Tobacco use: Secondary | ICD-10-CM | POA: Diagnosis not present

## 2014-02-16 DIAGNOSIS — M545 Low back pain: Secondary | ICD-10-CM | POA: Diagnosis present

## 2014-02-16 LAB — CBC WITH DIFFERENTIAL/PLATELET
BASOS PCT: 0 % (ref 0–1)
Basophils Absolute: 0 10*3/uL (ref 0.0–0.1)
Eosinophils Absolute: 0.2 10*3/uL (ref 0.0–0.7)
Eosinophils Relative: 2 % (ref 0–5)
HEMATOCRIT: 39.3 % (ref 36.0–46.0)
Hemoglobin: 12.1 g/dL (ref 12.0–15.0)
Lymphocytes Relative: 34 % (ref 12–46)
Lymphs Abs: 3.4 10*3/uL (ref 0.7–4.0)
MCH: 26.1 pg (ref 26.0–34.0)
MCHC: 30.8 g/dL (ref 30.0–36.0)
MCV: 84.7 fL (ref 78.0–100.0)
Monocytes Absolute: 0.6 10*3/uL (ref 0.1–1.0)
Monocytes Relative: 6 % (ref 3–12)
NEUTROS ABS: 5.8 10*3/uL (ref 1.7–7.7)
Neutrophils Relative %: 58 % (ref 43–77)
PLATELETS: 354 10*3/uL (ref 150–400)
RBC: 4.64 MIL/uL (ref 3.87–5.11)
RDW: 16.7 % — ABNORMAL HIGH (ref 11.5–15.5)
WBC: 10 10*3/uL (ref 4.0–10.5)

## 2014-02-16 LAB — BASIC METABOLIC PANEL
Anion gap: 6 (ref 5–15)
BUN: 16 mg/dL (ref 6–23)
CALCIUM: 8.5 mg/dL (ref 8.4–10.5)
CO2: 24 mmol/L (ref 19–32)
Chloride: 107 mEq/L (ref 96–112)
Creatinine, Ser: 0.64 mg/dL (ref 0.50–1.10)
GFR calc Af Amer: >90 mL/min (ref >90)
GFR calc non Af Amer: >90 mL/min (ref >90)
GLUCOSE: 89 mg/dL (ref 70–99)
Potassium: 3.3 mmol/L — ABNORMAL LOW (ref 3.5–5.1)
SODIUM: 137 mmol/L (ref 135–145)

## 2014-02-16 MED ORDER — IBUPROFEN 600 MG PO TABS: 600.0000 mg | ORAL_TABLET | Freq: Four times a day (QID) | ORAL | Status: AC | PRN

## 2014-02-16 MED ORDER — METHOCARBAMOL 750 MG PO TABS: 750.0000 mg | ORAL_TABLET | Freq: Four times a day (QID) | ORAL | Status: AC

## 2014-02-16 MED ORDER — OXYCODONE-ACETAMINOPHEN 5-325 MG PO TABS: 1.0000 | ORAL_TABLET | ORAL | Status: AC | PRN

## 2014-02-16 MED ORDER — OXYCODONE-ACETAMINOPHEN 5-325 MG PO TABS
2.0000 | ORAL_TABLET | Freq: Once | ORAL | Status: AC
  Administered 2014-02-16: 2 via ORAL
  Filled 2014-02-16: qty 2

## 2014-02-16 NOTE — ED Notes (Signed)
Patient c/o bilateral lower back pain radiating to right side and right abdomen and right lower groin (has been "going on for a while" but worsened the past few days). Denies past back surgery or recent back injury. Does have multiple children of which she lifts. No hx kidney stones. Denies dysuria or urinary changes. Reports "not being able to get up in the morning the pain is so bad. I'm also having bilateral ear pain." Hasn't taken any pain medications today. Patient is ambulatory and moving all extremities. No other complaints/concerns.

## 2014-02-16 NOTE — ED Notes (Signed)
PT attempted to provide urine sample, however she reports that she dropped her urine. Pt was made aware of the need for a sample. Water provided with medication administration.

## 2014-02-16 NOTE — ED Notes (Signed)
No answer in WR

## 2014-02-16 NOTE — ED Provider Notes (Signed)
CSN: [id number]     Arrival date & time 02/16/14  1920 History   First MD Initiated Contact with Patient 02/16/14 2152     Chief Complaint  Patient presents with  . Back Pain     (Consider location/radiation/quality/duration/timing/severity/associated sxs/prior Treatment) HPI Comments: Patient here complaining of several month history of right-sided lower back pain without associated dysuria or hematuria. No fever or chills. Symptoms are worse in the morning and continue throughout the day. No weakness in her lower extremities. Denies any saddle anesthesias. Patient has a history of non-Hodgkin's lymphoma and was seen by her oncologist several weeks ago and was diagnosed with possible costochondritis and given referral to physical therapy. She is been using Flexeril and Ultram without relief.  Patient is a 34 y.o. female presenting with back pain. The history is provided by the patient.  Back Pain   Past Medical History  Diagnosis Date  . Blood transfusion 01/25/2009  . Urinary tract infection   . 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 Anemia   . Non-Hodgkin lymphoma   . 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 Cancer   . Non Hodgkin's lymphoma 2012  . Hodgkins lymphoma     pt sttes she was supposed to get treatment but showed up pregnant  . Obesity complicating pregnancy in third trimester   . Pregnancy induced hypertension   . Blood transfusion without reported diagnosis   . Weight gain 12/21/2012  . Dysphagia 01/11/2013  . Anemia, unspecified 01/11/2013   Past Surgical History  Procedure Laterality Date  . Rhinoplasty    . Port-a-cath removal    . Chest wall tumor excision    . Tonsillectomy    . Wisdom tooth extraction  2010  . Appendectomy    . Cholecystectomy    . Tubes in ears    . Turmor in chest    . Tubal ligation N/A 08/11/2013    Procedure: POST PARTUM TUBAL LIGATION;  Surgeon: Mora Bellman, MD;  Location: [facility];  Service: Gynecology;  Laterality: N/A;   Family  History  Problem Relation Age of Onset  . Anesthesia problems Neg Hx   . Heart disease Mother   . COPD Mother   . Diabetes Mother   . Heart disease Maternal Aunt   . Diabetes Maternal Aunt   . Cancer Maternal Uncle     colon cancer  . Heart disease Maternal Grandmother    History  Substance Use Topics  . Smoking status: Current Every Day Smoker -- 0.50 packs/day for 13 years    Types: Cigarettes  . Smokeless tobacco: Never Used  . Alcohol Use: No   OB History    Gravida Para Term Preterm AB TAB SAB Ectopic Multiple Living   11 7 7  4  0 4   7     Review of Systems  Musculoskeletal: Positive for back pain.  All other systems reviewed and are negative.     Allergies  Review of patient's allergies indicates no known allergies.  Home Medications   Prior to Admission medications   Medication Sig Start Date End Date Taking? Authorizing Provider  traMADol (ULTRAM) 50 MG tablet Take 1 tablet (50 mg total) by mouth every 6 (six) hours as needed. 01/15/14  Yes Dorie Rank, MD  cyclobenzaprine (FLEXERIL) 10 MG tablet Take 1 tablet (10 mg total) by mouth 3 (three) times daily as needed for muscle spasms. Patient not taking: Reported on 01/15/2014 09/11/13   Woodroe Mode, MD  flintstones complete (FLINTSTONES) 60 MG chewable tablet Chew 2 tablets by mouth daily.    Historical Provider, MD  IRON PO Take 1 tablet by mouth daily.    Historical Provider, MD  senna-docusate (SENOKOT-S) 8.6-50 MG per tablet Take 2 tablets by mouth daily. Patient not taking: Reported on 01/15/2014 08/12/13   Bernadene Bell, MD   BP 126/98 mmHg  Pulse 87  Temp(Src) 97.5 F (36.4 C) (Oral)  Resp 18  Wt 280 lb 1.6 oz (127.053 kg)  SpO2 99%  LMP 12/29/2013 (Approximate)  Breastfeeding? No Physical Exam  Constitutional: She is oriented to person, place, and time. She appears well-developed and well-nourished.  Non-toxic appearance. No distress.  HENT:  Head: Normocephalic and atraumatic.  Eyes:  Conjunctivae, EOM and lids are normal. Pupils are equal, round, and reactive to light.  Neck: Normal range of motion. Neck supple. No tracheal deviation present. No thyroid mass present.  Cardiovascular: Normal rate, regular rhythm and normal heart sounds.  Exam reveals no gallop.   No murmur heard. Pulmonary/Chest: Effort normal and breath sounds normal. No stridor. No respiratory distress. She has no decreased breath sounds. She has no wheezes. She has no rhonchi. She has no rales.  Abdominal: Soft. Normal appearance and bowel sounds are normal. She exhibits no distension. There is no tenderness. There is no rebound and no CVA tenderness.  Musculoskeletal: Normal range of motion. She exhibits no edema or tenderness.       Arms: Neurological: She is alert and oriented to person, place, and time. She has normal strength. No cranial nerve deficit or sensory deficit. GCS eye subscore is 4. GCS verbal subscore is 5. GCS motor subscore is 6.  Skin: Skin is warm and dry. No abrasion and no rash noted.  Psychiatric: She has a normal mood and affect. Her speech is normal and behavior is normal.  Nursing note and vitals reviewed.   ED Course  Procedures (including critical care time) Labs Review Labs Reviewed  CBC WITH DIFFERENTIAL  BASIC METABOLIC PANEL  URINALYSIS, ROUTINE W REFLEX MICROSCOPIC  POC URINE PREG, ED    Imaging Review No results found.   EKG Interpretation None      MDM   Final diagnoses:  Chest pain    Urinalysis and xrays pending, signed out to Dr. Adella Nissen, MD 02/16/14 [phone]

## 2014-02-16 NOTE — Discharge Instructions (Signed)

## 2014-02-17 LAB — URINALYSIS, ROUTINE W REFLEX MICROSCOPIC
BILIRUBIN URINE: NEGATIVE
GLUCOSE, UA: NEGATIVE mg/dL
Hgb urine dipstick: NEGATIVE
KETONES UR: NEGATIVE mg/dL
Nitrite: NEGATIVE
Protein, ur: NEGATIVE mg/dL
Specific Gravity, Urine: 1.028 (ref 1.005–1.030)
Urobilinogen, UA: 0.2 mg/dL (ref 0.0–1.0)
pH: 6 (ref 5.0–8.0)

## 2014-02-17 LAB — URINE MICROSCOPIC-ADD ON

## 2014-02-17 LAB — POC URINE PREG, ED: Preg Test, Ur: NEGATIVE

## 2014-02-17 NOTE — ED Notes (Signed)
Patient appears to be resting comfortably.She rates her pain 10/10 as before.

## 2014-02-17 NOTE — ED Notes (Signed)
Pt reports she is ready to be discharged.

## 2014-02-17 NOTE — ED Notes (Signed)
Pt alert, oriented,and ambulatory upon DC. She reports her female visitor is driving her home.

## 2015-12-18 IMAGING — CR DG CHEST 2V
2 series · 2 of 2 positions shown · non-contrast
Comparison: 08/17/2012

CLINICAL DATA: Chest pain

EXAM:
CHEST  2 VIEW

[w chest pa]
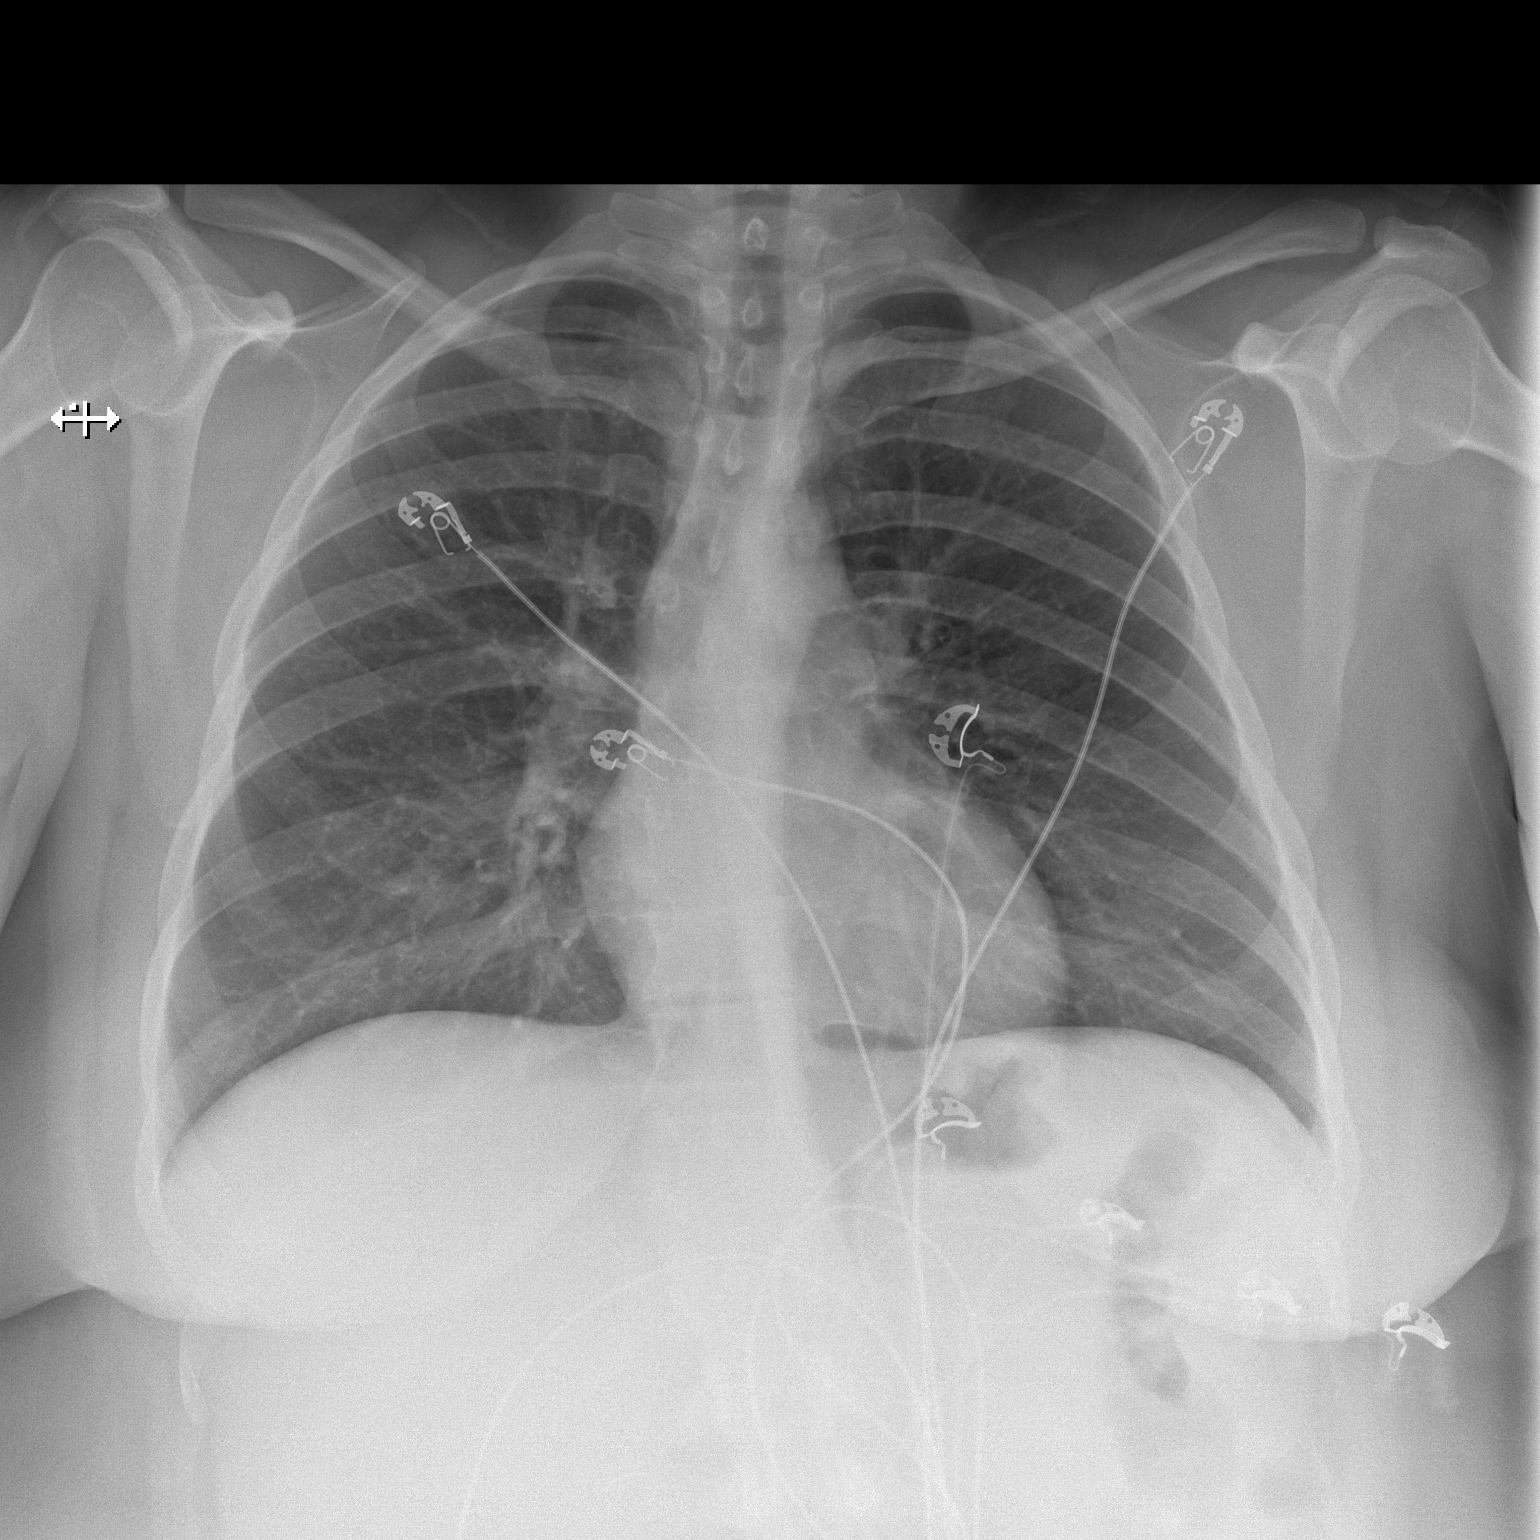

[w chest lat]
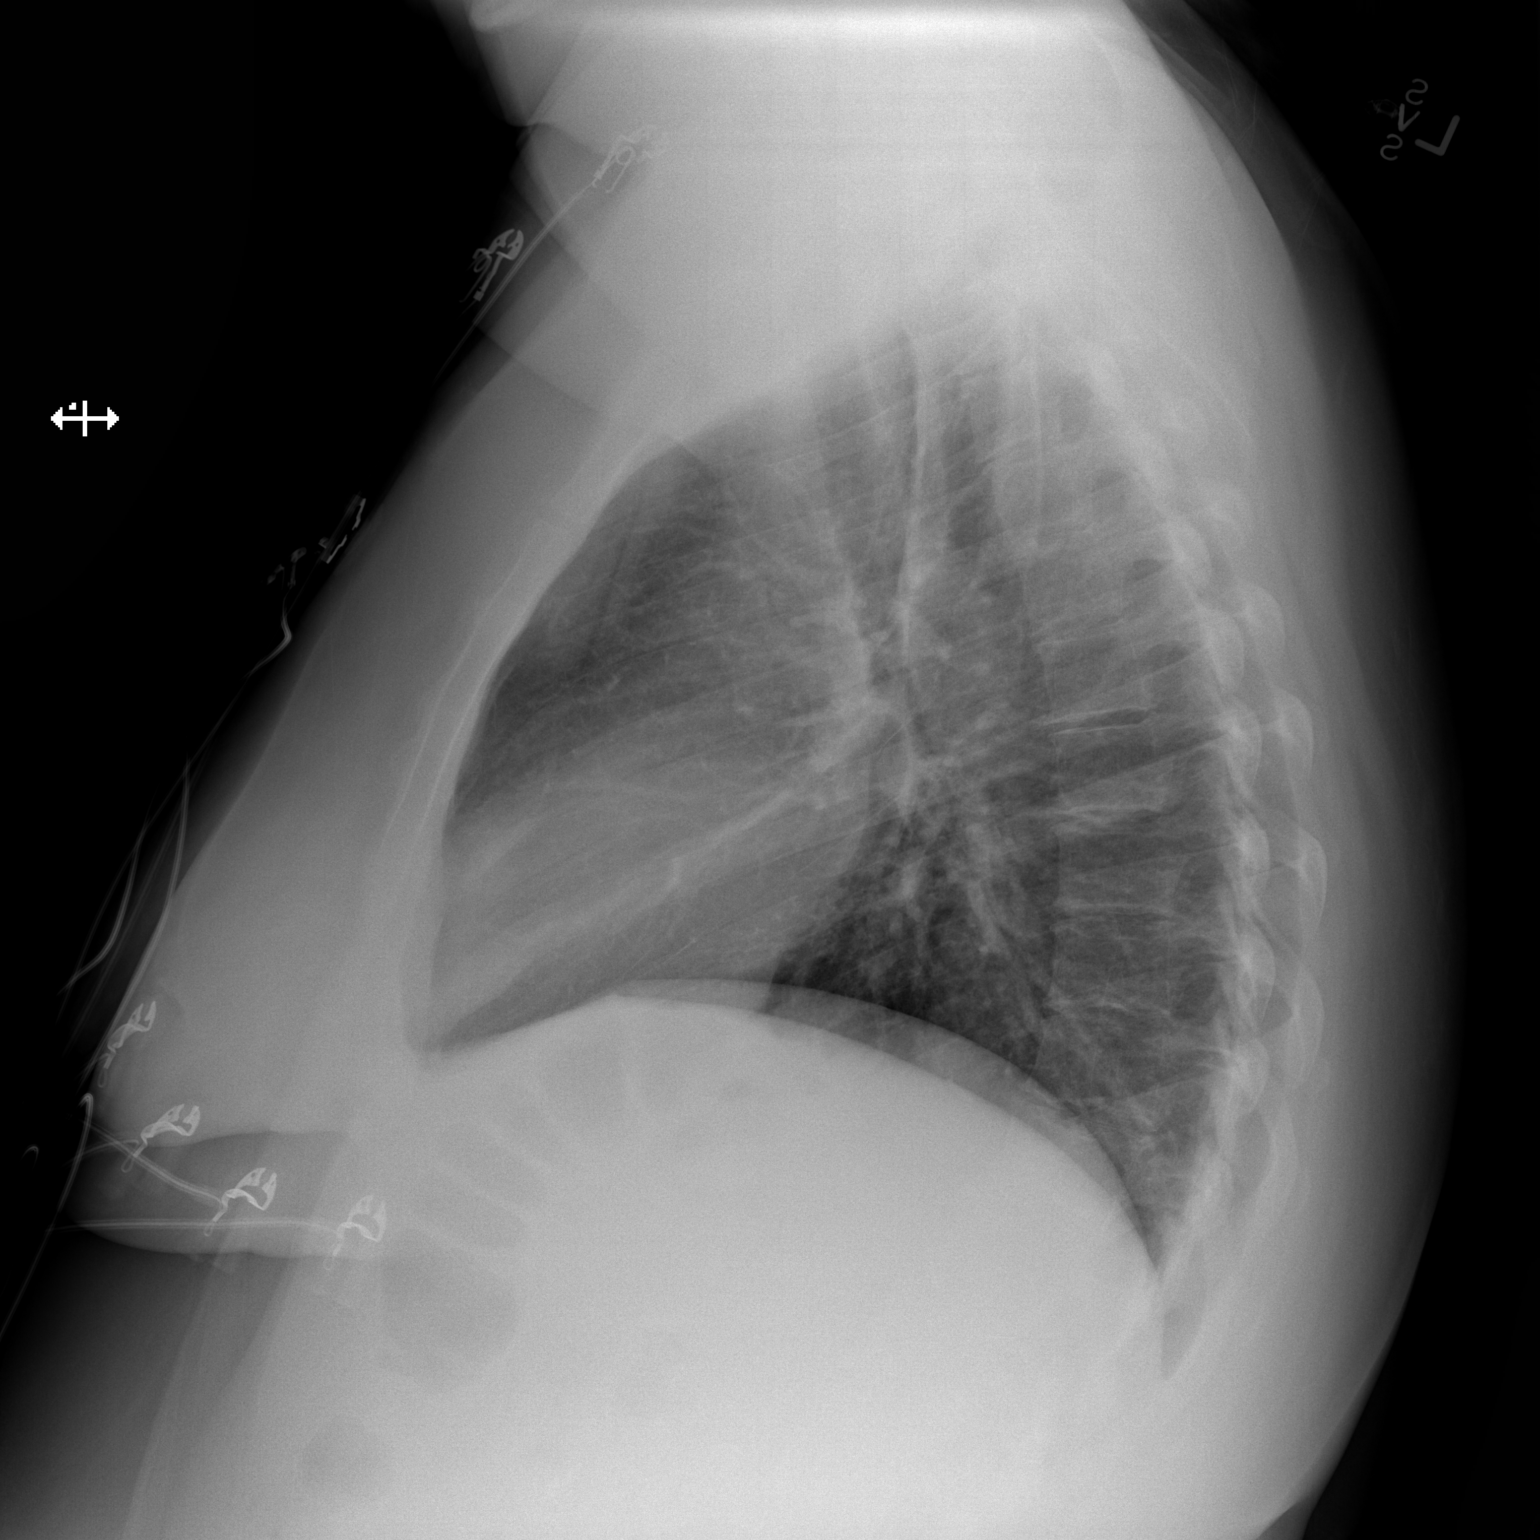

[2 of 2 positions shown; findings below may reference images not displayed]

FINDINGS: The heart size and mediastinal contours are within normal limits.
Both lungs are clear. The visualized skeletal structures are
unremarkable.
IMPRESSION: No active cardiopulmonary disease.

## 2016-01-20 IMAGING — CR DG RIBS W/ CHEST 3+V*R*
4 series · 4 of 4 positions shown · non-contrast
Comparison: Chest radiograph 01/15/2014

CLINICAL DATA: Right-sided pain no injury. Several months of
right-sided pain history of non-Hodgkin's lymphoma.

EXAM:
RIGHT RIBS AND CHEST - 3+ VIEW

[w chest pa]
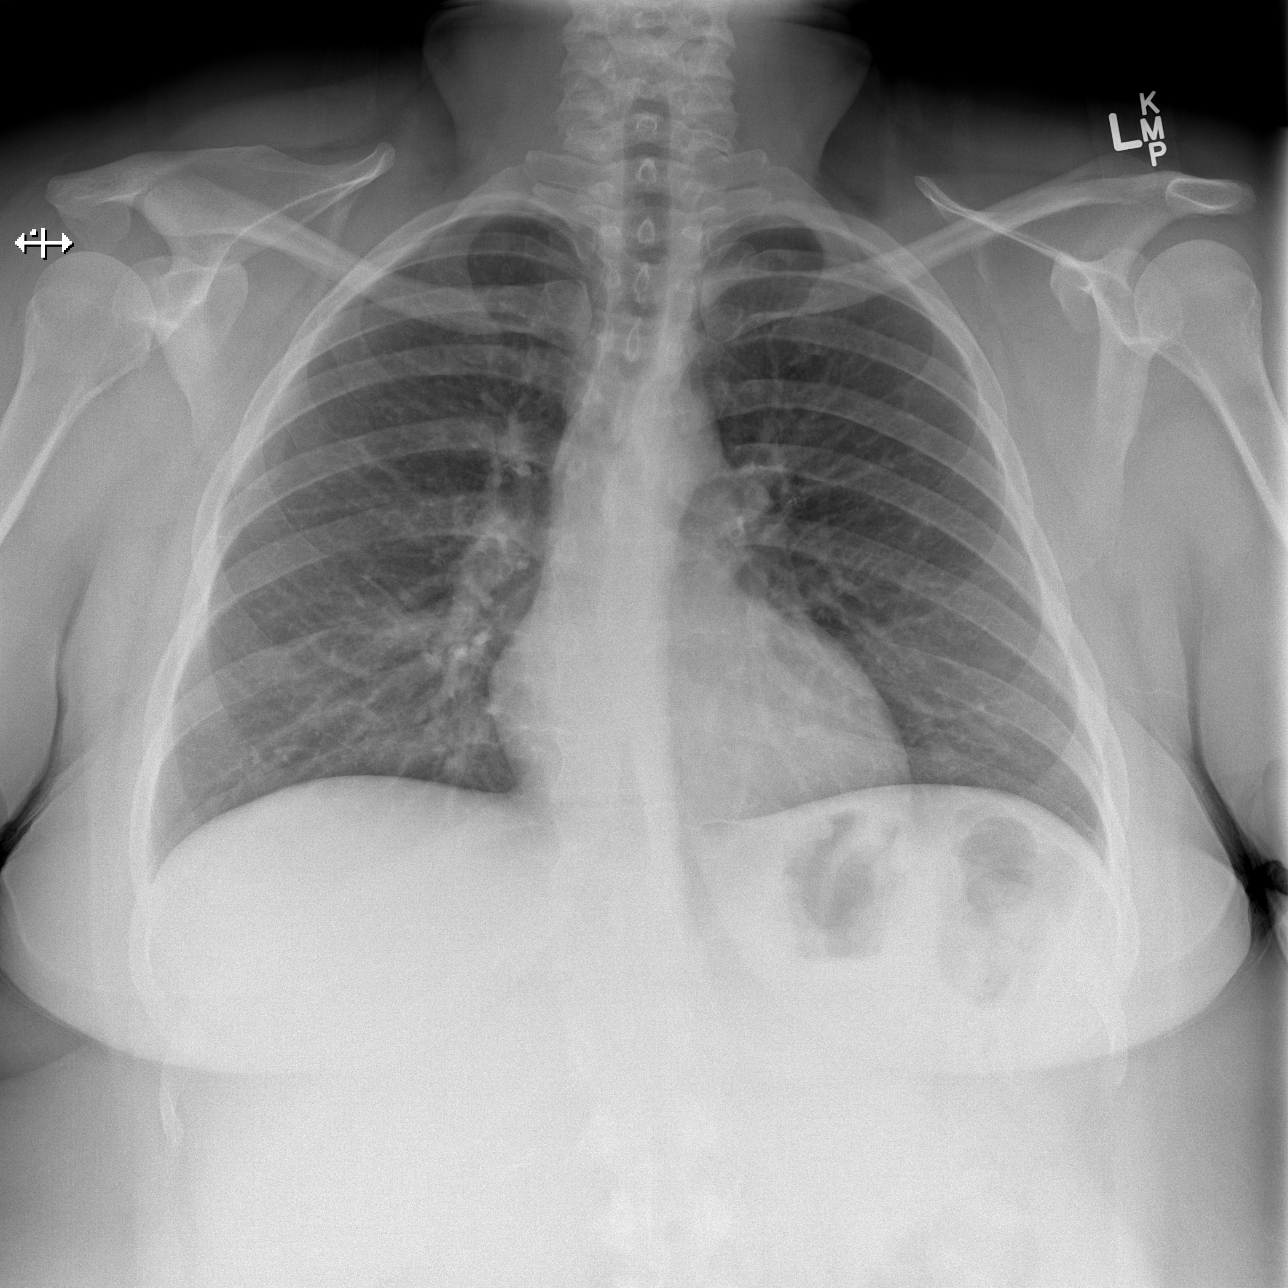

[w ribs ap upper right]
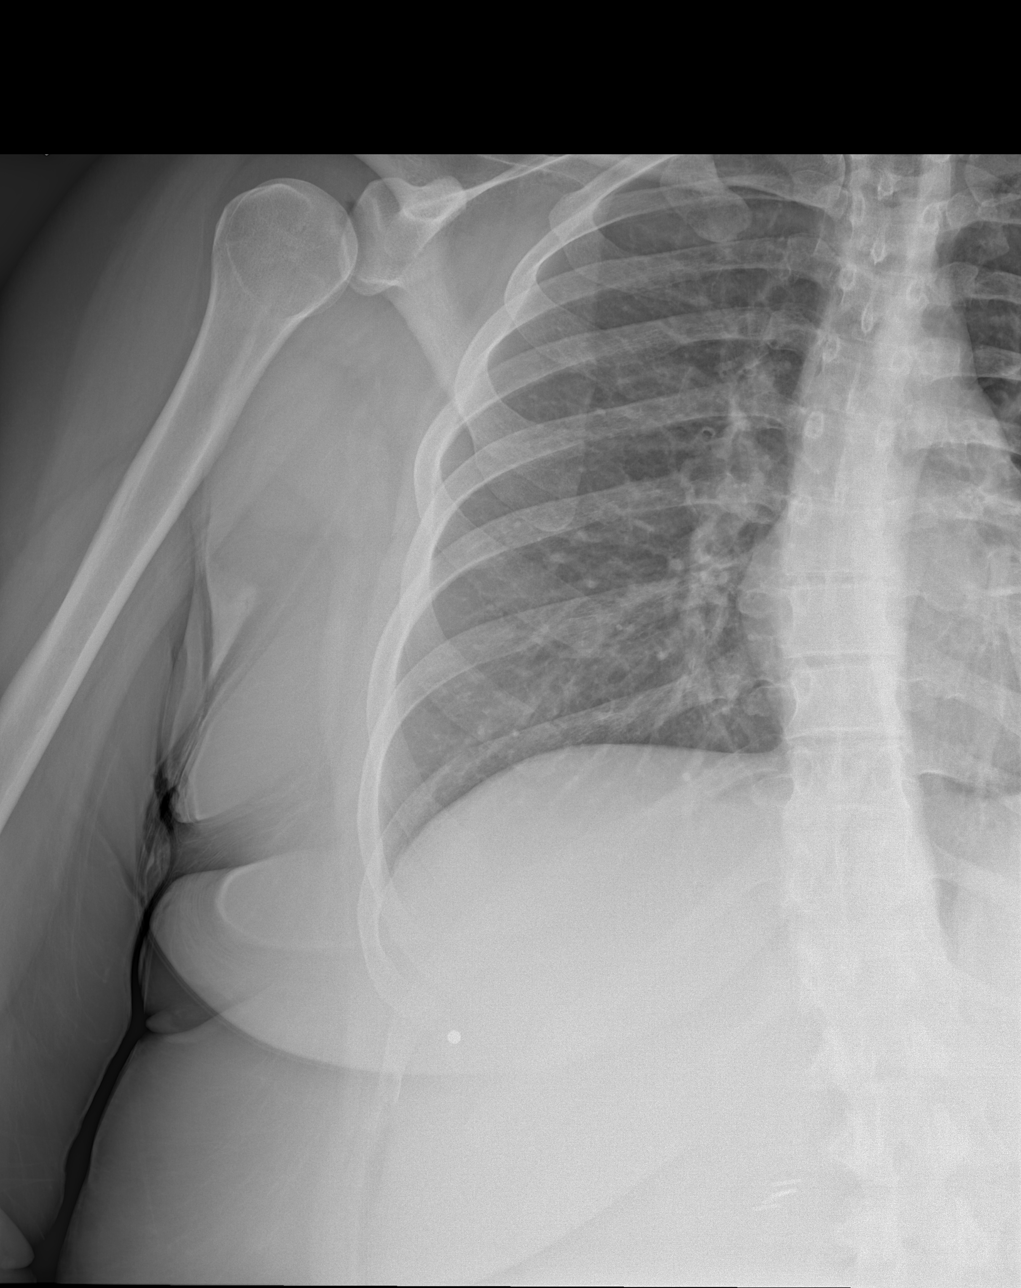

[w ribs ap lower right]
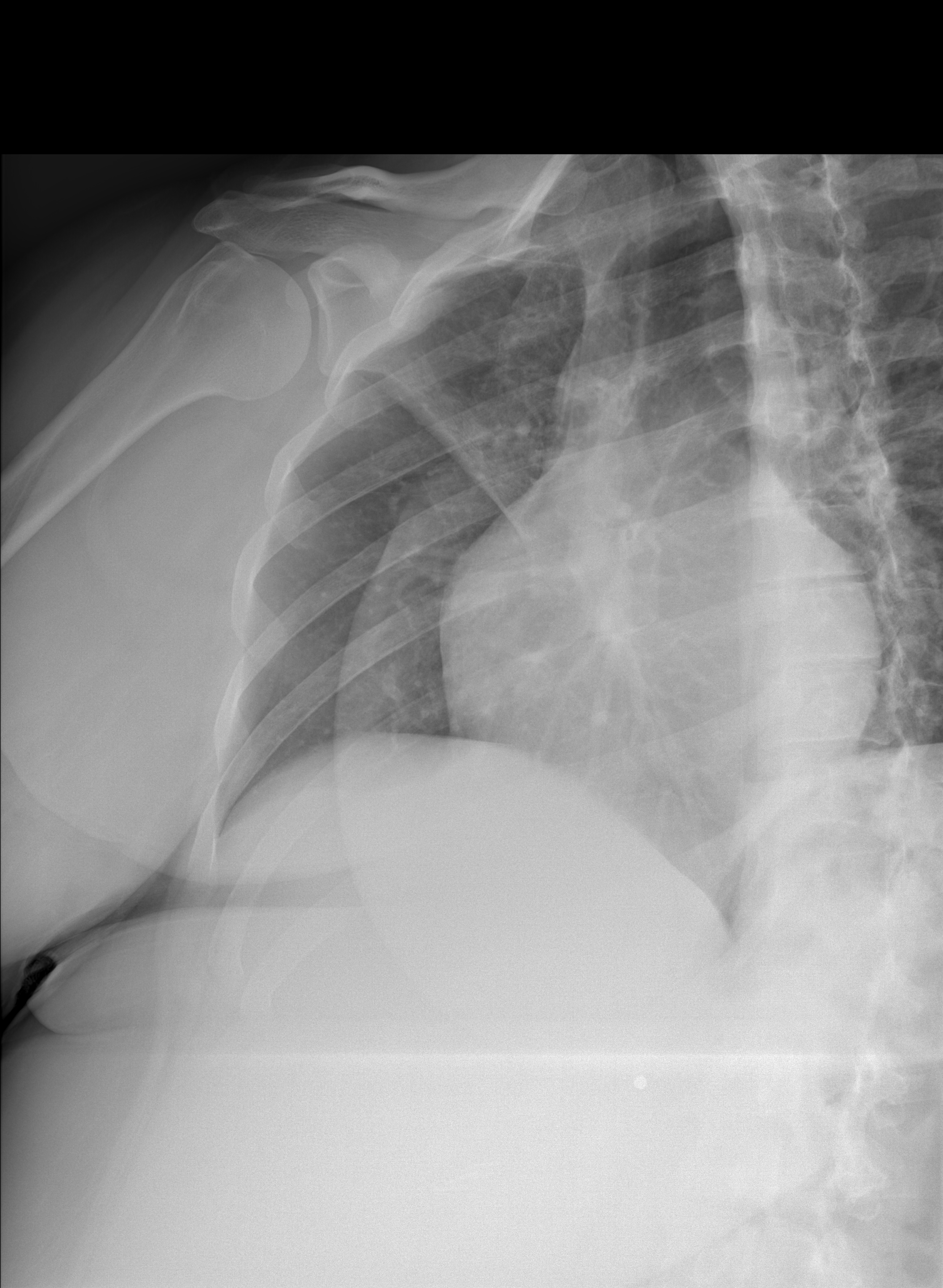

[w ribs obl right]
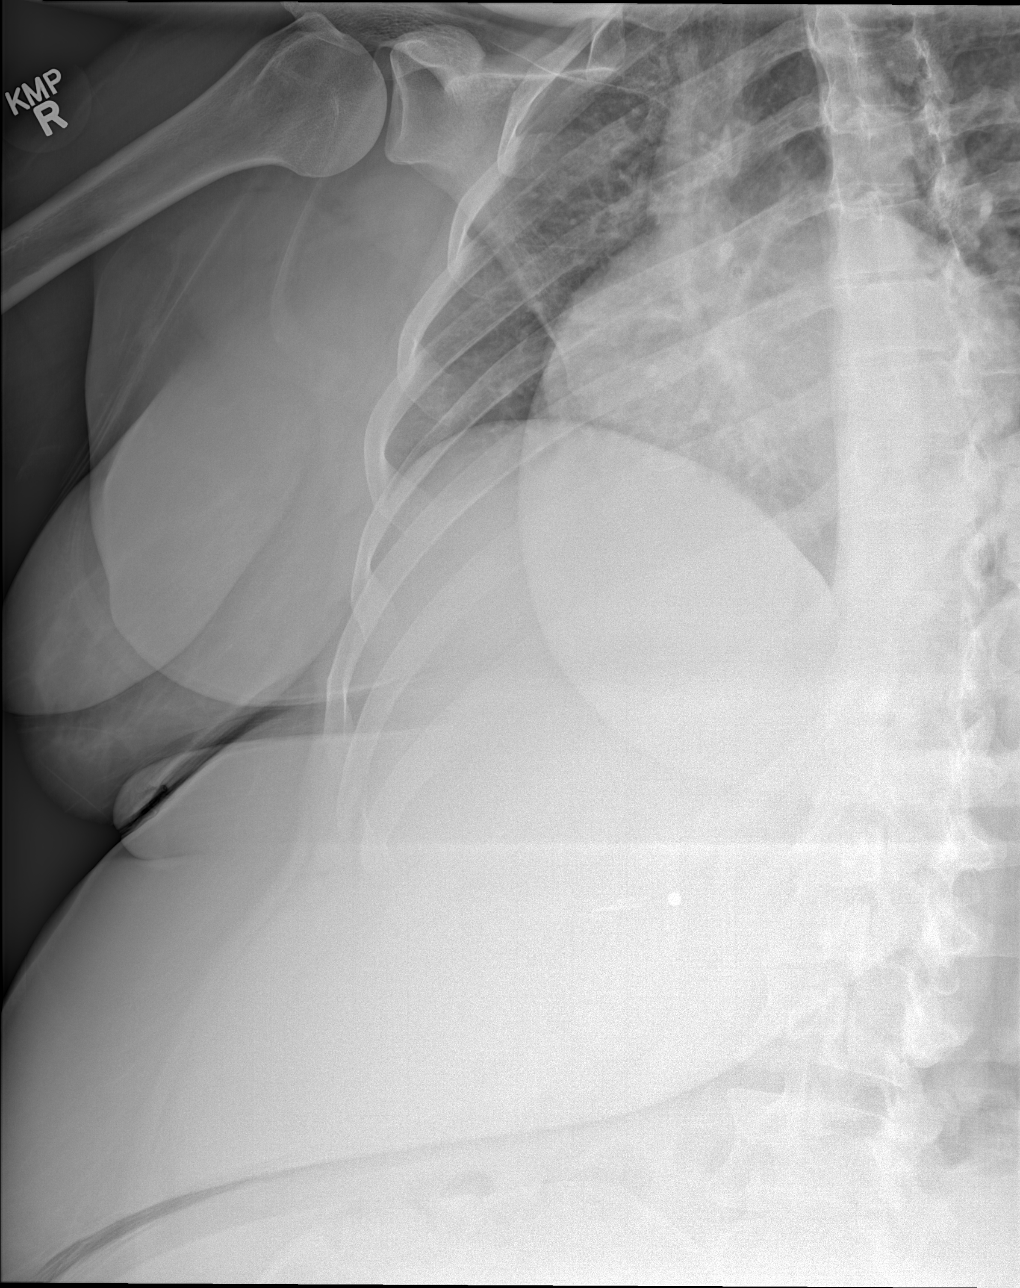

[4 of 4 positions shown; findings below may reference images not displayed]

FINDINGS: Normal mediastinum and cardiac silhouette. Normal pulmonary
vasculature. No evidence of effusion, infiltrate, or pneumothorax.
No acute bony abnormality.

Dedicated views of the right ribs demonstrates no fracture
dislocation.
IMPRESSION: 1.  No acute cardiopulmonary process.
2. No evidence of right rib fracture or abnormality.
